# Patient Record
Sex: Male | Born: 1935 | Race: White | Hispanic: No | Marital: Married | Smoking: Former smoker
Health system: Southern US, Community
[De-identification: ages and names within clinical notes are randomized; demographics above are authoritative.]

## PROBLEM LIST (undated history)

## (undated) DIAGNOSIS — R7611 Nonspecific reaction to tuberculin skin test without active tuberculosis: Secondary | ICD-10-CM

## (undated) DIAGNOSIS — H409 Unspecified glaucoma: Secondary | ICD-10-CM

## (undated) DIAGNOSIS — C801 Malignant (primary) neoplasm, unspecified: Secondary | ICD-10-CM

## (undated) DIAGNOSIS — I10 Essential (primary) hypertension: Secondary | ICD-10-CM

## (undated) DIAGNOSIS — M1990 Unspecified osteoarthritis, unspecified site: Secondary | ICD-10-CM

## (undated) DIAGNOSIS — C679 Malignant neoplasm of bladder, unspecified: Secondary | ICD-10-CM

## (undated) DIAGNOSIS — E785 Hyperlipidemia, unspecified: Secondary | ICD-10-CM

## (undated) DIAGNOSIS — T4145XA Adverse effect of unspecified anesthetic, initial encounter: Secondary | ICD-10-CM

## (undated) DIAGNOSIS — D126 Benign neoplasm of colon, unspecified: Secondary | ICD-10-CM

## (undated) DIAGNOSIS — T8859XA Other complications of anesthesia, initial encounter: Secondary | ICD-10-CM

## (undated) DIAGNOSIS — Z87442 Personal history of urinary calculi: Secondary | ICD-10-CM

## (undated) DIAGNOSIS — Z8551 Personal history of malignant neoplasm of bladder: Secondary | ICD-10-CM

## (undated) DIAGNOSIS — E213 Hyperparathyroidism, unspecified: Secondary | ICD-10-CM

## (undated) DIAGNOSIS — M797 Fibromyalgia: Secondary | ICD-10-CM

## (undated) DIAGNOSIS — K5790 Diverticulosis of intestine, part unspecified, without perforation or abscess without bleeding: Secondary | ICD-10-CM

## (undated) HISTORY — DX: Hyperlipidemia, unspecified: E78.5

## (undated) HISTORY — DX: Unspecified glaucoma: H40.9

## (undated) HISTORY — DX: Diverticulosis of intestine, part unspecified, without perforation or abscess without bleeding: K57.90

## (undated) HISTORY — PX: COLONOSCOPY W/ BIOPSIES: SHX1374

## (undated) HISTORY — DX: Nonspecific reaction to tuberculin skin test without active tuberculosis: R76.11

## (undated) HISTORY — PX: HEMORRHOID BANDING: SHX5850

## (undated) HISTORY — PX: NASAL SINUS SURGERY: SHX719

## (undated) HISTORY — DX: Essential (primary) hypertension: I10

## (undated) HISTORY — DX: Benign neoplasm of colon, unspecified: D12.6

## (undated) HISTORY — DX: Fibromyalgia: M79.7

## (undated) HISTORY — PX: CYST EXCISION: SHX5701

## (undated) HISTORY — DX: Unspecified osteoarthritis, unspecified site: M19.90

---
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
## 1970-03-20 HISTORY — PX: VASECTOMY: SHX75

## 1993-03-20 DIAGNOSIS — M797 Fibromyalgia: Secondary | ICD-10-CM

## 1993-03-20 HISTORY — DX: Fibromyalgia: M79.7

## 2003-03-21 HISTORY — PX: APPENDECTOMY: SHX54

## 2004-02-02 ENCOUNTER — Ambulatory Visit: Payer: Self-pay | Admitting: Gastroenterology

## 2004-02-16 ENCOUNTER — Ambulatory Visit: Payer: Self-pay | Admitting: Gastroenterology

## 2004-04-25 ENCOUNTER — Ambulatory Visit: Payer: Self-pay | Admitting: General Surgery

## 2009-08-26 ENCOUNTER — Encounter (INDEPENDENT_AMBULATORY_CARE_PROVIDER_SITE_OTHER): Payer: Self-pay | Admitting: *Deleted

## 2009-08-31 ENCOUNTER — Ambulatory Visit: Payer: Self-pay | Admitting: Internal Medicine

## 2009-09-14 ENCOUNTER — Ambulatory Visit: Payer: Self-pay | Admitting: Internal Medicine

## 2010-03-20 HISTORY — PX: CATARACT EXTRACTION, BILATERAL: SHX1313

## 2010-04-20 NOTE — Procedures (Signed)
Summary: Colonoscopy  Patient: [name] Note: All result statuses are Final unless otherwise noted.  Tests: (1) Colonoscopy (COL)   COL Colonoscopy           DONE     [facility]     [address]     [place], [place]  [address]           COLONOSCOPY PROCEDURE REPORT           PATIENT:  Aaron Quinn, Aaron Quinn  MR#:  [id number]     BIRTHDATE:  23-Dec-1931, 77 yrs. old  GENDER:  male     ENDOSCOPIST:  Iva Boop, MD, [facility]           PROCEDURE DATE:  09/14/2009     PROCEDURE:  Colonoscopy 19[address]     ASA CLASS:  Class II     INDICATIONS:  surveillance and high-risk screening, history of     pre-cancerous (adenomatous) colon polyps first adenoma (1.5 cm)     1985, subsequent adenomas 2003, none in 2005           maternal grandmother with colon cancer     MEDICATIONS:   Fentanyl 50 mcg IV, Versed 5 mg IV           DESCRIPTION OF PROCEDURE:   After the risks benefits and     alternatives of the procedure were thoroughly explained, informed     consent was obtained.  Digital rectal exam was performed and     revealed no abnormalities and normal prostate.   The LB CF-H180AL     [id number] endoscope was introduced through the anus and advanced to     the cecum, which was identified by both the appendix and ileocecal     valve, without limitations.  The quality of the prep was     excellent, using MoviPrep.  The instrument was then slowly     withdrawn as the colon was fully examined.     Insertion: 3:52 minutes withdrawal: 8:40 minutes     <<PROCEDUREIMAGES>>           FINDINGS:  Moderate diverticulosis was found in the sigmoid colon.     This was otherwise a normal examination of the colon. This     includes right colon retroflexion.   Retroflexed views in the     rectum revealed internal hemorrhoids.    The scope was then     withdrawn from the patient and the procedure completed.           COMPLICATIONS:  None     ENDOSCOPIC IMPRESSION:     1) Moderate diverticulosis in  the sigmoid colon     2) Internal hemorrhoids     3) Otherwise normal examination, excellent prep     4) Personal history of adenoma removal 1985 and 2003 (at least)           REPEAT EXAM:  In 5 year(s) for routine screening colonocsopy.           Iva Boop, MD, [facility]           CC:  Loma Sender, MD     The Patient           n.     eSIGNED:   Iva Boop at 09/14/2009 09:00 AM           Nicanor Bake, [id number]  Note: An exclamation mark (!) indicates a result that was not dispersed into the  flowsheet. Document Creation Date: 09/14/2009 9:01 AM _______________________________________________________________________  (1) Order result status: Final Collection or observation date-time: 09/14/2009 08:51 Requested date-time:  Receipt date-time:  Reported date-time:  Referring Physician:   Ordering Physician: Stan Head [phone]) Specimen Source:  Source: Launa Grill Order Number: [id number] Lab site:   Appended Document: Colonoscopy     Procedures Next Due Date:    Colonoscopy: 08/2014

## 2010-04-20 NOTE — Miscellaneous (Signed)
Summary: [facility]  Clinical Lists Changes  Medications: Added new medication of MOVIPREP 100 GM  SOLR (PEG-KCL-NACL-NASULF-NA ASC-C) As per prep instructions. - Signed Rx of MOVIPREP 100 GM  SOLR (PEG-KCL-NACL-NASULF-NA ASC-C) As per prep instructions.;  #1 x 0;  Signed;  Entered by: Ezra Sites RN;  Authorized by: Iva Boop MD, [facility];  Method used: Electronically to [id number] [address], [place], [place]  [address], Ph: [phone], Fax: [phone] Observations: Added new observation of NKA: T (08/31/2009 12:49)    Prescriptions: MOVIPREP 100 GM  SOLR (PEG-KCL-NACL-NASULF-NA ASC-C) As per prep instructions.  #1 x 0   Entered by:   Ezra Sites RN   Authorized by:   Iva Boop MD, [facility]   Signed by:   Ezra Sites RN on 08/31/2009   Method used:   Electronically to        [id number] ([facility])       [address]       [place], [place]  [address]       Ph: [phone]       Fax: [phone]   RxID:   [id number]

## 2010-04-20 NOTE — Letter (Signed)
Summary: Aaron Quinn [facility] Instructions  [facility]  [address] [place], [place] [address]   Phone: [phone]  Fax: [phone]       Aaron Quinn    [birth year]    MRN: [id number]        Procedure Day [name]:  [name]  09/14/09     Arrival Time:  7:30AM      Procedure Time:  8:30AM     Location of Procedure:                    [name] (4th Floor)                       PREPARATION FOR COLONOSCOPY WITH MOVIPREP   Starting 5 days prior to your procedure 09/09/09 do not eat nuts, seeds, popcorn, corn, beans, peas,  salads, or any raw vegetables.  Do not take any fiber supplements (e.g. Metamucil, Citrucel, and Benefiber).  THE DAY BEFORE YOUR PROCEDURE         DATE: 09/13/09  DAY: MONDAY  1.  Drink clear liquids the entire day-NO SOLID FOOD  2.  Do not drink anything colored red or purple.  Avoid juices with pulp.  No orange juice.  3.  Drink at least 64 oz. (8 glasses) of fluid/clear liquids during the day to prevent dehydration and help the prep work efficiently.  CLEAR LIQUIDS INCLUDE: Water Jello Ice Popsicles Tea (sugar ok, no milk/cream) Powdered fruit flavored drinks Coffee (sugar ok, no milk/cream) Gatorade Juice: apple, white grape, white cranberry  Lemonade Clear bullion, consomm, broth Carbonated beverages (any kind) Strained chicken noodle soup Hard Candy                             4.  In the morning, mix first dose of MoviPrep solution:    Empty 1 Pouch A and 1 Pouch B into the disposable container    Add lukewarm drinking water to the top line of the container. Mix to dissolve    Refrigerate (mixed solution should be used within 24 hrs)  5.  Begin drinking the prep at 5:00 p.m. The MoviPrep container is divided by 4 marks.   Every 15 minutes drink the solution down to the next mark (approximately 8 oz) until the full liter is complete.   6.  Follow completed prep with 16 oz of clear liquid of your choice (Nothing  red or purple).  Continue to drink clear liquids until bedtime.  7.  Before going to bed, mix second dose of MoviPrep solution:    Empty 1 Pouch A and 1 Pouch B into the disposable container    Add lukewarm drinking water to the top line of the container. Mix to dissolve    Refrigerate  THE DAY OF YOUR PROCEDURE      DATE: 09/14/09  DAY: TUESDAY  Beginning at 3:30AM (5 hours before procedure):         1. Every 15 minutes, drink the solution down to the next mark (approx 8 oz) until the full liter is complete.  2. Follow completed prep with 16 oz. of clear liquid of your choice.    3. You may drink clear liquids until 6:30AM (2 HOURS BEFORE PROCEDURE).   MEDICATION INSTRUCTIONS  Unless otherwise instructed, you should take regular prescription medications with a small sip of water   as early as possible the morning  of your procedure.           OTHER INSTRUCTIONS  You will need a responsible adult at least 75 years of age to accompany you and drive you home.   This person must remain in the waiting room during your procedure.  Wear loose fitting clothing that is easily removed.  Leave jewelry and other valuables at home.  However, you may wish to bring a book to read or  an iPod/MP3 player to listen to music as you wait for your procedure to start.  Remove all body piercing jewelry and leave at home.  Total time from sign-in until discharge is approximately 2-3 hours.  You should go home directly after your procedure and rest.  You can resume normal activities the  day after your procedure.  The day of your procedure you should not:   Drive   Make legal decisions   Operate machinery   Drink alcohol   Return to work  You will receive specific instructions about eating, activities and medications before you leave.    The above instructions have been reviewed and explained to me by   Ezra Sites RN  August 31, 2009 1:28 PM    I fully understand and can  verbalize these instructions _____________________________ Date _________

## 2010-07-19 ENCOUNTER — Ambulatory Visit: Payer: Self-pay | Admitting: Ophthalmology

## 2010-07-25 ENCOUNTER — Ambulatory Visit: Payer: Self-pay | Admitting: Ophthalmology

## 2010-09-19 ENCOUNTER — Ambulatory Visit: Payer: Self-pay | Admitting: Ophthalmology

## 2011-03-16 ENCOUNTER — Ambulatory Visit: Payer: Self-pay | Admitting: Unknown Physician Specialty

## 2011-03-31 ENCOUNTER — Ambulatory Visit: Payer: Self-pay | Admitting: Unknown Physician Specialty

## 2011-05-24 ENCOUNTER — Encounter: Payer: Self-pay | Admitting: Unknown Physician Specialty

## 2011-06-19 ENCOUNTER — Encounter: Payer: Self-pay | Admitting: Unknown Physician Specialty

## 2012-03-20 HISTORY — PX: KNEE ARTHROSCOPY: SUR90

## 2013-02-02 ENCOUNTER — Emergency Department: Payer: Self-pay | Admitting: Emergency Medicine

## 2013-02-02 LAB — CBC WITH DIFFERENTIAL/PLATELET
Basophil %: 0.5 %
HCT: 46.9 % (ref 40.0–52.0)
HGB: 16 g/dL (ref 13.0–18.0)
Lymphocyte %: 6.5 %
MCH: 31.1 pg (ref 26.0–34.0)
MCHC: 34 g/dL (ref 32.0–36.0)
Neutrophil %: 88 %
WBC: 13.5 10*3/uL — ABNORMAL HIGH (ref 3.8–10.6)

## 2013-02-02 LAB — BASIC METABOLIC PANEL
Anion Gap: 7 (ref 7–16)
Co2: 29 mmol/L (ref 21–32)
EGFR (African American): >60
EGFR (Non-African Amer.): >60
Glucose: 104 mg/dL — ABNORMAL HIGH (ref 65–99)
Osmolality: 279 (ref 275–301)
Potassium: 3.7 mmol/L (ref 3.5–5.1)
Sodium: 139 mmol/L (ref 136–145)

## 2013-02-02 LAB — URINALYSIS, COMPLETE
Bilirubin,UR: NEGATIVE
Blood: NEGATIVE
Glucose,UR: NEGATIVE mg/dL (ref 0–75)
Protein: NEGATIVE
WBC UR: 1 /HPF (ref 0–5)

## 2013-04-13 ENCOUNTER — Other Ambulatory Visit: Payer: Self-pay | Admitting: Podiatry

## 2013-12-16 DIAGNOSIS — G479 Sleep disorder, unspecified: Secondary | ICD-10-CM | POA: Insufficient documentation

## 2014-01-02 ENCOUNTER — Other Ambulatory Visit: Payer: Self-pay | Admitting: Podiatry

## 2014-01-27 ENCOUNTER — Other Ambulatory Visit: Payer: Self-pay | Admitting: *Deleted

## 2014-01-27 MED ORDER — ALLOPURINOL 300 MG PO TABS: 300.0000 mg | ORAL_TABLET | Freq: Every day | ORAL | Status: DC

## 2014-01-27 NOTE — Telephone Encounter (Signed)
[facility] sent refill for allopurinol tabs #30 with 3 refills one by mouth daily. Per dr Paulla Dolly refill.

## 2014-06-09 ENCOUNTER — Encounter: Payer: Self-pay | Admitting: Internal Medicine

## 2014-08-12 ENCOUNTER — Ambulatory Visit (INDEPENDENT_AMBULATORY_CARE_PROVIDER_SITE_OTHER): Payer: Medicare Other | Admitting: Internal Medicine

## 2014-08-12 ENCOUNTER — Encounter: Payer: Self-pay | Admitting: Internal Medicine

## 2014-08-12 VITALS — BP 130/68 | HR 76 | Ht 71.0 in | Wt 173.1 lb

## 2014-08-12 DIAGNOSIS — K648 Other hemorrhoids: Secondary | ICD-10-CM | POA: Diagnosis not present

## 2014-08-12 DIAGNOSIS — R195 Other fecal abnormalities: Secondary | ICD-10-CM

## 2014-08-12 MED ORDER — RANITIDINE HCL 150 MG PO TABS: 150.0000 mg | ORAL_TABLET | Freq: Every day | ORAL | Status: DC

## 2014-08-12 NOTE — Progress Notes (Signed)
   Subjective:    Patient ID: Aaron Quinn, male    DOB: 28-Jan-1932, 81 y.o.   MRN: [id number] Cc: heme + stool HPI Elderly wm w/ hx colon polyps but none 2011 exam. Has been  Heme + DRE.  Has hemorrhoids that swell and burn and sees blood on toilet paper about 1x/week. No Known Allergies Outpatient Prescriptions Prior to Visit  Medication Sig Dispense Refill  . allopurinol (ZYLOPRIM) 300 MG tablet Take 1 tablet (300 mg total) by mouth daily. 90 tablet 3   No facility-administered medications prior to visit.   Past Medical History  Diagnosis Date  . Diverticulosis   . Tubulovillous adenoma of colon   . Fibromyalgia 1995   Past Surgical History  Procedure Laterality Date  . Colonoscopy w/ biopsies     History   Social History  . Marital Status: Married    Spouse Name: N/A  . Number of Children: 2  . Years of Education: N/A   Occupational History  . Retired    Social History Main Topics  . Smoking status: Former Smoker -- 0.50 packs/day for 20 years    Types: Cigarettes    Quit date: 03/20/1982  . Smokeless tobacco: Never Used  . Alcohol Use: No  . Drug Use: No  . Sexual Activity: Not on file   Other Topics Concern  . None   Social History Narrative   Retired from [employer]   Family History  Problem Relation Age of Onset  . Colon cancer Maternal Grandmother 35    Died at 80  . Colon polyps Neg Hx   . Diabetes Neg Hx   . Kidney disease Neg Hx   . Esophageal cancer Neg Hx   . Gallbladder disease Neg Hx   . Heart disease Father        Review of Systems Functional at his age - overall vigorous.     Objective:   Physical Exam BP 130/68 mmHg  Pulse 76  Ht 5\' 11"  (1.803 m)  Wt 173 lb 2 oz (78.529 kg)  BMI 24.16 kg/m2  Rectal : + tags, no mass, NL canal, firm brown stool  Anoscopy: Gr 2 internal hemorrhoids all 3 positions          Assessment & Plan:  Heme positive stool  Hemorrhoids, internal, with bleeding   PROCEDURE  NOTE: The patient presents with symptomatic grade 2 hemorrhoids, requesting rubber band ligation of his/her hemorrhoidal disease.  All risks, benefits and alternative forms of therapy were described and informed consent was obtained.    The decision was made to band the RA internal hemorrhoid, and the [place] was used to perform band ligation without complication.  Digital anorectal examination was then performed to assure proper positioning of the band, and to adjust the banded tissue as required.  The patient was discharged home without pain or other issues.  Dietary and behavioral recommendations were given and along with follow-up instructions.     The following adjunctive treatments were recommended:  Benefiber 2 tbsp/day  The patient will return in June for  follow-up and possible additional banding as required. No complications were encountered and the patient tolerated the procedure well.  I think hemorrhoids most likely cause of heme + stool. Plan to treat the hemorrhoids and then consider next steps. 89 is getting old for a colonoscopy but he is still vigorous. Consider Cologuard testing.  TK:[id number] Lysbeth Galas, MD

## 2014-08-12 NOTE — Patient Instructions (Addendum)
   Stop meloxicam. If ok without it stay off. If you think you need it take omeprazole 20 mg or lansoprazole 15 mg daily with it to reduce risk of ulcer.  HEMORRHOID BANDING PROCEDURE    FOLLOW-UP CARE   1. The procedure you have had should have been relatively painless since the banding of the area involved does not have nerve endings and there is no pain sensation.  The rubber band cuts off the blood supply to the hemorrhoid and the band may fall off as soon as 48 hours after the banding (the band may occasionally be seen in the toilet bowl following a bowel movement). You may notice a temporary feeling of fullness in the rectum which should respond adequately to plain Tylenol or Motrin.  2. Following the banding, avoid strenuous exercise that evening and resume full activity the next day.  A sitz bath (soaking in a warm tub) or bidet is soothing, and can be useful for cleansing the area after bowel movements.     3. To avoid constipation, take two tablespoons of natural wheat bran, natural oat bran, flax, Benefiber or any over the counter fiber supplement and increase your water intake to 7-8 glasses daily.    4. Unless you have been prescribed anorectal medication, do not put anything inside your rectum for two weeks: No suppositories, enemas, fingers, etc.  5. Occasionally, you may have more bleeding than usual after the banding procedure.  This is often from the untreated hemorrhoids rather than the treated one.  Don't be concerned if there is a tablespoon or so of blood.  If there is more blood than this, lie flat with your bottom higher than your head and apply an ice pack to the area. If the bleeding does not stop within a half an hour or if you feel faint, call our office at [phone] or go to the emergency room.  6. Problems are not common; however, if there is a substantial amount of bleeding, severe pain, chills, fever or difficulty passing urine (very rare) or other  problems, you should call us at [phone] or report to the nearest emergency room.  7. Do not stay seated continuously for more than 2-3 hours for a day or two after the procedure.  Tighten your buttock muscles 10-15 times every two hours and take 10-15 deep breaths every 1-2 hours.  Do not spend more than a few minutes on the toilet if you cannot empty your bowel; instead re-visit the toilet at a later time.    Please use Benefiber daily, handout provided.  Follow up with Dr. Carlean Purl 09/02/14 at 9:15AM.   I appreciate the opportunity to care for you. Silvano Rusk, M.D., [facility]

## 2014-08-15 NOTE — Assessment & Plan Note (Signed)
RA banded - RTC June for reassessment and banding

## 2014-09-02 ENCOUNTER — Encounter: Payer: Self-pay | Admitting: Internal Medicine

## 2014-09-02 ENCOUNTER — Ambulatory Visit (INDEPENDENT_AMBULATORY_CARE_PROVIDER_SITE_OTHER): Payer: Medicare Other | Admitting: Internal Medicine

## 2014-09-02 VITALS — BP 136/80 | HR 84 | Ht 71.0 in | Wt 176.0 lb

## 2014-09-02 DIAGNOSIS — K648 Other hemorrhoids: Secondary | ICD-10-CM | POA: Diagnosis not present

## 2014-09-02 NOTE — Progress Notes (Signed)
   Improved sxs - easier to cleanse, easier defecation  Desires 2 bands today  PROCEDURE NOTE: The patient presents with symptomatic grade  2 hemorrhoids, requesting rubber band ligation of his/her hemorrhoidal disease.  All risks, benefits and alternative forms of therapy were described and informed consent was obtained.   The anorectum was pre-medicated with 0.125% NTG and 5% lidocaine The decision was made to band the RP and LL internal hemorrhoid, and the [place] was used to perform band ligation without complication.  Digital anorectal examination was then performed to assure proper positioning of the band, and to adjust the banded tissue as required.  The patient was discharged home without pain or other issues.  Dietary and behavioral recommendations were given and along with follow-up instructions.     The following adjunctive treatments were recommended:  Continue Benefiber  The patient will return in 2 months for  follow-up and possible additional banding as required. No complications were encountered and the patient tolerated the procedure well.  Consider Cologuard vs colonoscopy when he returns

## 2014-09-02 NOTE — Assessment & Plan Note (Signed)
RP and LL banded Continue Benefiber RTC 2 months and consider colon evaluation with Cologuard vs colonoscopy

## 2014-09-02 NOTE — Patient Instructions (Signed)
HEMORRHOID BANDING PROCEDURE    FOLLOW-UP CARE   1. The procedure you have had should have been relatively painless since the banding of the area involved does not have nerve endings and there is no pain sensation.  The rubber band cuts off the blood supply to the hemorrhoid and the band may fall off as soon as 48 hours after the banding (the band may occasionally be seen in the toilet bowl following a bowel movement). You may notice a temporary feeling of fullness in the rectum which should respond adequately to plain Tylenol or Motrin.  2. Following the banding, avoid strenuous exercise that evening and resume full activity the next day.  A sitz bath (soaking in a warm tub) or bidet is soothing, and can be useful for cleansing the area after bowel movements.     3. To avoid constipation, take two tablespoons of natural wheat bran, natural oat bran, flax, Benefiber or any over the counter fiber supplement and increase your water intake to 7-8 glasses daily.    4. Unless you have been prescribed anorectal medication, do not put anything inside your rectum for two weeks: No suppositories, enemas, fingers, etc.  5. Occasionally, you may have more bleeding than usual after the banding procedure.  This is often from the untreated hemorrhoids rather than the treated one.  Don't be concerned if there is a tablespoon or so of blood.  If there is more blood than this, lie flat with your bottom higher than your head and apply an ice pack to the area. If the bleeding does not stop within a half an hour or if you feel faint, call our office at [phone] or go to the emergency room.  6. Problems are not common; however, if there is a substantial amount of bleeding, severe pain, chills, fever or difficulty passing urine (very rare) or other problems, you should call us at [phone] or report to the nearest emergency room.  7. Do not stay seated continuously for more than 2-3 hours for a day or two  after the procedure.  Tighten your buttock muscles 10-15 times every two hours and take 10-15 deep breaths every 1-2 hours.  Do not spend more than a few minutes on the toilet if you cannot empty your bowel; instead re-visit the toilet at a later time.    Follow up with Dr Gessner in 2 months.   I appreciate the opportunity to care for you. Carl Gessner, MD, FACG 

## 2014-11-04 ENCOUNTER — Ambulatory Visit (INDEPENDENT_AMBULATORY_CARE_PROVIDER_SITE_OTHER): Payer: Medicare Other | Admitting: Internal Medicine

## 2014-11-04 ENCOUNTER — Encounter: Payer: Self-pay | Admitting: Internal Medicine

## 2014-11-04 VITALS — BP 118/60 | HR 80 | Ht 71.0 in | Wt 171.0 lb

## 2014-11-04 DIAGNOSIS — K648 Other hemorrhoids: Secondary | ICD-10-CM

## 2014-11-04 DIAGNOSIS — Z1211 Encounter for screening for malignant neoplasm of colon: Secondary | ICD-10-CM | POA: Diagnosis not present

## 2014-11-04 NOTE — Progress Notes (Signed)
   Subjective:    Patient ID: Aaron Quinn, male    DOB: 1931-12-25, 81 y.o.   MRN: [id number] Cc: f/u hemorrhoids and discuss colon cancer screenig HPI  No further hemorrhoid problems, much easier to wipe and cleanse after defecation. To discuss CRCA screening Medications, allergies, past medical history, past surgical history, family history and social history are reviewed and updated in the EMR.   Review of Systems As above    Objective:   Physical Exam  BP 118/60 mmHg  Pulse 80  Ht 5\' 11"  (1.803 m)  Wt 171 lb (77.565 kg)  BMI 23.86 kg/m2 NAD, younger than stated    Assessment & Plan:   Hemorrhoids, internal, with bleeding  Colon cancer screening  1) Has decided to pursue Cologuard testing 2) F/U prnhemorrhoids  [id number] Lysbeth Galas, MD

## 2014-11-04 NOTE — Patient Instructions (Signed)
Today we are ordering a cologuard test for you, we are providing you with handouts and they will contact you about shipping the kit to you.   I appreciate the opportunity to care for you. Silvano Rusk, MD, [facility]

## 2014-11-04 NOTE — Assessment & Plan Note (Addendum)
Sx resolved

## 2014-11-05 ENCOUNTER — Encounter: Payer: Self-pay | Admitting: Internal Medicine

## 2014-11-10 ENCOUNTER — Encounter: Payer: Self-pay | Admitting: Internal Medicine

## 2014-11-19 LAB — COLOGUARD

## 2014-11-24 ENCOUNTER — Encounter: Payer: Self-pay | Admitting: Internal Medicine

## 2015-02-17 ENCOUNTER — Other Ambulatory Visit: Payer: Self-pay | Admitting: Podiatry

## 2015-03-03 DIAGNOSIS — I10 Essential (primary) hypertension: Secondary | ICD-10-CM | POA: Insufficient documentation

## 2015-03-09 ENCOUNTER — Encounter: Payer: Self-pay | Admitting: Family Medicine

## 2015-03-09 ENCOUNTER — Ambulatory Visit (INDEPENDENT_AMBULATORY_CARE_PROVIDER_SITE_OTHER): Payer: Medicare Other | Admitting: Family Medicine

## 2015-03-09 VITALS — BP 126/84 | HR 89 | Temp 98.0°F | Ht 71.0 in

## 2015-03-09 DIAGNOSIS — R42 Dizziness and giddiness: Secondary | ICD-10-CM | POA: Diagnosis not present

## 2015-03-09 DIAGNOSIS — I10 Essential (primary) hypertension: Secondary | ICD-10-CM | POA: Insufficient documentation

## 2015-03-09 DIAGNOSIS — R413 Other amnesia: Secondary | ICD-10-CM | POA: Diagnosis not present

## 2015-03-09 DIAGNOSIS — M797 Fibromyalgia: Secondary | ICD-10-CM | POA: Diagnosis not present

## 2015-03-09 NOTE — Progress Notes (Signed)
Pre visit review using our clinic review tool, if applicable. No additional management support is needed unless otherwise documented below in the visit note. 

## 2015-03-09 NOTE — Patient Instructions (Signed)
Nice to meet you.   please continue current medication regimen  please monitor the lightheadedness. If this gets worse or he develop other symptoms please let us know.  please monitor  Your blood pressure.  If you develop lightheadedness, dizziness, chest pain, shortness of breath, palpitations, numbness, weakness, increasing pain, or any new or changing symptoms please seek medical attention.

## 2015-03-09 NOTE — Assessment & Plan Note (Signed)
Suspect this is likely orthostatic in nature given that it mostly occurs when he goes from rising upright. Orthostatics today were negative. Could potentially also be related to medications that he is on for his fibromyalgia. I discussed this with him. No associated cardiac symptoms. He would like to continue to monitor this. I advised that he should go from sitting or laying to standing very slowly. He should remain well hydrated. If he develops any other symptoms he will let us know. We will request records from his ED visit 2014 for syncope. He has had no additional issues with passing out since that time. He is given return precautions.

## 2015-03-09 NOTE — Progress Notes (Signed)
Patient ID: OAKLEE MOAK, male   DOB: Jun 02, 1931, 81 y.o.   MRN: [id number]  [name], MD Phone: [phone]  Aaron Quinn is a 81 y.o. male who presents today for the patient visit.  Fibromyalgia: Patient notes he has had 25 years with symptoms of this. Noted this first started out as an inability to sleep. Notes several years ago muscular aches started. Notes he has aches all over. Notes he aches lightly all the time, though if he strains his muscles for several hours he will ache even more. He notes he overall feels weak when he strains his muscles. He is currently on gabapentin, citalopram, nortriptyline, and Requip. He is followed by neurology for this. He notes these medications lessen symptoms. He additionally notes some minimal numbness in his toes and feet when he does too much straining. Notes it is tingling and numbing sensation. Has had this since he started on medications to 4 years ago. He does not have numbness elsewhere. He has no focal weakness.  Lightheadedness: Patient notes this comes on when "he works hard." He then stated it only comes on when he goes from seated to standing or when he bends over and stands up. He notes it is a slight lightheadedness. He notes this started when he started the medications for his fibromyalgia. He does not have vertigo. He does not have any chest pain, shortness breath, or palpitations with it. He has not had a syncopal event recently. He does note about 2 years ago he passed out while at church. He notes he had gone from seated to standing. He states he was evaluated at [facility] and was told that it was due to dehydration. He has not had any issues with this since then. He notes prior to passing out he had hot flashes and then chills.  Memory issues: Patient notes he has issues remembering medication names and names of other people. He can report faces. He is able to accomplish all his ADLs and IADLs with no issues. He notes this is not much  of an issue for him.  HYPERTENSION Disease Monitoring Home BP Monitoring states blood pressure systolically is typically 120-140 Chest pain- no    Dyspnea- no Medications Compliance-  takes clonidine and HCTZ. Lightheadedness-  yes, see above  Edema- no    Active Ambulatory Problems    Diagnosis Date Noted  . Hemorrhoids, internal, with bleeding 08/12/2014  . Fibromyalgia 03/09/2015  . Lightheadedness 03/09/2015  . Memory deficit 03/09/2015  . Essential hypertension 03/09/2015   Resolved Ambulatory Problems    Diagnosis Date Noted  . No Resolved Ambulatory Problems   Past Medical History  Diagnosis Date  . Diverticulosis   . Tubulovillous adenoma of colon   . Arthritis   . Glaucoma   . Hypertension   . Hyperlipidemia   . Kidney stones   . Positive TB test     Family History  Problem Relation Age of Onset  . Colon cancer Maternal Grandmother 37    Died at 63  . Colon polyps Neg Hx   . Diabetes Neg Hx   . Kidney disease Neg Hx   . Esophageal cancer Neg Hx   . Gallbladder disease Neg Hx   . Heart disease Father   . Arthritis      Parent  . Hypertension      Parent, grandparent    Social History   Social History  . Marital Status: Married    Spouse Name: N/A  .  Number of Children: 2  . Years of Education: N/A   Occupational History  . Retired    Social History Main Topics  . Smoking status: Former Smoker -- 0.50 packs/day for 20 years    Types: Cigarettes    Quit date: 03/20/1982  . Smokeless tobacco: Never Used  . Alcohol Use: No  . Drug Use: No  . Sexual Activity: Not on file   Other Topics Concern  . Not on file   Social History Narrative   Retired from [employer]:  Negative for unexplained weight loss, fever Skin: Negative for new or changing mole, sore that won't heal HEENT: Negative for trouble hearing, trouble seeing, ringing in ears, mouth sores, hoarseness, change in voice, dysphagia. CV:  Positive for  lightheadedness, Negative for chest pain, dyspnea, edema, palpitations Resp: Negative for cough, dyspnea, hemoptysis GI: Negative for nausea, vomiting, diarrhea, constipation, abdominal pain, melena, hematochezia. GU: Positive for sexual difficulty, Negative for dysuria, incontinence, urinary hesitance, hematuria, vaginal or penile discharge, polyuria, lumps in testicle or breasts MSK: Negative for muscle cramps or aches, joint pain or swelling Neuro: Positive for numbness, weakness, Negative for headaches, dizziness, passing out/fainting Psych: Negative for depression, anxiety, positive for memory problems  Objective  Physical Exam Filed Vitals:   03/09/15 0923  BP: 126/84  Pulse: 89  Temp: 98 F (36.7 C)   Laying blood pressure 132/86 pulse 80 Sitting blood pressure 148/84 pulse 86 Standing blood pressure 138/82 pulse 95 Patient noted he got mildly lightheaded on going from laying to sitting   Physical Exam  Constitutional: He is well-developed, well-nourished, and in no distress.  HENT:  Head: Normocephalic and atraumatic.  Right Ear: External ear normal.  Left Ear: External ear normal.  Mouth/Throat: Oropharynx is clear and moist. No oropharyngeal exudate.  Eyes: Conjunctivae are normal. Pupils are equal, round, and reactive to light.  Neck: Neck supple.  Cardiovascular: Normal rate, regular rhythm and normal heart sounds.   No carotid bruits  Pulmonary/Chest: Effort normal and breath sounds normal. No respiratory distress.  Abdominal: Soft. Bowel sounds are normal. He exhibits no distension. There is no tenderness. There is no rebound and no guarding.  Lymphadenopathy:    He has no cervical adenopathy.  Neurological: He is alert.  CN 2-12 intact, 5/5 strength in bilateral biceps, triceps, grip, quads, hamstrings, plantar and dorsiflexion, sensation to light touch intact in bilateral UE and LE, normal gait, 2+ patellar reflexes  Skin: Skin is warm and dry. He is not  diaphoretic.  Psychiatric: Mood and affect normal.     Assessment/Plan:   Fibromyalgia Patient followed by neurology for this issue. Suspect aching in body and numbness and tingling in his feet are related to this. Symptoms are relatively well controlled on his current medication regimen. He is neurologically intact. He'll continue to follow with neurology for this. Given return precautions.  Lightheadedness Suspect this is likely orthostatic in nature given that it mostly occurs when he goes from rising upright. Orthostatics today were negative. Could potentially also be related to medications that he is on for his fibromyalgia. I discussed this with him. No associated cardiac symptoms. He would like to continue to monitor this. I advised that he should go from sitting or laying to standing very slowly. He should remain well hydrated. If he develops any other symptoms he will let us know. We will request records from his ED visit 2014 for syncope. He has had no additional issues  with passing out since that time. He is given return precautions.  Memory deficit Patient reports mild memory deficit with regards to remembering other people's names and his medication names. Doubt any significant cognitive issues given that he can perform all his ADLs and IADLs. We will continue to monitor this.  Essential hypertension At goal today. Continue current medicines. We'll request records from his prior PCP.   Dragon [employer] forces training and education officer was used during the dictation process of this note. If any phrases or words seem inappropriate it is likely secondary to the translation process being inefficient.  [name]

## 2015-03-09 NOTE — Assessment & Plan Note (Signed)
Patient reports mild memory deficit with regards to remembering other people's names and his medication names. Doubt any significant cognitive issues given that he can perform all his ADLs and IADLs. We will continue to monitor this.

## 2015-03-09 NOTE — Assessment & Plan Note (Signed)
Patient followed by neurology for this issue. Suspect aching in body and numbness and tingling in his feet are related to this. Symptoms are relatively well controlled on his current medication regimen. He is neurologically intact. He'll continue to follow with neurology for this. Given return precautions.

## 2015-03-09 NOTE — Assessment & Plan Note (Signed)
At goal today. Continue current medicines. We'll request records from his prior PCP.

## 2015-03-10 ENCOUNTER — Encounter: Payer: Self-pay | Admitting: Podiatry

## 2015-03-10 ENCOUNTER — Ambulatory Visit (INDEPENDENT_AMBULATORY_CARE_PROVIDER_SITE_OTHER): Payer: Medicare Other | Admitting: Podiatry

## 2015-03-10 VITALS — BP 126/84 | HR 89 | Resp 16

## 2015-03-10 DIAGNOSIS — M1990 Unspecified osteoarthritis, unspecified site: Secondary | ICD-10-CM | POA: Insufficient documentation

## 2015-03-10 DIAGNOSIS — M109 Gout, unspecified: Secondary | ICD-10-CM | POA: Insufficient documentation

## 2015-03-10 DIAGNOSIS — I10 Essential (primary) hypertension: Secondary | ICD-10-CM | POA: Insufficient documentation

## 2015-03-10 DIAGNOSIS — M1A0790 Idiopathic chronic gout, unspecified ankle and foot, without tophus (tophi): Secondary | ICD-10-CM | POA: Diagnosis not present

## 2015-03-10 DIAGNOSIS — Z8619 Personal history of other infectious and parasitic diseases: Secondary | ICD-10-CM | POA: Insufficient documentation

## 2015-03-10 DIAGNOSIS — E785 Hyperlipidemia, unspecified: Secondary | ICD-10-CM | POA: Insufficient documentation

## 2015-03-10 MED ORDER — ALLOPURINOL 300 MG PO TABS: 300.0000 mg | ORAL_TABLET | Freq: Every day | ORAL | Status: DC

## 2015-03-10 NOTE — Progress Notes (Signed)
   Subjective:    Patient ID: Aaron Quinn, male    DOB: February 12, 1932, 81 y.o.   MRN: [id number]  HPI: He presents today with a history of gout. He states that currently he is doing just fine but needs a refill on his medication. He states that he takes allopurinol 300 mg 1 by mouth daily. He brings a partially empty bottle with him to confirm this.    Review of Systems  HENT: Positive for sinus pressure and sore throat.   All other systems reviewed and are negative.      Objective:   Physical Exam: 81 year old male vital signs stable alert and oriented 3 in no apparent distress. Vital signs are stable he is alert and oriented pulses are strongly palpable neurologic sensorium is intact per Semmes-Weinstein monofilament. Deep tendon reflexes are intact bilateral and muscle strength was 5 over 5 dorsiflexors plantar flexors and inverters everters all intrinsic musculature is intact. Orthopedic evaluation demonstrates all joints distal to the ankle have full range of motion without crepitation. Cutaneous evaluation demonstrates supple well-hydrated cutis no erythema edema saline as drainage or odor.      Assessment & Plan:  Assessment: History of gout and capsulitis.  Plan: Refilled his allopurinol 300 mg 1 by mouth daily.

## 2015-03-29 ENCOUNTER — Encounter: Payer: Self-pay | Admitting: Family Medicine

## 2015-04-09 ENCOUNTER — Encounter: Payer: Self-pay | Admitting: Family Medicine

## 2015-04-09 ENCOUNTER — Ambulatory Visit (INDEPENDENT_AMBULATORY_CARE_PROVIDER_SITE_OTHER): Payer: Medicare Other | Admitting: Family Medicine

## 2015-04-09 VITALS — BP 128/84 | HR 84 | Temp 97.9°F | Ht 71.0 in | Wt 169.6 lb

## 2015-04-09 DIAGNOSIS — Z0000 Encounter for general adult medical examination without abnormal findings: Secondary | ICD-10-CM | POA: Insufficient documentation

## 2015-04-09 DIAGNOSIS — Z23 Encounter for immunization: Secondary | ICD-10-CM

## 2015-04-09 DIAGNOSIS — I10 Essential (primary) hypertension: Secondary | ICD-10-CM | POA: Diagnosis not present

## 2015-04-09 DIAGNOSIS — Z13220 Encounter for screening for lipoid disorders: Secondary | ICD-10-CM | POA: Diagnosis not present

## 2015-04-09 NOTE — Assessment & Plan Note (Signed)
Overall patient is doing well. Exercises several times a week. Diet appears adequate for somebody with normal BMI. Had flu shot this flu season. Tdap was given today. Declined shingles vaccine. Had pneumonia vaccine several years ago. Is up-to-date on colon cancer screening. Given his age we discussed risks versus benefit of prostate cancer screening and opted not to proceed with prostate cancer screening. We will check a lipid panel given his history of hyperlipidemia and a CMP as well today.

## 2015-04-09 NOTE — Progress Notes (Signed)
Patient ID: Aaron Quinn, male   DOB: 06/27/1931, 82 y.o.   MRN: [id number]  [name], MD Phone: [phone]  Aaron Quinn is a 82 y.o. male who presents today for physical.  Overall patient reports he is doing well. Exercises by work in the yard and walking several times a week.  Diet is ok. Eats most everything. Eats in moderation. 4-5 glasses half and half tea. 2-3 sodas a week. Eats veggies most meals.  Flu shot 12/18/14. Tdap patient believes was done about 10 years ago. Declined shingle shot. Notes he had the pneumonia shot several years ago. Colonoscopy 6 years ago. Notes he is followed up with GI and did a stool test and they made the decision to not do any more colon cancer testing. Has not had his prostate checked in a number of years. No family history of prostate cancer. No urinary symptoms. No alcohol use. No smoking. He is a former smoker and quit 30 years ago. Smoked about a pack a week when he did smoke. No illicit drugs.  Active Ambulatory Problems    Diagnosis Date Noted  . Hemorrhoids, internal, with bleeding 08/12/2014  . Fibromyalgia 03/09/2015  . Lightheadedness 03/09/2015  . Memory deficit 03/09/2015  . Essential hypertension 03/09/2015  . Arthritis 03/10/2015  . Difficulty sleeping 12/16/2013  . Gout 03/10/2015  . H/O varicella 03/10/2015  . HLD (hyperlipidemia) 03/10/2015  . BP (high blood pressure) 03/10/2015  . Essential (primary) hypertension 03/03/2015  . Annual physical exam 04/09/2015   Resolved Ambulatory Problems    Diagnosis Date Noted  . No Resolved Ambulatory Problems   Past Medical History  Diagnosis Date  . Diverticulosis   . Tubulovillous adenoma of colon   . Glaucoma   . Hypertension   . Hyperlipidemia   . Kidney stones   . Positive TB test     Family History  Problem Relation Age of Onset  . Colon cancer Maternal Grandmother 6    Died at 33  . Colon polyps Neg Hx   . Diabetes Neg Hx   . Kidney disease Neg Hx     . Esophageal cancer Neg Hx   . Gallbladder disease Neg Hx   . Heart disease Father   . Arthritis      Parent  . Hypertension      Parent, grandparent    Social History   Social History  . Marital Status: Married    Spouse Name: N/A  . Number of Children: 2  . Years of Education: N/A   Occupational History  . Retired    Social History Main Topics  . Smoking status: Former Smoker -- 0.50 packs/day for 20 years    Types: Cigarettes    Quit date: 03/20/1982  . Smokeless tobacco: Never Used  . Alcohol Use: No  . Drug Use: No  . Sexual Activity: Not on file   Other Topics Concern  . Not on file   Social History Narrative   Retired from [employer]:  Negative for nexplained weight loss, fever Skin: Negative for new or changing mole, sore that won't heal HEENT: Negative for trouble hearing, trouble seeing, ringing in ears, mouth sores, hoarseness, change in voice, dysphagia. CV:  Negative for chest pain, dyspnea, edema, palpitations Resp: Negative for cough, dyspnea, hemoptysis GI: Negative for nausea, vomiting, diarrhea, constipation, abdominal pain, melena, hematochezia. GU: Negative for dysuria, incontinence, urinary hesitance, hematuria, vaginal or penile discharge, polyuria, sexual difficulty, lumps  in testicle or breasts MSK: Negative for muscle cramps or aches, joint pain or swelling Neuro: Negative for headaches, weakness, numbness, dizziness, passing out/fainting Psych: Negative for depression, anxiety, memory problems   Objective  Physical Exam Filed Vitals:   04/09/15 0844  BP: 128/84  Pulse: 84  Temp: 97.9 F (36.6 C)    Physical Exam  Constitutional: He is well-developed, well-nourished, and in no distress.  HENT:  Head: Normocephalic and atraumatic.  Right Ear: External ear normal.  Left Ear: External ear normal.  Mouth/Throat: Oropharynx is clear and moist. No oropharyngeal exudate.  Eyes: Conjunctivae are normal.  Pupils are equal, round, and reactive to light.  Neck: Neck supple.  Cardiovascular: Normal rate, regular rhythm and normal heart sounds.  Exam reveals no gallop and no friction rub.   No murmur heard. Pulmonary/Chest: Effort normal and breath sounds normal. No respiratory distress. He has no wheezes. He has no rales.  Abdominal: Soft. Bowel sounds are normal. He exhibits no distension. There is no tenderness. There is no rebound and no guarding.  Musculoskeletal: He exhibits no edema.  Lymphadenopathy:    He has no cervical adenopathy.  Neurological: He is alert. Gait normal.  Skin: Skin is warm and dry. He is not diaphoretic.  Psychiatric: Mood and affect normal.     Assessment/Plan:   Annual physical exam Overall patient is doing well. Exercises several times a week. Diet appears adequate for somebody with normal BMI. Had flu shot this flu season. Tdap was given today. Declined shingles vaccine. Had pneumonia vaccine several years ago. Is up-to-date on colon cancer screening. Given his age we discussed risks versus benefit of prostate cancer screening and opted not to proceed with prostate cancer screening. We will check a lipid panel given his history of hyperlipidemia and a CMP as well today.    Orders Placed This Encounter  Procedures  . Tdap vaccine greater than or equal to 7yo IM  . Lipid Profile    Standing Status: Future     Number of Occurrences:      Standing Expiration Date: 04/08/2016  . Comp Met (CMET)    Standing Status: Future     Number of Occurrences:      Standing Expiration Date: 04/08/2016   [name]

## 2015-04-09 NOTE — Progress Notes (Signed)
Pre visit review using our clinic review tool, if applicable. No additional management support is needed unless otherwise documented below in the visit note. 

## 2015-04-09 NOTE — Patient Instructions (Signed)
Nice to see you. Please continue to exercise and monitor your diet.  Please schedule an appointment for lab work.

## 2015-04-21 ENCOUNTER — Other Ambulatory Visit: Payer: Self-pay

## 2015-04-21 NOTE — Telephone Encounter (Signed)
Please advise refills.  Acyclovir was not on his list at the office.  You have not refilled at this point any of these medications for him.  Thanks

## 2015-04-23 ENCOUNTER — Other Ambulatory Visit: Payer: Self-pay

## 2015-04-23 ENCOUNTER — Telehealth: Payer: Self-pay | Admitting: *Deleted

## 2015-04-23 MED ORDER — ZOLPIDEM TARTRATE 10 MG PO TABS: 15.0000 mg | ORAL_TABLET | Freq: Every day | ORAL | Status: DC

## 2015-04-23 MED ORDER — CLONIDINE HCL 0.1 MG PO TABS: 0.1000 mg | ORAL_TABLET | Freq: Two times a day (BID) | ORAL | Status: DC

## 2015-04-23 NOTE — Telephone Encounter (Signed)
This med is historical and has not been refilled here. With pt having memory issues, okay to fill? Please advise

## 2015-04-23 NOTE — Telephone Encounter (Signed)
Is the patient taking 15 mg of this or 10 mg? His memory issues were minimal at his first visit.

## 2015-04-23 NOTE — Telephone Encounter (Signed)
Ok to refill. I have printed the prescription. Does he need refills on anything else?

## 2015-04-23 NOTE — Telephone Encounter (Signed)
Attempted to reach patient, left a VM to have him return a call to the office for clarification.

## 2015-04-23 NOTE — Telephone Encounter (Signed)
Patient requested a medication refill for  zolpidem (AMBIEN) 10 MG tablet        [facility]

## 2015-04-23 NOTE — Telephone Encounter (Signed)
Pt states he is taking 15 mg

## 2015-04-23 NOTE — Telephone Encounter (Signed)
Please determine what dose of Lorrin Mais he is taking. Please also determine if he is taking acyclovir as this is not on his medication list. Thanks.

## 2015-04-26 NOTE — Telephone Encounter (Signed)
Attempted to call patient and left a VM to return my call again.

## 2015-04-26 NOTE — Telephone Encounter (Signed)
No he is fine with Rx

## 2015-05-03 ENCOUNTER — Other Ambulatory Visit: Payer: Self-pay

## 2015-05-03 ENCOUNTER — Telehealth: Payer: Self-pay | Admitting: Family Medicine

## 2015-05-03 MED ORDER — HYDROCHLOROTHIAZIDE 25 MG PO TABS: 25.0000 mg | ORAL_TABLET | Freq: Every day | ORAL | Status: DC

## 2015-05-03 NOTE — Telephone Encounter (Signed)
Pt is returning call to office. His wife took the phone call, he does not know why our office called. Please call him at [phone].

## 2015-05-03 NOTE — Telephone Encounter (Signed)
Spoke with the patient and straightened out [facility]

## 2015-05-04 NOTE — Telephone Encounter (Signed)
Patient brought in a letter from [place] that Ambien is not covered on formulary.  Completed a PA over the phone with jennifer, PA representative.  Ambien was denied due to not having tried preferred medication on the formulary.  Patient has been on current therapy since 1997.  Researched through old chart from Dr. Hardin Negus office and started appeal for coverage.  Dr. Caryl Bis agreed and faxed letter to [place] for determination.

## 2015-05-07 NOTE — Telephone Encounter (Signed)
Spoke with the patient, he verbalized understanding of the appeals denial.  He has approximately 45 tablets left, which will last him a month and a half.  He is open to either of the medication options, Rozerem or trazodone.hasn't heard of either of them.  Thanks

## 2015-05-11 NOTE — Telephone Encounter (Signed)
Spoke with patient and he is going to start to taper off of the Ambien on Monday 27th. He wants to know if you will sending in the new RX for him to start as soon as he is off of the Ambien and if you will send in a 90 day supply.

## 2015-05-11 NOTE — Telephone Encounter (Signed)
Given patient duration on this medication he needs to taper off of it prior to discontinuing it. He should take 10 mg daily for a week. Then take 5 mg daily for a week, then discontinue the [place]. We will try him on trazodone once off the [place]. Thanks.

## 2015-05-12 NOTE — Addendum Note (Signed)
Addended by: Leone Haven on: 05/12/2015 08:52 AM   Modules accepted: Orders

## 2015-05-27 ENCOUNTER — Telehealth: Payer: Self-pay

## 2015-05-27 MED ORDER — RAMELTEON 8 MG PO TABS: 8.0000 mg | ORAL_TABLET | Freq: Every day | ORAL | Status: DC

## 2015-05-27 NOTE — Telephone Encounter (Signed)
Given that patient is on nortriptyline and Celexa it would be best not to try trazodone at this time. We will try him on ramelteon which is one of the other covered medications. This will be sent to his pharmacy at [facility] in [place] on [address]. He can start this after stopping the Ambien.

## 2015-05-27 NOTE — Telephone Encounter (Signed)
We'll start on ramelteon. Please see more recent phone note.

## 2015-05-27 NOTE — Telephone Encounter (Signed)
According to the chart Aaron Quinn has been on a taper for his Ambien prescription. Pt states he has about three days left of pills. Pt states that since he has started to taper off the [place] he has noticed a decrease in the amount of rest he is getting. Pt recalls that with taking 15mg  of ambien he was getting about 8hrs of sleep, with 10mg  about 6hrs of sleep, and now that he is taking 5mg  he is getting about 3-4hrs of sleep. It looks like your plans were to start him on trazadone with a request of a 90 day refill. Please advise, thanks

## 2015-05-28 NOTE — Telephone Encounter (Signed)
Notified pt of Dr. Ellen Henri comments pt verbalized understanding.

## 2015-06-01 ENCOUNTER — Other Ambulatory Visit: Payer: Self-pay

## 2015-06-01 MED ORDER — RAMELTEON 8 MG PO TABS: 8.0000 mg | ORAL_TABLET | Freq: Every day | ORAL | Status: DC

## 2015-06-01 NOTE — Telephone Encounter (Signed)
Patient walked in the office.  Concerned that his new prescription was almost 350 dollars for a 30 day supply at the local pharmacy.  He requested it to be sent to [facility] as it will only cost 60 dollars.   Completed his request.

## 2015-06-21 ENCOUNTER — Encounter: Payer: Self-pay | Admitting: Family Medicine

## 2015-06-21 ENCOUNTER — Telehealth: Payer: Self-pay | Admitting: *Deleted

## 2015-06-21 ENCOUNTER — Ambulatory Visit (INDEPENDENT_AMBULATORY_CARE_PROVIDER_SITE_OTHER): Payer: Medicare Other | Admitting: Family Medicine

## 2015-06-21 VITALS — BP 130/64 | HR 61 | Temp 98.4°F | Ht 71.0 in | Wt 172.8 lb

## 2015-06-21 DIAGNOSIS — R52 Pain, unspecified: Secondary | ICD-10-CM

## 2015-06-21 DIAGNOSIS — J069 Acute upper respiratory infection, unspecified: Secondary | ICD-10-CM

## 2015-06-21 LAB — POCT INFLUENZA A/B
INFLUENZA B, POC: NEGATIVE
Influenza A, POC: NEGATIVE

## 2015-06-21 MED ORDER — BENZONATATE 200 MG PO CAPS: 200.0000 mg | ORAL_CAPSULE | Freq: Two times a day (BID) | ORAL | Status: DC | PRN

## 2015-06-21 MED ORDER — PREDNISONE 10 MG PO TABS: ORAL_TABLET | ORAL | Status: DC

## 2015-06-21 MED ORDER — AMOXICILLIN-POT CLAVULANATE 875-125 MG PO TABS: 1.0000 | ORAL_TABLET | Freq: Two times a day (BID) | ORAL | Status: DC

## 2015-06-21 NOTE — Addendum Note (Signed)
Addended by: Durwin Glaze on: 06/21/2015 06:17 PM   Modules accepted: Orders

## 2015-06-21 NOTE — Patient Instructions (Signed)
Nice to see you Your symptoms are likely related to a virus. We will treat this with prednisone and Tessalon for cough. If your symptoms do not begin to improve in the next 2-3 days please fill the antibiotic. If your symptoms worsen or change you should be reevaluated prior to filling antibiotic. If you develop chest pain, shortness of breath, cough productive of blood, worsening headache, numbness, weakness, vision changes, or any new or changing symptoms please seek medical attention.

## 2015-06-21 NOTE — Progress Notes (Signed)
Pre visit review using our clinic review tool, if applicable. No additional management support is needed unless otherwise documented below in the visit note. 

## 2015-06-21 NOTE — Progress Notes (Signed)
Patient ID: Aaron Quinn, male   DOB: [birth year], 82 y.o.   MRN: [id number]  [name], MD Phone: [phone]  Aaron Quinn is a 82 y.o. male who presents today for same-day visit.  Patient notes 5 days of cough and congestion. Notes rhinorrhea. No sinus congestion. He does note headache in the posterior aspect of his head with no vision changes, numbness, or weakness. No ear fullness. No fevers. No shortness of breath. Cough is productive of clear sputum. He is blowing a material out of his nose. His been taking Alka-Seltzer cold and flu. He did get a flu shot.  PMH: Former smoker   ROS see history of present illness  Objective  Physical Exam Filed Vitals:   06/21/15 1403  BP: 130/64  Pulse: 61  Temp: 98.4 F (36.9 C)    BP Readings from Last 3 Encounters:  06/21/15 130/64  04/09/15 128/84  03/10/15 126/84   Wt Readings from Last 3 Encounters:  06/21/15 172 lb 12.8 oz (78.382 kg)  04/09/15 169 lb 9.6 oz (76.93 kg)  11/04/14 171 lb (77.565 kg)    Physical Exam  Constitutional: He is well-developed, well-nourished, and in no distress.  HENT:  Head: Normocephalic and atraumatic.  Right Ear: External ear normal.  Left Ear: External ear normal.  Mouth/Throat: Oropharynx is clear and moist. No oropharyngeal exudate.  TMs mildly erythematous, no purulent material behind the TMs  Eyes: Conjunctivae are normal. Pupils are equal, round, and reactive to light.  Neck: Neck supple.  Cardiovascular: Normal rate, regular rhythm and normal heart sounds.   Pulmonary/Chest: Effort normal and breath sounds normal.  Lymphadenopathy:    He has no cervical adenopathy.  Neurological: He is alert.  CN 2-12 intact, 5/5 strength in bilateral biceps, triceps, grip, quads, hamstrings, plantar and dorsiflexion, sensation to light touch intact in bilateral UE and LE, normal gait, 2+ patellar reflexes  Skin: Skin is warm and dry. He is not diaphoretic.     Assessment/Plan:  Please see individual problem list.  Viral upper respiratory infection Symptoms most consistent with viral upper respiratory infection. No signs of focal bacterial illness at this time. Vital signs are stable. We will treat with a short prednisone taper. Tessalon for cough. Provided with a prescription for Augmentin to fill in 2-3 days if he has no improvement in his symptoms. He will seek medical attention if he has new or changing symptoms. He is given return precautions.    Orders Placed This Encounter  Procedures  . POCT Influenza A/B  Rapid flu negative.  Meds ordered this encounter  Medications  . predniSONE (DELTASONE) 10 MG tablet    Sig: Please take 50 mg (5 tablets) by mouth today, then decrease by one tablet daily until gone    Dispense:  15 tablet    Refill:  0  . amoxicillin-clavulanate (AUGMENTIN) 875-125 MG tablet    Sig: Take 1 tablet by mouth 2 (two) times daily. Do not fill until 06/23/15    Dispense:  14 tablet    Refill:  0  . benzonatate (TESSALON) 200 MG capsule    Sig: Take 1 capsule (200 mg total) by mouth 2 (two) times daily as needed for cough.    Dispense:  20 capsule    Refill:  0   [name], MD [place]

## 2015-06-21 NOTE — Assessment & Plan Note (Addendum)
Symptoms most consistent with viral upper respiratory infection. No signs of focal bacterial illness at this time. Vital signs are stable. We will treat with a short prednisone taper. Tessalon for cough. Provided with a prescription for Augmentin to fill in 2-3 days if he has no improvement in his symptoms. He will seek medical attention if he has new or changing symptoms. He is given return precautions.

## 2015-06-21 NOTE — Addendum Note (Signed)
Addended by: Durwin Glaze on: 06/21/2015 05:59 PM   Modules accepted: Orders

## 2015-06-22 ENCOUNTER — Telehealth: Payer: Self-pay | Admitting: *Deleted

## 2015-06-22 NOTE — Telephone Encounter (Signed)
Patient has requested to have a prescription for zolpidem Tartrate,He stated that [facility] carried this medication at a feasible price. He never took the prescription for rozerem, due to being familiar with the Ambien.  Pt. Contact [phone]

## 2015-06-22 NOTE — Telephone Encounter (Signed)
Please advise, if you recall we were unable to get this to be covered under his insurance, so I am guessing he is willing to pay out of pocket for it.  thanks

## 2015-06-23 MED ORDER — ZOLPIDEM TARTRATE 10 MG PO TABS: 10.0000 mg | ORAL_TABLET | Freq: Every evening | ORAL | Status: DC | PRN

## 2015-06-23 NOTE — Telephone Encounter (Signed)
Refill given

## 2015-06-23 NOTE — Telephone Encounter (Signed)
Aaron Quinn Please advise?

## 2015-06-23 NOTE — Telephone Encounter (Signed)
LM on patients voice mail that prescription is up front for patient to pick up .

## 2015-06-23 NOTE — Telephone Encounter (Signed)
Can we fill the zolpidem?

## 2015-06-23 NOTE — Telephone Encounter (Signed)
Pt called to follow up on the Rx. Pt states he will pick it up. Thank you!

## 2015-07-21 ENCOUNTER — Encounter: Payer: Self-pay | Admitting: Internal Medicine

## 2015-08-18 ENCOUNTER — Telehealth: Payer: Self-pay | Admitting: *Deleted

## 2015-08-18 MED ORDER — ZOLPIDEM TARTRATE 10 MG PO TABS: 10.0000 mg | ORAL_TABLET | Freq: Every evening | ORAL | Status: DC | PRN

## 2015-08-18 NOTE — Telephone Encounter (Signed)
Faxed

## 2015-08-18 NOTE — Telephone Encounter (Signed)
Refill printed. Please place at front for the patient.

## 2015-08-18 NOTE — Telephone Encounter (Signed)
Refill request:  [facility] [address] [place] [address] [phone] fax  zolpidem (AMBIEN) 10 MG tablet  "patient wants to fill for #90 because of nature of med. We did not want to do that unless ok 'd by you - thanks" - from pharmacist   Last filled: 06/24/15 Last office visit: 06/21/15 Next office visit: 10/07/15  Okay to fill?

## 2015-08-18 NOTE — Telephone Encounter (Signed)
Please advise on refill.

## 2015-09-02 ENCOUNTER — Encounter: Payer: Self-pay | Admitting: Family Medicine

## 2015-09-02 ENCOUNTER — Ambulatory Visit (INDEPENDENT_AMBULATORY_CARE_PROVIDER_SITE_OTHER): Payer: Medicare Other | Admitting: Family Medicine

## 2015-09-02 VITALS — BP 158/72 | HR 57 | Temp 98.2°F | Ht 71.0 in | Wt 174.4 lb

## 2015-09-02 DIAGNOSIS — I10 Essential (primary) hypertension: Secondary | ICD-10-CM | POA: Diagnosis not present

## 2015-09-02 MED ORDER — LISINOPRIL 10 MG PO TABS: 10.0000 mg | ORAL_TABLET | Freq: Every day | ORAL | Status: DC

## 2015-09-02 NOTE — Patient Instructions (Signed)
Nice to see you. We are going to check lab work today. We are going to increase your lisinopril to 10 mg daily. I will have you return in 1 week to recheck lab work and to bring in a blood pressure log. If you well chest pain, shortness of breath, swelling, or any new or changing symptoms please seek medical attention.

## 2015-09-02 NOTE — Assessment & Plan Note (Signed)
Not at goal. Recently started on lisinopril. We will increase this to 10 mg daily. We will check a BMP today to evaluate potassium and kidney function. He will return in 1 week for repeat BMP. He'll keep track of his blood pressures and if remaining elevated he will let us know. He'll bring a copy of his blood pressure log next week. He'll follow-up with me in a month. Continue his other blood pressure medications.

## 2015-09-02 NOTE — Progress Notes (Signed)
Pre visit review using our clinic review tool, if applicable. No additional management support is needed unless otherwise documented below in the visit note. 

## 2015-09-02 NOTE — Progress Notes (Signed)
Patient ID: Aaron Quinn, male   DOB: [birth year], 82 y.o.   MRN: [id number]  [name], MD Phone: [phone]  Aaron Quinn is a 82 y.o. male who presents today for f/u.  HYPERTENSION Disease Monitoring Home BP Monitoring [id number], systolics mostly in the [id number] Chest pain- no    Dyspnea- no Medications Compliance-  taking lisinopril 5 mg daily recently started by his neurologist, taking clonidine and HCTZ as well.  Edema- no  PMH: Former smoker   ROS see history of present illness  Objective  Physical Exam Filed Vitals:   09/02/15 1342  BP: 158/72  Pulse: 57  Temp: 98.2 F (36.8 C)    BP Readings from Last 3 Encounters:  09/02/15 158/72  06/21/15 130/64  04/09/15 128/84   Wt Readings from Last 3 Encounters:  09/02/15 174 lb 6.4 oz (79.107 kg)  06/21/15 172 lb 12.8 oz (78.382 kg)  04/09/15 169 lb 9.6 oz (76.93 kg)    Physical Exam  Constitutional: He is well-developed, well-nourished, and in no distress.  HENT:  Head: Normocephalic and atraumatic.  Right Ear: External ear normal.  Left Ear: External ear normal.  Cardiovascular: Normal rate, regular rhythm and normal heart sounds.   Pulmonary/Chest: Effort normal and breath sounds normal.  Musculoskeletal: He exhibits no edema.  Neurological: He is alert. Gait normal.  Skin: Skin is dry. He is not diaphoretic.     Assessment/Plan: Please see individual problem list.  Essential hypertension Not at goal. Recently started on lisinopril. We will increase this to 10 mg daily. We will check a BMP today to evaluate potassium and kidney function. He will return in 1 week for repeat BMP. He'll keep track of his blood pressures and if remaining elevated he will let us know. He'll bring a copy of his blood pressure log next week. He'll follow-up with me in a month. Continue his other blood pressure medications.    Orders Placed This Encounter  Procedures  . Basic Metabolic Panel (BMET)  .  Basic Metabolic Panel (BMET)    Standing Status: Future     Number of Occurrences:      Standing Expiration Date: 09/01/2016   [name], MD [place]

## 2015-09-03 LAB — BASIC METABOLIC PANEL
BUN: 22 mg/dL (ref 6–23)
CHLORIDE: 102 meq/L (ref 96–112)
CO2: 31 meq/L (ref 19–32)
CREATININE: 1.05 mg/dL (ref 0.40–1.50)
Calcium: 10.3 mg/dL (ref 8.4–10.5)
GFR: 71.9 mL/min (ref >60.00)
Glucose, Bld: 99 mg/dL (ref 70–99)
POTASSIUM: 4.4 meq/L (ref 3.5–5.1)
Sodium: 139 mEq/L (ref 135–145)

## 2015-09-06 ENCOUNTER — Encounter: Payer: Self-pay | Admitting: Family Medicine

## 2015-09-09 ENCOUNTER — Telehealth: Payer: Self-pay | Admitting: Family Medicine

## 2015-09-09 NOTE — Telephone Encounter (Signed)
Pt said that Dr. Caryl Bis wanted him to call in his BP results before labs tomorrow.   June 15- 140/69 June 16- 153/75 June 17- 150/76 June 18- 164/83 June 19- 119/61 June 20- 144/74 June 21- 128/65 June 22- 152/83, 2 hours later it was 129/78, 1 hour later it was 141/68

## 2015-09-09 NOTE — Telephone Encounter (Signed)
FYI

## 2015-09-10 ENCOUNTER — Other Ambulatory Visit (INDEPENDENT_AMBULATORY_CARE_PROVIDER_SITE_OTHER): Payer: Medicare Other

## 2015-09-10 DIAGNOSIS — I10 Essential (primary) hypertension: Secondary | ICD-10-CM

## 2015-09-10 DIAGNOSIS — Z13220 Encounter for screening for lipoid disorders: Secondary | ICD-10-CM

## 2015-09-10 LAB — LIPID PANEL
CHOLESTEROL: 202 mg/dL — AB (ref 0–200)
HDL: 48.1 mg/dL (ref >39.00)
LDL CALC: 116 mg/dL — AB (ref 0–99)
NONHDL: 153.72
Total CHOL/HDL Ratio: 4
Triglycerides: 188 mg/dL — ABNORMAL HIGH (ref 0.0–149.0)
VLDL: 37.6 mg/dL (ref 0.0–40.0)

## 2015-09-10 LAB — COMPREHENSIVE METABOLIC PANEL
ALBUMIN: 4.6 g/dL (ref 3.5–5.2)
ALT: 12 U/L (ref 0–53)
AST: 17 U/L (ref 0–37)
Alkaline Phosphatase: 86 U/L (ref 39–117)
BUN: 20 mg/dL (ref 6–23)
CHLORIDE: 102 meq/L (ref 96–112)
CO2: 31 meq/L (ref 19–32)
CREATININE: 1.08 mg/dL (ref 0.40–1.50)
Calcium: 10.5 mg/dL (ref 8.4–10.5)
GFR: 69.59 mL/min (ref >60.00)
GLUCOSE: 175 mg/dL — AB (ref 70–99)
POTASSIUM: 4.6 meq/L (ref 3.5–5.1)
SODIUM: 139 meq/L (ref 135–145)
Total Bilirubin: 0.7 mg/dL (ref 0.2–1.2)
Total Protein: 7.2 g/dL (ref 6.0–8.3)

## 2015-09-16 NOTE — Telephone Encounter (Signed)
Patient's blood pressures remained about 50-50 around goal or less. He should continue to monitor and inform us of further blood pressures and if remaining greater than 150/90 consistently we can increase his lisinopril at that time. Thanks.

## 2015-09-20 NOTE — Telephone Encounter (Signed)
Spoke with patient about message. He stated that BP has went down since he called last. I told him to continue to monitor for another week and call us back with BP readings. He will do this

## 2015-09-21 NOTE — Telephone Encounter (Signed)
Agree with this. We'll see what his blood pressure is running when he calls back.

## 2015-09-28 ENCOUNTER — Telehealth: Payer: Self-pay | Admitting: *Deleted

## 2015-09-28 NOTE — Telephone Encounter (Signed)
Read pt Dr. Caryl Bis statement.

## 2015-09-28 NOTE — Telephone Encounter (Signed)
Please advise, thanks.

## 2015-09-28 NOTE — Telephone Encounter (Signed)
Reported blood pressures are acceptable. He should continue to monitor at home and continue his current blood pressure regimen.

## 2015-09-28 NOTE — Telephone Encounter (Signed)
Noted, thanks!

## 2015-09-28 NOTE — Telephone Encounter (Signed)
Attempted to reach patient, left a VM to return my call. thanks 

## 2015-09-28 NOTE — Telephone Encounter (Signed)
Patient was advised by physician to report blood pressure readings. 09/15/15: 120/58 09/16/15 :130/65 09/17/15 :121/59 6/31/ 17 :131/57 09/18/15: 146/70 09/20/15 :116/68 09/21/15: 122/58 09/22/15: 115/58 09/23/15 :138/67 09/24/15 :136/80

## 2015-10-07 ENCOUNTER — Encounter: Payer: Self-pay | Admitting: Family Medicine

## 2015-10-07 ENCOUNTER — Ambulatory Visit (INDEPENDENT_AMBULATORY_CARE_PROVIDER_SITE_OTHER): Payer: Medicare Other | Admitting: Family Medicine

## 2015-10-07 ENCOUNTER — Telehealth: Payer: Self-pay | Admitting: *Deleted

## 2015-10-07 VITALS — BP 130/76 | HR 62 | Temp 98.4°F | Ht 71.0 in | Wt 173.2 lb

## 2015-10-07 DIAGNOSIS — I10 Essential (primary) hypertension: Secondary | ICD-10-CM | POA: Diagnosis not present

## 2015-10-07 DIAGNOSIS — J309 Allergic rhinitis, unspecified: Secondary | ICD-10-CM | POA: Insufficient documentation

## 2015-10-07 DIAGNOSIS — G4700 Insomnia, unspecified: Secondary | ICD-10-CM | POA: Insufficient documentation

## 2015-10-07 DIAGNOSIS — R7309 Other abnormal glucose: Secondary | ICD-10-CM | POA: Diagnosis not present

## 2015-10-07 DIAGNOSIS — E785 Hyperlipidemia, unspecified: Secondary | ICD-10-CM | POA: Diagnosis not present

## 2015-10-07 LAB — HEMOGLOBIN A1C: Hgb A1c MFr Bld: 5.9 % (ref 4.6–6.5)

## 2015-10-07 NOTE — Telephone Encounter (Signed)
Patient wanted to [facility] Dr. Caryl Bis that he takes fluticasone nasal spray

## 2015-10-07 NOTE — Progress Notes (Signed)
  [name], MD Phone: [phone]  Aaron Quinn is a 82 y.o. male who presents today for follow-up.  HYPERTENSION Disease Monitoring Home BP Monitoring 115-149/58-83, mostly [id number] systolics Chest pain- no    Dyspnea- no Medications Compliance-  Taking clonidine, HCTZ, and lisinopril.   Edema- no  Elevated LDL: Mildly elevated. Somewhat watches what he eats. Vegetables with most meals. Does eat out some. Has had lots of stress in his life recently with his son and wife. Stays physically active throughout the day.  Elevated glucose: Never been diagnosed with diabetes. No polyuria or polydipsia. Drinks plenty of fluid. No family history diabetes.  Patient has a lingering tickle in his throat and some postnasal drip after being treated for a cold 4 months ago. He uses a nose spray and thinks that it is Flonase. Has a history of allergies. Minimal cough with this. No other upper respiratory symptoms.  Insomnia: Taking Ambien 10 mg nightly. Typically takes two thirds of the tablet initially and then one third in the middle the night when he wakes up. Was previously on 15 mg. Can't sleep without the Ambien. Feels as though he needs to go back on 15 mg.  PMH: Former smoker   ROS see history of present illness  Objective  Physical Exam Filed Vitals:   10/07/15 0850  BP: 130/76  Pulse: 62  Temp: 98.4 F (36.9 C)    BP Readings from Last 3 Encounters:  10/07/15 130/76  09/02/15 158/72  06/21/15 130/64   Wt Readings from Last 3 Encounters:  10/07/15 173 lb 3.2 oz (78.563 kg)  09/02/15 174 lb 6.4 oz (79.107 kg)  06/21/15 172 lb 12.8 oz (78.382 kg)    Physical Exam  Constitutional: He is well-developed, well-nourished, and in no distress.  HENT:  Head: Normocephalic and atraumatic.  Mouth/Throat: Oropharynx is clear and moist. No oropharyngeal exudate.  Normal TMs bilaterally  Eyes: Conjunctivae are normal. Pupils are equal, round, and reactive to light.  Neck: Neck  supple.  Cardiovascular: Normal rate, regular rhythm and normal heart sounds.   Pulmonary/Chest: Effort normal and breath sounds normal.  Musculoskeletal: He exhibits no edema.  Lymphadenopathy:    He has no cervical adenopathy.  Neurological: He is alert. Gait normal.  Skin: Skin is warm and dry. He is not diaphoretic.     Assessment/Plan: Please see individual problem list.  Essential hypertension At goal. Continue current medications.  HLD (hyperlipidemia) Advised on diet and exercise.  Elevated glucose Check A1c.  Allergic rhinitis Patient's upper respiratory symptoms now likely related to allergic rhinitis. He will call us and let us know whatusing. If it is not Flonase we will start him on this. If it is Flonase we'll start him on Claritin.  Insomnia Currently on Ambien. Discussed that 15 mg is above the recommended dose of 10 mg. He will continue his current regimen.    Orders Placed This Encounter  Procedures  . HgB A1c    No orders of the defined types were placed in this encounter.     [name], MD [place]

## 2015-10-07 NOTE — Assessment & Plan Note (Signed)
Advised on diet and exercise. 

## 2015-10-07 NOTE — Assessment & Plan Note (Signed)
At goal. Continue current medications. 

## 2015-10-07 NOTE — Assessment & Plan Note (Signed)
Currently on Ambien. Discussed that 15 mg is above the recommended dose of 10 mg. He will continue his current regimen.

## 2015-10-07 NOTE — Telephone Encounter (Signed)
Added to chart

## 2015-10-07 NOTE — Assessment & Plan Note (Signed)
Patient's upper respiratory symptoms now likely related to allergic rhinitis. He will call us and let us know whatusing. If it is not Flonase we will start him on this. If it is Flonase we'll start him on Claritin.

## 2015-10-07 NOTE — Assessment & Plan Note (Signed)
Check A1c. 

## 2015-10-07 NOTE — Patient Instructions (Signed)
Nice to see you. Please continue your current blood pressure medication regimen and monitor your blood pressure. We will obtain an A1c to evaluate for diabetes today. Please continue to stay active and monitor what you eat. Please call us regarding thethat you use that we can appropriately prescribed additional treatment.

## 2015-10-07 NOTE — Progress Notes (Signed)
Pre visit review using our clinic review tool, if applicable. No additional management support is needed unless otherwise documented below in the visit note. 

## 2015-10-22 ENCOUNTER — Ambulatory Visit: Payer: Medicare Other | Admitting: Family Medicine

## 2015-10-22 ENCOUNTER — Ambulatory Visit (INDEPENDENT_AMBULATORY_CARE_PROVIDER_SITE_OTHER): Payer: Medicare Other | Admitting: Family Medicine

## 2015-10-22 ENCOUNTER — Encounter: Payer: Self-pay | Admitting: Family Medicine

## 2015-10-22 DIAGNOSIS — J309 Allergic rhinitis, unspecified: Secondary | ICD-10-CM

## 2015-10-22 MED ORDER — MONTELUKAST SODIUM 10 MG PO TABS: 10.0000 mg | ORAL_TABLET | Freq: Every day | ORAL | 0 refills | Status: DC

## 2015-10-22 MED ORDER — MONTELUKAST SODIUM 10 MG PO TABS: 10.0000 mg | ORAL_TABLET | Freq: Every day | ORAL | 3 refills | Status: DC

## 2015-10-22 NOTE — Patient Instructions (Signed)
Nice to see you. Your symptoms are likely related to allergies. We'll start you on Singulair to treat this. If you lightheadedness gets worse please let us know. If you develop fevers or cough productive of blood please seek medical attention.

## 2015-10-22 NOTE — Assessment & Plan Note (Signed)
Upper respiratory symptoms most consistent with allergic rhinitis. Currently on Flonase and Claritin. Suspect lightheadedness is related to some congestion. Orthostatics were negative. No symptoms otherwise with lightheadedness. Discussed staying well hydrated and monitoring his blood pressure. Consider backing off on his blood pressure medications if not improving with treatment of allergies. We will treat his allergies and see if his lightheadedness improves. Start on Singulair in addition to Claritin and Flonase. Given return precautions.

## 2015-10-22 NOTE — Progress Notes (Signed)
  [name], MD Phone: [phone]  Aaron Quinn is a 82 y.o. male who presents today for same-day visit.  Patient notes for the last 3 months he's had an intermittent tickle in his throat was sore throat and postnasal drainage. Some cough. About 3 weeks ago he had some lightheadedness with it. Occasionally gets lightheaded now. Mostly when he stands up though can be lightheaded with sitting her. No ear discomfort or congestion. Lots of drainage. Has history of allergies and uses Flonase and Claritin. Blood pressure has been well controlled. Denies chest pain, shortness breath, and palpitations.  ROS see history of present illness  Objective  Physical Exam Vitals:   10/22/15 0938  BP: 124/68  Pulse: 72  Temp: 98.8 F (37.1 C)   Laying blood pressure 116/72 pulse 67 Sitting blood pressure 120/64 pulse 73 Standing blood pressure 118/76 pulse 78  BP Readings from Last 3 Encounters:  10/22/15 124/68  10/07/15 130/76  09/02/15 (!) 158/72   Wt Readings from Last 3 Encounters:  10/22/15 172 lb 6.4 oz (78.2 kg)  10/07/15 173 lb 3.2 oz (78.6 kg)  09/02/15 174 lb 6.4 oz (79.1 kg)    Physical Exam  Constitutional: No distress.  HENT:  Head: Normocephalic and atraumatic.  Mouth/Throat: Oropharynx is clear and moist. No oropharyngeal exudate.  Normal TMs bilaterally  Eyes: Conjunctivae are normal. Pupils are equal, round, and reactive to light.  Neck: Neck supple.  Cardiovascular: Normal rate, regular rhythm and normal heart sounds.   Pulmonary/Chest: Effort normal and breath sounds normal.  Lymphadenopathy:    He has no cervical adenopathy.  Neurological: He is alert. Gait normal.  Skin: Skin is warm and dry. He is not diaphoretic.     Assessment/Plan: Please see individual problem list.  Allergic rhinitis Upper respiratory symptoms most consistent with allergic rhinitis. Currently on Flonase and Claritin. Suspect lightheadedness is related to some congestion.  Orthostatics were negative. No symptoms otherwise with lightheadedness. Discussed staying well hydrated and monitoring his blood pressure. Consider backing off on his blood pressure medications if not improving with treatment of allergies. We will treat his allergies and see if his lightheadedness improves. Start on Singulair in addition to Claritin and Flonase. Given return precautions.   No orders of the defined types were placed in this encounter.   Meds ordered this encounter  Medications  . DISCONTD: montelukast (SINGULAIR) 10 MG tablet    Sig: Take 1 tablet (10 mg total) by mouth at bedtime.    Dispense:  30 tablet    Refill:  3  . montelukast (SINGULAIR) 10 MG tablet    Sig: Take 1 tablet (10 mg total) by mouth at bedtime.    Dispense:  30 tablet    Refill:  0    [name], MD [place]

## 2015-11-15 ENCOUNTER — Other Ambulatory Visit: Payer: Self-pay | Admitting: Surgical

## 2015-11-15 MED ORDER — ZOLPIDEM TARTRATE 10 MG PO TABS: 10.0000 mg | ORAL_TABLET | Freq: Every evening | ORAL | 0 refills | Status: DC | PRN

## 2015-11-18 ENCOUNTER — Other Ambulatory Visit: Payer: Self-pay | Admitting: Family Medicine

## 2015-12-15 ENCOUNTER — Telehealth: Payer: Self-pay | Admitting: Family Medicine

## 2015-12-15 ENCOUNTER — Other Ambulatory Visit: Payer: Self-pay

## 2015-12-15 NOTE — Telephone Encounter (Signed)
Has been discontinued

## 2015-12-15 NOTE — Telephone Encounter (Signed)
Per review of my most recent note it appears that we started him on this in early August. If he tried it and it was not beneficial he does not need to take this. If he is having allergy symptoms and it was beneficial he can start taking it again.

## 2015-12-15 NOTE — Telephone Encounter (Signed)
Okay by me.

## 2015-12-15 NOTE — Telephone Encounter (Signed)
Pt stated when he was on this medication it was not beneficial and would like to discontinue the medication of his list.

## 2015-12-15 NOTE — Telephone Encounter (Signed)
Pt called stating that he got a call from him pharmacy stating that his montelukast (SINGULAIR) 10 MG tablet. He thought that Dr. Caryl Bis had told him not to take it anymore if he didn't need it. Pt wants to know if Dr. Caryl Bis changed his mind? Please advise. Thank you!  Call pt @ [phone] - may leave message.

## 2015-12-15 NOTE — Telephone Encounter (Signed)
Via chart you put him on this medication for allergies.

## 2016-01-07 ENCOUNTER — Encounter: Payer: Self-pay | Admitting: Family Medicine

## 2016-01-07 ENCOUNTER — Ambulatory Visit (INDEPENDENT_AMBULATORY_CARE_PROVIDER_SITE_OTHER): Payer: Medicare Other | Admitting: Family Medicine

## 2016-01-07 DIAGNOSIS — Z23 Encounter for immunization: Secondary | ICD-10-CM | POA: Diagnosis not present

## 2016-01-07 DIAGNOSIS — G629 Polyneuropathy, unspecified: Secondary | ICD-10-CM | POA: Insufficient documentation

## 2016-01-07 MED ORDER — ZOLPIDEM TARTRATE 10 MG PO TABS: 10.0000 mg | ORAL_TABLET | Freq: Every evening | ORAL | 0 refills | Status: DC | PRN

## 2016-01-07 NOTE — Patient Instructions (Signed)
Nice to see you. You have neuropathy. You should continue the gabapentin. Continue to monitor blood pressure. Your goal is less than 150/90. I have refilled your Ambien.

## 2016-01-07 NOTE — Progress Notes (Signed)
  [name], MD Phone: [phone]  Aaron Quinn is a 82 y.o. male who presents today for f/u.  HYPERTENSION  Disease Monitoring  Home BP Monitoring 118-149/59-81 Chest pain- no    Dyspnea- no Medications  Compliance-  taking HCTZ, clonidine, amlodipine.  Edema- no  Neuropathy: Patient notes burning and tingling in his feet bilaterally for 15-20 years. Also notes his feet get sore with this. The neuropathy has gotten worse over the last several years. Notes he is currently on gabapentin.  Insomnia: Doing well on Ambien 10 mg nightly. Not too drowsy with this. Does not have any issues with this. Gets at least 8 hours of sleep with this.  PMH: Former smoker   ROS see history of present illness  Objective  Physical Exam Vitals:   01/07/16 0926  BP: 138/70  Pulse: (!) 56  Temp: 98.2 F (36.8 C)    BP Readings from Last 3 Encounters:  01/07/16 138/70  10/22/15 124/68  10/07/15 130/76   Wt Readings from Last 3 Encounters:  01/07/16 177 lb 2 oz (80.3 kg)  10/22/15 172 lb 6.4 oz (78.2 kg)  10/07/15 173 lb 3.2 oz (78.6 kg)    Physical Exam  Constitutional: He is well-developed, well-nourished, and in no distress.  HENT:  Head: Normocephalic and atraumatic.  Cardiovascular: Normal rate, regular rhythm and normal heart sounds.   Pulmonary/Chest: Effort normal and breath sounds normal.  Musculoskeletal: He exhibits no edema.  Bilateral feet with decreased monofilament testing, intact light touch sensation, 2+ PT and DP pulses, no skin breakdown  Neurological: He is alert. Gait normal.   Assessment/Plan: Please see individual problem list.  Essential hypertension At goal. Continue current medications.  Insomnia Stable. Tolerating Ambien. Refill given.  Neuropathy ([place]) History and exam most consistent with neuropathy. He is already on gabapentin. I discussed increasing this to see if this would be of benefit though he deferred at this time. We'll continue to  monitor.   Orders Placed This Encounter  Procedures  . Flu vaccine HIGH DOSE PF    [name], MD [place]

## 2016-01-07 NOTE — Assessment & Plan Note (Signed)
Stable. Tolerating Ambien. Refill given.

## 2016-01-07 NOTE — Assessment & Plan Note (Signed)
History and exam most consistent with neuropathy. He is already on gabapentin. I discussed increasing this to see if this would be of benefit though he deferred at this time. We'll continue to monitor.

## 2016-01-07 NOTE — Assessment & Plan Note (Signed)
At goal. Continue current medications. 

## 2016-03-17 ENCOUNTER — Telehealth: Payer: Self-pay | Admitting: Family Medicine

## 2016-03-17 NOTE — Telephone Encounter (Signed)
Patient advised of below and verbalized an understanding . Advised if symptoms if gets worse to seek treatment at urgent care.

## 2016-03-17 NOTE — Telephone Encounter (Addendum)
Reason for call: Symptoms: sinus drainage , light headed, sore throat, cough with yellow phlegm this am, denies SOB, no chest congestion, headache  DurationX2 day  Medications: Alka  Seltzer Plus Cold  Chronic Medical Problems HTN Last seen for this problem: Seen by:N/a  Advised patient he would need to go to urgent care wants to know want can he take OTC  Please advise

## 2016-03-17 NOTE — Telephone Encounter (Signed)
OTC Flonase, Antihistamine, Nettipot. Avoid decongestants as they may increase BP.

## 2016-03-17 NOTE — Telephone Encounter (Signed)
Pt called c/o sinus congestion, sore throat, cough. Coughing up yellow phlegm. Please advise, thank you!  Call pt @ [phone]

## 2016-03-20 DIAGNOSIS — Z8551 Personal history of malignant neoplasm of bladder: Secondary | ICD-10-CM

## 2016-03-20 DIAGNOSIS — C679 Malignant neoplasm of bladder, unspecified: Secondary | ICD-10-CM

## 2016-03-20 HISTORY — DX: Malignant neoplasm of bladder, unspecified: C67.9

## 2016-03-20 HISTORY — DX: Personal history of malignant neoplasm of bladder: Z85.51

## 2016-03-21 ENCOUNTER — Other Ambulatory Visit: Payer: Self-pay | Admitting: Podiatry

## 2016-04-11 ENCOUNTER — Encounter: Payer: Self-pay | Admitting: Family Medicine

## 2016-04-11 ENCOUNTER — Ambulatory Visit (INDEPENDENT_AMBULATORY_CARE_PROVIDER_SITE_OTHER): Payer: Medicare Other | Admitting: Family Medicine

## 2016-04-11 ENCOUNTER — Ambulatory Visit (INDEPENDENT_AMBULATORY_CARE_PROVIDER_SITE_OTHER): Payer: Medicare Other

## 2016-04-11 VITALS — BP 122/60 | HR 70 | Temp 98.0°F | Resp 14 | Ht 70.0 in | Wt 173.0 lb

## 2016-04-11 DIAGNOSIS — J309 Allergic rhinitis, unspecified: Secondary | ICD-10-CM

## 2016-04-11 DIAGNOSIS — Z0000 Encounter for general adult medical examination without abnormal findings: Secondary | ICD-10-CM | POA: Diagnosis not present

## 2016-04-11 DIAGNOSIS — I10 Essential (primary) hypertension: Secondary | ICD-10-CM | POA: Diagnosis not present

## 2016-04-11 DIAGNOSIS — F5101 Primary insomnia: Secondary | ICD-10-CM

## 2016-04-11 MED ORDER — MONTELUKAST SODIUM 10 MG PO TABS: 10.0000 mg | ORAL_TABLET | Freq: Every day | ORAL | 3 refills | Status: DC

## 2016-04-11 NOTE — Progress Notes (Signed)
Pre visit review using our clinic review tool, if applicable. No additional management support is needed unless otherwise documented below in the visit note. [facility]

## 2016-04-11 NOTE — Patient Instructions (Addendum)
Aaron Quinn , Thank you for taking time to come for your Medicare Wellness Visit. I appreciate your ongoing commitment to your health goals. Please review the following plan we discussed and let me know if I can assist you in the future.   Follow up with Dr. Caryl Bis as needed.  These are the goals we discussed: Goals    . Increase physical activity          Stay hydrated and drink plenty of fluids Stay active and walk for exercise/do chair exercises as tolerated Low carb foods. Vegetables, lean meats (chicken, [place], fish)       This is a list of the screening recommended for you and due dates:  Health Maintenance  Topic Date Due  . Shingles Vaccine  10/26/1993  . Pneumonia vaccines (2 of 2 - PPSV23) 02/17/2014  . Tetanus Vaccine  04/08/2025  . Flu Shot  Completed   Health Maintenance, Male A healthy lifestyle and preventative care can promote health and wellness.  Maintain regular health, dental, and eye exams.  Eat a healthy diet. Foods like vegetables, fruits, whole grains, low-fat dairy products, and lean protein foods contain the nutrients you need and are low in calories. Decrease your intake of foods high in solid fats, added sugars, and salt. Get information about a proper diet from your health care provider, if necessary.  Regular physical exercise is one of the most important things you can do for your health. Most adults should get at least 150 minutes of moderate-intensity exercise (any activity that increases your heart rate and causes you to sweat) each week. In addition, most adults need muscle-strengthening exercises on 2 or more days a week.   Maintain a healthy weight. The body mass index (BMI) is a screening tool to identify possible weight problems. It provides an estimate of body fat based on height and weight. Your health care provider can find your BMI and can help you achieve or maintain a healthy weight. For males 20 years and older:  A BMI below  18.5 is considered underweight.  A BMI of 18.5 to 24.9 is normal.  A BMI of 25 to 29.9 is considered overweight.  A BMI of 30 and above is considered obese.  Maintain normal blood lipids and cholesterol by exercising and minimizing your intake of saturated fat. Eat a balanced diet with plenty of fruits and vegetables. Blood tests for lipids and cholesterol should begin at age 35 and be repeated every 5 years. If your lipid or cholesterol levels are high, you are over age 25, or you are at high risk for heart disease, you may need your cholesterol levels checked more frequently.Ongoing high lipid and cholesterol levels should be treated with medicines if diet and exercise are not working.  If you smoke, find out from your health care provider how to quit. If you do not use tobacco, do not start.  Lung cancer screening is recommended for adults aged 31-80 years who are at high risk for developing lung cancer because of a history of smoking. A yearly low-dose CT scan of the lungs is recommended for people who have at least a 30-pack-year history of smoking and are current smokers or have quit within the past 15 years. A pack year of smoking is smoking an average of 1 pack of cigarettes a day for 1 year (for example, a 30-pack-year history of smoking could mean smoking 1 pack a day for 30 years or 2 packs a day for 15  years). Yearly screening should continue until the smoker has stopped smoking for at least 15 years. Yearly screening should be stopped for people who develop a health problem that would prevent them from having lung cancer treatment.  If you choose to drink alcohol, do not have more than 2 drinks per day. One drink is considered to be 12 oz (360 mL) of beer, 5 oz (150 mL) of wine, or 1.5 oz (45 mL) of liquor.  Avoid the use of street drugs. Do not share needles with anyone. Ask for help if you need support or instructions about stopping the use of drugs.  High blood pressure causes  heart disease and increases the risk of stroke. High blood pressure is more likely to develop in:  People who have blood pressure in the end of the normal range (100-139/85-89 mm Hg).  People who are overweight or obese.  People who are African American.  If you are 76-61 years of age, have your blood pressure checked every 3-5 years. If you are 77 years of age or older, have your blood pressure checked every year. You should have your blood pressure measured twice-once when you are at a hospital or clinic, and once when you are not at a hospital or clinic. Record the average of the two measurements. To check your blood pressure when you are not at a hospital or clinic, you can use:  An automated blood pressure machine at a pharmacy.  A home blood pressure monitor.  If you are 31-35 years old, ask your health care provider if you should take aspirin to prevent heart disease.  Diabetes screening involves taking a blood sample to check your fasting blood sugar level. This should be done once every 3 years after age 60 if you are at a normal weight and without risk factors for diabetes. Testing should be considered at a younger age or be carried out more frequently if you are overweight and have at least 1 risk factor for diabetes.  Colorectal cancer can be detected and often prevented. Most routine colorectal cancer screening begins at the age of 56 and continues through age 30. However, your health care provider may recommend screening at an earlier age if you have risk factors for colon cancer. On a yearly basis, your health care provider may provide home test kits to check for hidden blood in the stool. A small camera at the end of a tube may be used to directly examine the colon (sigmoidoscopy or colonoscopy) to detect the earliest forms of colorectal cancer. Talk to your health care provider about this at age 71 when routine screening begins. A direct exam of the colon should be repeated every  5-10 years through age 65, unless early forms of precancerous polyps or small growths are found.  People who are at an increased risk for hepatitis B should be screened for this virus. You are considered at high risk for hepatitis B if:  You were born in a country where hepatitis B occurs often. Talk with your health care provider about which countries are considered high risk.  Your parents were born in a high-risk country and you have not received a shot to protect against hepatitis B (hepatitis B vaccine).  You have HIV or AIDS.  You use needles to inject street drugs.  You live with, or have sex with, someone who has hepatitis B.  You are a man who has sex with other men (MSM).  You get hemodialysis treatment.  You take certain medicines for conditions like cancer, organ transplantation, and autoimmune conditions.  Hepatitis C blood testing is recommended for all people born from [birth year] through 1963 and any individual with known risk factors for hepatitis C.  Healthy men should no longer receive prostate-specific antigen (PSA) blood tests as part of routine cancer screening. Talk to your health care provider about prostate cancer screening.  Testicular cancer screening is not recommended for adolescents or adult males who have no symptoms. Screening includes self-exam, a health care provider exam, and other screening tests. Consult with your health care provider about any symptoms you have or any concerns you have about testicular cancer.  Practice safe sex. Use condoms and avoid high-risk sexual practices to reduce the spread of sexually transmitted infections (STIs).  You should be screened for STIs, including gonorrhea and chlamydia if:  You are sexually active and are younger than 24 years.  You are older than 24 years, and your health care provider tells you that you are at risk for this type of infection.  Your sexual activity has changed since you were last screened, and  you are at an increased risk for chlamydia or gonorrhea. Ask your health care provider if you are at risk.  If you are at risk of being infected with HIV, it is recommended that you take a prescription medicine daily to prevent HIV infection. This is called pre-exposure prophylaxis (PrEP). You are considered at risk if:  You are a man who has sex with other men (MSM).  You are a heterosexual man who is sexually active with multiple partners.  You take drugs by injection.  You are sexually active with a partner who has HIV.  Talk with your health care provider about whether you are at high risk of being infected with HIV. If you choose to begin PrEP, you should first be tested for HIV. You should then be tested every 3 months for as long as you are taking PrEP.  Use sunscreen. Apply sunscreen liberally and repeatedly throughout the day. You should seek shade when your shadow is shorter than you. Protect yourself by wearing long sleeves, pants, a wide-brimmed hat, and sunglasses year round whenever you are outdoors.  Tell your health care provider of new moles or changes in moles, especially if there is a change in shape or color. Also, tell your health care provider if a mole is larger than the size of a pencil eraser.  A one-time screening for abdominal aortic aneurysm (AAA) and surgical repair of large AAAs by ultrasound is recommended for men aged 85-75 years who are current or former smokers.  Stay current with your vaccines (immunizations). This information is not intended to replace advice given to you by your health care provider. Make sure you discuss any questions you have with your health care provider. Document Released: 09/02/2007 Document Revised: 03/27/2014 Document Reviewed: 12/08/2014 Elsevier Interactive Patient Education  2017 [employer].

## 2016-04-11 NOTE — Progress Notes (Signed)
Subjective:   Aaron Quinn is a 83 y.o. male who presents for an Initial Medicare Annual Wellness Visit.  Review of Systems  No ROS.  Medicare Wellness Visit.  Cardiac Risk Factors include: advanced age (>52men, >53 women);male gender;hypertension    Objective:    Today's Vitals   04/11/16 0903  BP: 122/60  Pulse: 70  Resp: 14  Temp: 98 F (36.7 C)  TempSrc: Oral  SpO2: 95%  Weight: 173 lb (78.5 kg)  Height: 5\' 10"  (1.778 m)   Body mass index is 24.82 kg/m.  Current Medications (verified) Outpatient Encounter Prescriptions as of 04/11/2016  Medication Sig  . allopurinol (ZYLOPRIM) 300 MG tablet Take 1 tablet (300 mg total) by mouth daily.  Marland Kitchen amLODipine (NORVASC) 2.5 MG tablet Take by mouth.  Marland Kitchen aspirin 81 MG tablet Take 81 mg by mouth daily.  . citalopram (CELEXA) 10 MG tablet Take 20 mg by mouth daily.  . cloNIDine (CATAPRES) 0.1 MG tablet Take 1 tablet (0.1 mg total) by mouth 2 (two) times daily.  . fluticasone (FLONASE) 50 MCG/ACT nasal spray Place into both nostrils daily.  Marland Kitchen gabapentin (NEURONTIN) 400 MG capsule Take 400 mg by mouth daily.  . hydrochlorothiazide (HYDRODIURIL) 25 MG tablet Take 1 tablet (25 mg total) by mouth daily.  Marland Kitchen ketoconazole (NIZORAL) 2 % shampoo SHAMPOO INTO SCALP TWICE PER WEEK.  . meloxicam (MOBIC) 15 MG tablet Take 15 mg by mouth daily. with food  . rOPINIRole (REQUIP XL) 2 MG 24 hr tablet Take 1 tablet daily for 1 week, then increase to 1 tablet twice daily and continue this dose  . zolpidem (AMBIEN) 10 MG tablet Take 1 tablet (10 mg total) by mouth at bedtime as needed for sleep.  . [DISCONTINUED] lisinopril (PRINIVIL,ZESTRIL) 10 MG tablet Take 1 tablet (10 mg total) by mouth daily. (Patient not taking: Reported on 01/07/2016)  . [DISCONTINUED] UNABLE TO FIND Pt takes [facility]. Takes 1 tablet in the morning and 1 tablet in the evening   No facility-administered encounter medications on file as of 04/11/2016.      Allergies (verified) Patient has no known allergies.   History: Past Medical History:  Diagnosis Date  . Arthritis   . Diverticulosis   . Fibromyalgia 1995  . Glaucoma   . Hyperlipidemia   . Hypertension   . Kidney stones   . Positive TB test   . Tubulovillous adenoma of colon    Past Surgical History:  Procedure Laterality Date  . APPENDECTOMY  2005  . CATARACT EXTRACTION, BILATERAL  2012  . COLONOSCOPY W/ BIOPSIES    . HEMORRHOID BANDING    . TONSILLECTOMY  1940   Family History  Problem Relation Age of Onset  . Colon cancer Maternal Grandmother 26    Died at 6  . Heart disease Father   . Arthritis      Parent  . Hypertension      Parent, grandparent  . Diabetes Son   . Colon polyps Neg Hx   . Kidney disease Neg Hx   . Esophageal cancer Neg Hx   . Gallbladder disease Neg Hx    Social History   Occupational History  . Retired    Social History Main Topics  . Smoking status: Former Smoker    Packs/day: 0.50    Years: 20.00    Types: Cigarettes    Quit date: 03/20/1982  . Smokeless tobacco: Never Used  . Alcohol use No  . Drug  use: No  . Sexual activity: No   Tobacco Counseling Counseling given: Not Answered   Activities of Daily Living In your present state of health, do you have any difficulty performing the following activities: 04/11/2016  Hearing? N  Vision? N  Difficulty concentrating or making decisions? N  Walking or climbing stairs? Y  Dressing or bathing? N  Doing errands, shopping? N  Preparing Food and eating ? N  Using the Toilet? N  In the past six months, have you accidently leaked urine? N  Do you have problems with loss of bowel control? N  Managing your Medications? N  Managing your Finances? N  Housekeeping or managing your Housekeeping? N  Some recent data might be hidden    Immunizations and Health Maintenance Immunization History  Administered Date(s) Administered  . Influenza, High Dose Seasonal PF 01/07/2016   . Influenza-Unspecified 12/18/2014  . Tdap 04/09/2015   Health Maintenance Due  Topic Date Due  . ZOSTAVAX  10/26/1993  . PNA vac Low Risk Adult (2 of 2 - PPSV23) 02/17/2014    Patient Care Team: Leone Haven, MD as PCP - General (Family Medicine)  Indicate any recent Medical Services you may have received from other than [facility] providers in the past year (date may be approximate).    Assessment:   This is a routine wellness examination for Aaron Quinn. The goal of the wellness visit is to assist the patient how to close the gaps in care and create a preventative care plan for the patient.   Osteoporosis risk reviewed.  Medications reviewed; taking without issues or barriers.  Safety issues reviewed; lives with wife. Smoke detectors in the home. No firearms in the home. Wears seatbelts when driving or riding with others. No violence in the home.  No identified risk were noted; The patient was oriented x 3; appropriate in dress and manner and no objective failures at ADL's or IADL's.   BMI; discussed the importance of a healthy diet, water intake and exercise. Educational material provided.  HTN; followed by PCP.  Sleep; resting well without issues.  Patient Concerns: Sinus drainage with cough and sore throat. Deferred to PCP for follow up.  Hearing/Vision screen Hearing Screening Comments: Patient passes the whisper test Vision Screening Comments: Followed by [facility] (Dr. Joan Mayans) Last OV 2017 Wears glasses Cataracts extracted, bilateral Visual acuity not assessed per patient preference since he has regular follow up with his ophthalmologist  Dietary issues and exercise activities discussed: Current Exercise Habits: The patient does not participate in regular exercise at present  Goals    . Increase physical activity          Stay hydrated and drink plenty of fluids Stay active and walk for exercise/do chair exercises as tolerated Low carb foods.  Vegetables, lean meats (chicken, [place], fish)      Depression Screen PHQ 2/9 Scores 04/11/2016  PHQ - 2 Score 0    Fall Risk Fall Risk  04/11/2016  Falls in the past year? No    Cognitive Function:     6CIT Screen 04/11/2016  What Year? 0 points  What month? 0 points  What time? 0 points  Count back from 20 0 points  Months in reverse 0 points  Repeat phrase 0 points  Total Score 0    Screening Tests Health Maintenance  Topic Date Due  . ZOSTAVAX  10/26/1993  . PNA vac Low Risk Adult (2 of 2 - PPSV23) 02/17/2014  . TETANUS/TDAP  04/08/2025  . INFLUENZA VACCINE  Completed        Plan:    End of life planning; Advance aging; Advanced directives discussed. Copy of current HCPOA/Living Will requested.  Medicare Attestation I have personally reviewed: The patient's medical and social history Their use of alcohol, tobacco or illicit drugs Their current medications and supplements The patient's functional ability including ADLs,fall risks, home safety risks, cognitive, and hearing and visual impairment Diet and physical activities Evidence for depression   The patient's weight, height, BMI, and visual acuity have been recorded in the chart.  I have made referrals and provided education to the patient based on review of the above and I have provided the patient with a written personalized care plan for preventive services.    During the course of the visit Dorean was educated and counseled about the following appropriate screening and preventive services:   Vaccines to include Pneumoccal, Influenza, Hepatitis B, Td, Zostavax, HCV  Electrocardiogram  Colorectal cancer screening  Cardiovascular disease screening  Diabetes screening  Glaucoma screening  Nutrition counseling  Prostate cancer screening  Smoking cessation counseling  Patient Instructions (the written plan) were given to the patient.   Varney Biles, LPN   [phone]

## 2016-04-11 NOTE — Assessment & Plan Note (Signed)
At goal today. Not goal at home. We will have him return sometime in the next week or so with his blood pressure monitor to compare our readings to his. We'll determine next step in management based on that.

## 2016-04-11 NOTE — Patient Instructions (Signed)
Nice to see you. Your drainage is likely related to allergies. You should continue the Flonase and Claritin and we will add Singulair. If your symptoms do not improve with this we should consider referral to an allergist. We will have you return sometime in the next week with your blood pressure monitor to recheck your blood pressure and compare your monitor to our check. Depending on this we will consider adding a medication to your regimen.

## 2016-04-11 NOTE — Assessment & Plan Note (Signed)
Suspect upper respiratory symptoms are related to allergies. He has no sinus congestion or focal findings on exam to indicate bacterial illness. We will add Singulair. He'll continue Claritin and Flonase. If he continues to have issues with this could consider referral to ENT.

## 2016-04-11 NOTE — Assessment & Plan Note (Signed)
Doing well with Ambien. Tolerating Ambien. He'll let us know when he needs a refill.

## 2016-04-11 NOTE — Progress Notes (Signed)
  [name], MD Phone: [phone]  Aaron Quinn is a 83 y.o. male who presents today for follow-up.  Patient notes nasal congestion, postnasal drip, and mild cough for the last 3 weeks. Notes mild sore throat with this. Coughing up clear liquid from his throat. Was blowing yellow mucus out of his nose though is now clear. No fevers. Did have some sweats previously though not recently. No sick contacts. No sinus congestion or pressure. Has been using a sinus rinse, Claritin, and Flonase. Has a long history of allergic rhinitis.  Hypertension: Checking at home and it's ranged from 126-159/60-87. Taking HCTZ, clonidine, and amlodipine. No chest pain, shortness breath, or edema.  Insomnia: Taking Ambien nightly. Takes two thirds of the tablet before bed and then one third at night. He has no drowsiness or confusion with this. He gets about 8-9 hours of sleep  PMH: former smoker   ROS see history of present illness  Objective  Physical Exam Vitals:   04/11/16 0946  BP: 122/60  Pulse: 70  Temp: 98 F (36.7 C)    BP Readings from Last 3 Encounters:  04/11/16 122/60  04/11/16 122/60  01/07/16 138/70   Wt Readings from Last 3 Encounters:  04/11/16 173 lb (78.5 kg)  04/11/16 173 lb (78.5 kg)  01/07/16 177 lb 2 oz (80.3 kg)    Physical Exam  Constitutional: He is well-developed, well-nourished, and in no distress.  HENT:  Head: Normocephalic and atraumatic.  Mouth/Throat: Oropharynx is clear and moist. No oropharyngeal exudate.  Normal TMs bilaterally, no sinus tenderness to percussion  Eyes: Conjunctivae are normal. Pupils are equal, round, and reactive to light.  Neck: Neck supple.  Cardiovascular: Normal rate, regular rhythm and normal heart sounds.   Pulmonary/Chest: Effort normal and breath sounds normal.  Musculoskeletal: He exhibits no edema.  Lymphadenopathy:    He has no cervical adenopathy.  Neurological: He is alert. Gait normal.  Skin: Skin is warm and  dry.     Assessment/Plan: Please see individual problem list.  Essential hypertension At goal today. Not goal at home. We will have him return sometime in the next week or so with his blood pressure monitor to compare our readings to his. We'll determine next step in management based on that.  Allergic rhinitis Suspect upper respiratory symptoms are related to allergies. He has no sinus congestion or focal findings on exam to indicate bacterial illness. We will add Singulair. He'll continue Claritin and Flonase. If he continues to have issues with this could consider referral to ENT.  Insomnia Doing well with Ambien. Tolerating Ambien. He'll let us know when he needs a refill.   No orders of the defined types were placed in this encounter.   Meds ordered this encounter  Medications  . montelukast (SINGULAIR) 10 MG tablet    Sig: Take 1 tablet (10 mg total) by mouth at bedtime.    Dispense:  90 tablet    Refill:  [place], MD [place]

## 2016-04-14 NOTE — Progress Notes (Signed)
I have reviewed the above note and agree.  Eric Sonnenberg, M.D.  

## 2016-04-19 ENCOUNTER — Other Ambulatory Visit: Payer: Self-pay

## 2016-04-19 ENCOUNTER — Ambulatory Visit (INDEPENDENT_AMBULATORY_CARE_PROVIDER_SITE_OTHER): Payer: Medicare Other

## 2016-04-19 VITALS — BP 144/74 | HR 97 | Resp 18

## 2016-04-19 DIAGNOSIS — I10 Essential (primary) hypertension: Secondary | ICD-10-CM

## 2016-04-19 NOTE — Telephone Encounter (Signed)
Last office visit 04/11/16 Next office visit 04/25/16

## 2016-04-19 NOTE — Progress Notes (Signed)
Patient comes in for blood check.  He brings in home blood pressure machine.   Checked blood pressure  on left arm 155/87.   Patient states blood pressure readings have been running around 150/82 when checking at home.  Please advise.

## 2016-04-20 MED ORDER — ZOLPIDEM TARTRATE 10 MG PO TABS: 10.0000 mg | ORAL_TABLET | Freq: Every evening | ORAL | 0 refills | Status: DC | PRN

## 2016-04-20 NOTE — Telephone Encounter (Signed)
Pt called and stated that this was sent to the wrong pharmacy. It should have been sent to [facility], [place]

## 2016-04-20 NOTE — Progress Notes (Signed)
Patient states he is concerned that his machine wasn't giving him accurate readings.   He has purchased new Blood pressure monitor wants to monitor before increasing medication.  Please advise

## 2016-04-20 NOTE — Progress Notes (Signed)
Patient's blood pressure is not at goal. I would like to increase his amlodipine to 5 mg daily if he is willing. Please see if he is willing to do this. Thanks.

## 2016-04-21 NOTE — Progress Notes (Signed)
That is fine. He should contact us in 1-2 weeks and let us know what his blood pressure has been running. Thanks.

## 2016-04-24 NOTE — Progress Notes (Signed)
Left message to call.

## 2016-04-24 NOTE — Telephone Encounter (Signed)
[facility]

## 2016-04-28 ENCOUNTER — Telehealth: Payer: Self-pay | Admitting: Family Medicine

## 2016-04-28 NOTE — Telephone Encounter (Signed)
Please advise 

## 2016-04-28 NOTE — Telephone Encounter (Signed)
Blood pressures appear to be relatively well controlled. He should continue to monitor at home. Thanks.

## 2016-04-28 NOTE — Telephone Encounter (Signed)
Pt called in with some bp readings:  144/77 - 2/1 117/64 - 2/2 125/67 - 2/4 120/66 - 2/5 140/81 - 2/6 144/82 - 2/7 108/59 - 2/9

## 2016-05-01 ENCOUNTER — Telehealth: Payer: Self-pay | Admitting: Family Medicine

## 2016-05-01 NOTE — Telephone Encounter (Signed)
Patient notified

## 2016-05-01 NOTE — Telephone Encounter (Signed)
Becky from [employer] called and stated that they need a PA on pt's zolpidem (AMBIEN) 10 MG tablet. They are faxing over the paperwork to have this completed. Thank you!

## 2016-05-01 NOTE — Telephone Encounter (Signed)
noted 

## 2016-05-30 ENCOUNTER — Ambulatory Visit: Payer: Medicare Other | Admitting: Nurse Practitioner

## 2016-05-30 ENCOUNTER — Other Ambulatory Visit: Payer: Self-pay | Admitting: Family Medicine

## 2016-06-01 ENCOUNTER — Other Ambulatory Visit: Payer: Self-pay | Admitting: *Deleted

## 2016-06-01 NOTE — Telephone Encounter (Signed)
Pt requested a two week supply Rx for clonidine be called into [facility] . Please call pt [phone] Patient requested to have this filled today, he has to go out of town, due to his son passing.

## 2016-06-01 NOTE — Telephone Encounter (Signed)
Last OV 04/11/16 last filled 05/31/16 [facility], patient would like 2 week supply to local pharmacy

## 2016-06-02 MED ORDER — CLONIDINE HCL 0.1 MG PO TABS: 0.1000 mg | ORAL_TABLET | Freq: Two times a day (BID) | ORAL | 0 refills | Status: DC

## 2016-06-02 NOTE — Telephone Encounter (Signed)
Sent to local pharmacy.

## 2016-06-06 ENCOUNTER — Other Ambulatory Visit (INDEPENDENT_AMBULATORY_CARE_PROVIDER_SITE_OTHER): Payer: Medicare Other

## 2016-06-06 ENCOUNTER — Encounter: Payer: Self-pay | Admitting: Nurse Practitioner

## 2016-06-06 ENCOUNTER — Ambulatory Visit (INDEPENDENT_AMBULATORY_CARE_PROVIDER_SITE_OTHER): Payer: Medicare Other | Admitting: Nurse Practitioner

## 2016-06-06 VITALS — BP 130/76 | HR 86 | Ht 69.0 in | Wt 175.0 lb

## 2016-06-06 DIAGNOSIS — R194 Change in bowel habit: Secondary | ICD-10-CM | POA: Diagnosis not present

## 2016-06-06 LAB — CBC
HEMATOCRIT: 44.5 % (ref 39.0–52.0)
HEMOGLOBIN: 15.4 g/dL (ref 13.0–17.0)
MCHC: 34.7 g/dL (ref 30.0–36.0)
MCV: 90.8 fl (ref 78.0–100.0)
Platelets: 234 10*3/uL (ref 150.0–400.0)
RBC: 4.9 Mil/uL (ref 4.22–5.81)
RDW: 13.6 % (ref 11.5–15.5)
WBC: 6.8 10*3/uL (ref 4.0–10.5)

## 2016-06-06 LAB — COMPREHENSIVE METABOLIC PANEL
ALBUMIN: 4.6 g/dL (ref 3.5–5.2)
ALK PHOS: 93 U/L (ref 39–117)
ALT: 12 U/L (ref 0–53)
AST: 17 U/L (ref 0–37)
BUN: 21 mg/dL (ref 6–23)
CO2: 32 mEq/L (ref 19–32)
Calcium: 11 mg/dL — ABNORMAL HIGH (ref 8.4–10.5)
Chloride: 99 mEq/L (ref 96–112)
Creatinine, Ser: 1.39 mg/dL (ref 0.40–1.50)
GFR: 51.92 mL/min — AB (ref >60.00)
Glucose, Bld: 106 mg/dL — ABNORMAL HIGH (ref 70–99)
POTASSIUM: 4.2 meq/L (ref 3.5–5.1)
Sodium: 139 mEq/L (ref 135–145)
TOTAL PROTEIN: 7.2 g/dL (ref 6.0–8.3)
Total Bilirubin: 0.5 mg/dL (ref 0.2–1.2)

## 2016-06-06 NOTE — Progress Notes (Signed)
     HPI: Patient is an 83 yo male known to Dr. Carlean Purl. He has a history of colon adenomas but none on last colonoscopy in 2011. In 2016 patient was due for surveillance colonoscopy but he and Dr. Carlean Purl felt that Cologuard was reasonable option. Cologuard was done and negative.   Patient comes in today with bowel changes. In December he developed constipation with straining. This got progressively worse and in Feb he started laxatives. After a few doses of laxatives his BMs improved but haven't returned to normal. He is passing stool but it takes 3-4 trips to the bathroom to empty bowels and stools are very narrow. He hasn't seen any blood. No weight loss. He complains of bloating but no abdominal pain. No urinary symptoms. He hasn't made any medication or dietary changes to account for the bowel changes.    Past Medical History:  Diagnosis Date  . Arthritis   . Diverticulosis   . Fibromyalgia 1995  . Glaucoma   . Hyperlipidemia   . Hypertension   . Kidney stones   . Positive TB test   . Tubulovillous adenoma of colon     Patient's surgical history, family medical history, social history, medications and allergies were all reviewed in Epic    Physical Exam: BP 130/76   Pulse 86   Ht 5\' 9"  (1.753 m)   Wt 175 lb (79.4 kg)   BMI 25.84 kg/m   GENERAL: healthy appearing, well developed white male in NAD PSYCH: :Pleasant, cooperative, normal affect HEENT: Normocephalic, conjunctiva pink, mucous membranes moist, neck supple without masses CARDIAC:  RRR,, no peripheral edema PULM: Normal respiratory effort, lungs  ABDOMEN:  soft, nontender, nondistended, no obvious masses, no hepatomegaly,  normal bowel sounds SKIN:  turgor, no lesions seen Musculoskeletal:  Normal muscle tone, normal strength NEURO: Alert and oriented x 3, no focal neurologic deficits  ASSESSMENT and PLAN:  Very pleasant, healthy appearing 83 yo white male with bowel changes since December. No medication or  dietary changes. He has been constipated and having narrow stools. No abdominal pain or blood in stools. Colon adenomas in past but none on last colonoscopy in 2011. After a negative cologuard in 2016 he was removed from surveillance program .  -no recent labs. Will obtain some basic labs.  -Patient looks amazing for his age. He is willing and interested in a colonoscopy to evaluate this bowel therefore he will be scheduled for a colonoscopy with possible polypectomy.  The risks and benefits of the procedure were discussed and the patient agrees to proceed.  -Start daily citrucel   Tye Savoy , NP 06/06/2016, 2:40 PM

## 2016-06-06 NOTE — Patient Instructions (Signed)
Your physician has requested that you go to the basement for the following lab work before leaving today:CBC, [place].  Start Citracel fiber supplement daily.   You have been scheduled for a colonoscopy. Please follow written instructions given to you at your visit today.  Please pick up your prep supplies at the pharmacy within the next 1-3 days. If you use inhalers (even only as needed), please bring them with you on the day of your procedure. Your physician has requested that you go to www.startemmi.com and enter the access code given to you at your visit today. This web site gives a general overview about your procedure. However, you should still follow specific instructions given to you by our office regarding your preparation for the procedure.

## 2016-06-15 ENCOUNTER — Telehealth: Payer: Self-pay

## 2016-06-15 NOTE — Telephone Encounter (Signed)
Left message to return call 

## 2016-06-15 NOTE — Telephone Encounter (Signed)
-----   Message from Aaron Haven, MD sent at 06/15/2016 12:13 PM EDT ----- Please get patient set up for repeat lab work for recheck of calcium in the next 1-2 weeks. Thanks.

## 2016-06-15 NOTE — Telephone Encounter (Signed)
Patient notified and scheduled for labs. 

## 2016-06-22 ENCOUNTER — Other Ambulatory Visit (INDEPENDENT_AMBULATORY_CARE_PROVIDER_SITE_OTHER): Payer: Medicare Other

## 2016-06-22 ENCOUNTER — Encounter: Payer: Self-pay | Admitting: Internal Medicine

## 2016-06-22 DIAGNOSIS — I10 Essential (primary) hypertension: Secondary | ICD-10-CM | POA: Diagnosis not present

## 2016-06-22 LAB — BASIC METABOLIC PANEL
BUN: 19 mg/dL (ref 6–23)
CHLORIDE: 104 meq/L (ref 96–112)
CO2: 30 meq/L (ref 19–32)
Calcium: 10.6 mg/dL — ABNORMAL HIGH (ref 8.4–10.5)
Creatinine, Ser: 1.19 mg/dL (ref 0.40–1.50)
GFR: 62.1 mL/min (ref >60.00)
GLUCOSE: 147 mg/dL — AB (ref 70–99)
POTASSIUM: 4.6 meq/L (ref 3.5–5.1)
SODIUM: 140 meq/L (ref 135–145)

## 2016-06-23 ENCOUNTER — Ambulatory Visit: Payer: Medicare Other | Admitting: Internal Medicine

## 2016-07-03 ENCOUNTER — Telehealth: Payer: Self-pay | Admitting: Family Medicine

## 2016-07-04 ENCOUNTER — Telehealth: Payer: Self-pay | Admitting: *Deleted

## 2016-07-04 NOTE — Telephone Encounter (Signed)
Patient has requested lab results  Pt contact [phone]

## 2016-07-04 NOTE — Telephone Encounter (Signed)
See result note.  

## 2016-07-06 ENCOUNTER — Ambulatory Visit (AMBULATORY_SURGERY_CENTER): Payer: Medicare Other | Admitting: Internal Medicine

## 2016-07-06 ENCOUNTER — Other Ambulatory Visit: Payer: Self-pay | Admitting: Family Medicine

## 2016-07-06 ENCOUNTER — Encounter: Payer: Self-pay | Admitting: Internal Medicine

## 2016-07-06 VITALS — BP 108/64 | HR 48 | Temp 97.8°F | Resp 13 | Ht 69.0 in | Wt 175.0 lb

## 2016-07-06 DIAGNOSIS — R194 Change in bowel habit: Secondary | ICD-10-CM

## 2016-07-06 DIAGNOSIS — K5730 Diverticulosis of large intestine without perforation or abscess without bleeding: Secondary | ICD-10-CM | POA: Diagnosis not present

## 2016-07-06 DIAGNOSIS — K648 Other hemorrhoids: Secondary | ICD-10-CM

## 2016-07-06 MED ORDER — SODIUM CHLORIDE 0.9 % IV SOLN: 500.0000 mL | INTRAVENOUS | Status: DC

## 2016-07-06 NOTE — Progress Notes (Signed)
Report given to PACU, vss 

## 2016-07-06 NOTE — Patient Instructions (Addendum)
Nothing bad here.  Diverticulosis - thickened muscle rings and pouches in the colon wall. Please read the handout about this condition. This is likely cause of the changes in bowel habits.  Also have hemorrhoids as we knew.  No signs of cancer, polyps.  Please eat a high fiber diet and take fiber supplement.  I appreciate the opportunity to care for you. Gatha Mayer, MD, [facility]   Discharge instructions given. Handouts on diverticulosis and hemorrhoids. Resume previous medications. YOU HAD AN ENDOSCOPIC PROCEDURE TODAY AT [facility]:   Refer to the procedure report that was given to you for any specific questions about what was found during the examination.  If the procedure report does not answer your questions, please call your gastroenterologist to clarify.  If you requested that your care partner not be given the details of your procedure findings, then the procedure report has been included in a sealed envelope for you to review at your convenience later.  YOU SHOULD EXPECT: Some feelings of bloating in the abdomen. Passage of more gas than usual.  Walking can help get rid of the air that was put into your GI tract during the procedure and reduce the bloating. If you had a lower endoscopy (such as a colonoscopy or flexible sigmoidoscopy) you may notice spotting of blood in your stool or on the toilet paper. If you underwent a bowel prep for your procedure, you may not have a normal bowel movement for a few days.  Please Note:  You might notice some irritation and congestion in your nose or some drainage.  This is from the oxygen used during your procedure.  There is no need for concern and it should clear up in a day or so.  SYMPTOMS TO REPORT IMMEDIATELY:   Following lower endoscopy (colonoscopy or flexible sigmoidoscopy):  Excessive amounts of blood in the stool  Significant tenderness or worsening of abdominal pains  Swelling of the abdomen that  is new, acute  Fever of 100F or higher   For urgent or emergent issues, a gastroenterologist can be reached at any hour by calling [phone].   DIET:  We do recommend a small meal at first, but then you may proceed to your regular diet.  Drink plenty of fluids but you should avoid alcoholic beverages for 24 hours.  ACTIVITY:  You should plan to take it easy for the rest of today and you should NOT DRIVE or use heavy machinery until tomorrow (because of the sedation medicines used during the test).    FOLLOW UP: Our staff will call the number listed on your records the next business day following your procedure to check on you and address any questions or concerns that you may have regarding the information given to you following your procedure. If we do not reach you, we will leave a message.  However, if you are feeling well and you are not experiencing any problems, there is no need to return our call.  We will assume that you have returned to your regular daily activities without incident.  If any biopsies were taken you will be contacted by phone or by letter within the next 1-3 weeks.  Please call us at [phone] if you have not heard about the biopsies in 3 weeks.    SIGNATURES/CONFIDENTIALITY: You and/or your care partner have signed paperwork which will be entered into your electronic medical record.  These signatures attest  to the fact that that the information above on your After Visit Summary has been reviewed and is understood.  Full responsibility of the confidentiality of this discharge information lies with you and/or your care-partner. 

## 2016-07-06 NOTE — Progress Notes (Signed)
Medical chart reviewed in admitting.

## 2016-07-06 NOTE — Op Note (Signed)
[facility] Patient Name: Aaron Quinn Procedure Date: 07/06/2016 3:35 PM MRN: [id number] Endoscopist: Gatha Mayer , MD Age: 83 Referring MD:  Date of Birth: 1932/03/02 Gender: Male Account #: [id number] Procedure:                Colonoscopy Indications:              Change in bowel habits Medicines:                Propofol per Anesthesia, Monitored Anesthesia Care Procedure:                Pre-Anesthesia Assessment:                           - Prior to the procedure, a History and Physical                            was performed, and patient medications and                            allergies were reviewed. The patient's tolerance of                            previous anesthesia was also reviewed. The risks                            and benefits of the procedure and the sedation                            options and risks were discussed with the patient.                            All questions were answered, and informed consent                            was obtained. Prior Anticoagulants: The patient                            last took previous NSAID medication on the day of                            the procedure. ASA Grade Assessment: II - A patient                            with mild systemic disease. After reviewing the                            risks and benefits, the patient was deemed in                            satisfactory condition to undergo the procedure.                           After obtaining informed consent, the colonoscope  was passed under direct vision. Throughout the                            procedure, the patient's blood pressure, pulse, and                            oxygen saturations were monitored continuously. The                            Model CF-HQ190L [id number]) scope was introduced                            through the anus and advanced to the the cecum,                            identified  by appendiceal orifice and ileocecal                            valve. The colonoscopy was performed without                            difficulty. The patient tolerated the procedure                            well. The quality of the bowel preparation was                            excellent. The ileocecal valve, appendiceal                            orifice, and rectum were photographed. The bowel                            preparation used was Miralax. Scope In: 4:00:10 PM Scope Out: 4:12:31 PM Scope Withdrawal Time: 0 hours 8 minutes 12 seconds  Total Procedure Duration: 0 hours 12 minutes 21 seconds  Findings:                 The perianal and digital rectal examinations were                            normal.                           Many small and large-mouthed diverticula were found                            in the sigmoid colon. There was narrowing of the                            colon in association with the diverticular opening.                           Internal hemorrhoids were found during retroflexion.  The exam was otherwise without abnormality on                            direct and retroflexion views. Complications:            No immediate complications. Estimated Blood Loss:     Estimated blood loss: none. Impression:               - Mild diverticulosis in the sigmoid colon. There                            was narrowing of the colon in association with the                            diverticular opening.                           - Internal hemorrhoids.                           - The examination was otherwise normal on direct                            and retroflexion views.                           - No specimens collected. Recommendation:           - Patient has a contact number available for                            emergencies. The signs and symptoms of potential                            delayed complications were discussed  with the                            patient. Return to normal activities tomorrow.                            Written discharge instructions were provided to the                            patient.                           - High fiber diet.                           - Continue present medications.                           - No repeat colonoscopy due to age.                           - Use Citrucel one tablespoon PO daily. Gatha Mayer, MD 07/06/2016 4:19:39 PM This report has been signed electronically.

## 2016-07-07 ENCOUNTER — Telehealth: Payer: Self-pay | Admitting: *Deleted

## 2016-07-07 NOTE — Telephone Encounter (Signed)
  Follow up Call-  Call back number 07/06/2016  Post procedure Call Back phone  # [phone]  Permission to leave phone message Yes  Some recent data might be hidden    Spoke with wife Patient questions:  Do you have a fever, pain , or abdominal swelling? No. Pain Score  0 *  Have you tolerated food without any problems? Yes.    Have you been able to return to your normal activities? Yes.    Do you have any questions about your discharge instructions: Diet   No. Medications  No. Follow up visit  No.  Do you have questions or concerns about your Care? No.  Actions: * If pain score is 4 or above: No action needed, pain <4.

## 2016-07-17 ENCOUNTER — Telehealth: Payer: Self-pay

## 2016-07-17 MED ORDER — ZOLPIDEM TARTRATE 10 MG PO TABS: 10.0000 mg | ORAL_TABLET | Freq: Every evening | ORAL | 0 refills | Status: DC | PRN

## 2016-07-17 NOTE — Telephone Encounter (Signed)
Last OV 04/11/16 last filled 04/20/16 90 0rf

## 2016-07-18 ENCOUNTER — Other Ambulatory Visit (INDEPENDENT_AMBULATORY_CARE_PROVIDER_SITE_OTHER): Payer: Medicare Other

## 2016-07-18 NOTE — Telephone Encounter (Signed)
Pt called to follow up on the Rx. Please advise?  Call pt @ [phone]. Thank you!

## 2016-07-18 NOTE — Telephone Encounter (Signed)
faxed

## 2016-07-18 NOTE — Telephone Encounter (Signed)
Patient notified

## 2016-07-19 ENCOUNTER — Telehealth: Payer: Self-pay

## 2016-07-19 DIAGNOSIS — R7989 Other specified abnormal findings of blood chemistry: Secondary | ICD-10-CM

## 2016-07-19 LAB — PTH, INTACT AND CALCIUM
CALCIUM: 10 mg/dL (ref 8.6–10.3)
PTH: 81 pg/mL — ABNORMAL HIGH (ref 14–64)

## 2016-07-19 NOTE — Telephone Encounter (Signed)
Left message to return call 

## 2016-07-19 NOTE — Telephone Encounter (Signed)
-----   Message from Leone Haven, MD sent at 07/19/2016  3:15 PM EDT ----- Please let the patient know his calcium is back to normal although we checked a parathyroid hormone given his prior elevation of calcium and this is elevated. I would like to refer to endocrinology for evaluation of this. If he is willing I can place a referral. Thanks.

## 2016-07-20 NOTE — Telephone Encounter (Signed)
Referral placed. We will forward this message to [facility].

## 2016-07-20 NOTE — Telephone Encounter (Signed)
Pt called back returning your call. Pt will be available for the next 30-45 minutes.   Call pt @ [phone]

## 2016-07-20 NOTE — Telephone Encounter (Signed)
Pt called back returning your call. Thank you! °

## 2016-07-20 NOTE — Telephone Encounter (Signed)
Patient notified and would like the referral but would like referral appointment to be made for the end of june

## 2016-07-24 ENCOUNTER — Telehealth: Payer: Self-pay | Admitting: *Deleted

## 2016-07-24 NOTE — Telephone Encounter (Signed)
Patient was wondering about the status of his referral informed him he will be contacted with the referral within the next week

## 2016-07-24 NOTE — Telephone Encounter (Signed)
Patient requested a call, in reference to his referral to endocrinology.  Pt contact [phone]

## 2016-08-22 ENCOUNTER — Other Ambulatory Visit: Payer: Self-pay | Admitting: Family Medicine

## 2016-10-10 ENCOUNTER — Other Ambulatory Visit: Payer: Self-pay

## 2016-10-10 NOTE — Telephone Encounter (Signed)
Last OV 04/11/16 last filled 07/17/16 90 0rf

## 2016-10-10 NOTE — Telephone Encounter (Signed)
Refill given. Patient needs follow-up scheduled. Thanks.

## 2016-10-11 MED ORDER — ZOLPIDEM TARTRATE 10 MG PO TABS: 10.0000 mg | ORAL_TABLET | Freq: Every evening | ORAL | 0 refills | Status: DC | PRN

## 2016-11-02 ENCOUNTER — Ambulatory Visit (INDEPENDENT_AMBULATORY_CARE_PROVIDER_SITE_OTHER): Payer: Medicare Other | Admitting: Family Medicine

## 2016-11-02 ENCOUNTER — Encounter: Payer: Self-pay | Admitting: Family Medicine

## 2016-11-02 VITALS — BP 130/72 | HR 54 | Temp 98.8°F | Wt 179.6 lb

## 2016-11-02 DIAGNOSIS — R7303 Prediabetes: Secondary | ICD-10-CM | POA: Diagnosis not present

## 2016-11-02 DIAGNOSIS — F5101 Primary insomnia: Secondary | ICD-10-CM

## 2016-11-02 DIAGNOSIS — I10 Essential (primary) hypertension: Secondary | ICD-10-CM

## 2016-11-02 DIAGNOSIS — M797 Fibromyalgia: Secondary | ICD-10-CM | POA: Diagnosis not present

## 2016-11-02 DIAGNOSIS — E785 Hyperlipidemia, unspecified: Secondary | ICD-10-CM

## 2016-11-02 LAB — COMPREHENSIVE METABOLIC PANEL
ALBUMIN: 4.2 g/dL (ref 3.5–5.2)
ALK PHOS: 91 U/L (ref 39–117)
ALT: 12 U/L (ref 0–53)
AST: 16 U/L (ref 0–37)
BUN: 17 mg/dL (ref 6–23)
CHLORIDE: 102 meq/L (ref 96–112)
CO2: 30 mEq/L (ref 19–32)
CREATININE: 1.04 mg/dL (ref 0.40–1.50)
Calcium: 10.4 mg/dL (ref 8.4–10.5)
GFR: 72.49 mL/min (ref >60.00)
Glucose, Bld: 102 mg/dL — ABNORMAL HIGH (ref 70–99)
Potassium: 4.7 mEq/L (ref 3.5–5.1)
SODIUM: 137 meq/L (ref 135–145)
TOTAL PROTEIN: 6.7 g/dL (ref 6.0–8.3)
Total Bilirubin: 0.6 mg/dL (ref 0.2–1.2)

## 2016-11-02 LAB — LIPID PANEL
Cholesterol: 184 mg/dL (ref 0–200)
HDL: 37.5 mg/dL — AB (ref >39.00)
NonHDL: 146.73
Total CHOL/HDL Ratio: 5
Triglycerides: 377 mg/dL — ABNORMAL HIGH (ref 0.0–149.0)
VLDL: 75.4 mg/dL — ABNORMAL HIGH (ref 0.0–40.0)

## 2016-11-02 LAB — HEMOGLOBIN A1C: Hgb A1c MFr Bld: 6 % (ref 4.6–6.5)

## 2016-11-02 LAB — TSH: TSH: 4.92 u[IU]/mL — AB (ref 0.35–4.50)

## 2016-11-02 LAB — LDL CHOLESTEROL, DIRECT: LDL DIRECT: 101 mg/dL

## 2016-11-02 NOTE — Progress Notes (Signed)
  [name], MD Phone: [phone]  Aaron Quinn is a 83 y.o. male who presents today for follow-up.  Hypertension: Currently taking hydrochlorothiazide, amlodipine, and metoprolol. He was recently started on metoprolol by his neurologist. Typically runs at home prior to starting on the metoprolol 120-146 over 60s. He is no longer on clonidine. No chest pain, shortness breath, or edema.   Insomnia: Taking Ambien. Notes he sleeps well with this. Gets 8 hours of sleep. Rarely gets drowsy. No strange activities at night. If he doesn't take it can't sleep.  Prediabetes: No polyuria or polydipsia. Tries to eat healthily. Not able to exercise much given his fibromyalgia.  Fibromyalgia: Patient notes this has been getting worse. His neurologist put him back on Lyrica. Notes this has helped somewhat over the last few months. He just aches at times worse than others. He notes he feels drained of energy at times as well. Hasn't noticed difference with that since going on metoprolol.  PMH: Former smoker   ROS see history of present illness  Objective  Physical Exam Vitals:   11/02/16 0804 11/02/16 0820  BP: (!) 160/98 130/72  Pulse: (!) 51 (!) 54  Temp: 98.8 F (37.1 C)   SpO2: 95%     BP Readings from Last 3 Encounters:  11/02/16 130/72  07/06/16 108/64  06/06/16 130/76   Wt Readings from Last 3 Encounters:  11/02/16 179 lb 9.6 oz (81.5 kg)  07/06/16 175 lb (79.4 kg)  06/06/16 175 lb (79.4 kg)    Physical Exam  Constitutional: No distress.  HENT:  Head: Normocephalic and atraumatic.  Cardiovascular: Regular rhythm and normal heart sounds.  Bradycardia present.   Pulmonary/Chest: Effort normal and breath sounds normal.  Musculoskeletal: He exhibits no edema.  Neurological: He is alert. Gait normal.  Skin: Skin is warm and dry. He is not diaphoretic.     Assessment/Plan: Please see individual problem list.  Essential hypertension Blood pressures have been at  goal at home prior to starting metoprolol and was well-controlled on recheck today without having taken his medications. He is bradycardic today and I suspect that's related to the recent starting of metoprolol. He is asymptomatic. We'll discontinue the metoprolol. He'll start monitoring his blood pressure at home. We will have him return in 1 week for recheck of blood pressure and pulse. Lab work as outlined below.  Fibromyalgia Has progressively worsened. Is followed by neurology for this. Suspect this is contributing to his decreased energy level as well as the metoprolol. He will continue to follow with neurology. We will discontinue metoprolol.  HLD (hyperlipidemia) Check lipid panel.  Insomnia Tolerating Ambien. He will continue this.  Prediabetes Prior A1c 5.9. Plan to recheck today.   Orders Placed This Encounter  Procedures  . Comp Met (CMET)  . TSH  . HgB A1c  . Lipid Profile    Meds ordered this encounter  Medications  . loratadine (CLARITIN) 10 MG tablet    Sig: Take 10 mg by mouth daily.  Marland Kitchen LYRICA 25 MG capsule    Sig: Take 25 mg by mouth 2 (two) times daily.    Refill:  [place], MD [place]

## 2016-11-02 NOTE — Patient Instructions (Signed)
Nice to see you. We'll get some lab work today and contact you with the results. Please discontinue the metoprolol. This could contribute to decreased energy levels and is certainly contributing to your low pulse rate. We'll have you return in 1 week for recheck of your blood pressure and pulse.

## 2016-11-02 NOTE — Assessment & Plan Note (Signed)
Check lipid panel  

## 2016-11-02 NOTE — Assessment & Plan Note (Addendum)
Tolerating Ambien. He will continue this.

## 2016-11-02 NOTE — Assessment & Plan Note (Addendum)
Has progressively worsened. Is followed by neurology for this. Suspect this is contributing to his decreased energy level as well as the metoprolol. He will continue to follow with neurology. We will discontinue metoprolol.

## 2016-11-02 NOTE — Assessment & Plan Note (Signed)
Blood pressures have been at goal at home prior to starting metoprolol and was well-controlled on recheck today without having taken his medications. He is bradycardic today and I suspect that's related to the recent starting of metoprolol. He is asymptomatic. We'll discontinue the metoprolol. He'll start monitoring his blood pressure at home. We will have him return in 1 week for recheck of blood pressure and pulse. Lab work as outlined below.

## 2016-11-02 NOTE — Assessment & Plan Note (Signed)
Prior A1c 5.9. Plan to recheck today.

## 2016-11-05 ENCOUNTER — Other Ambulatory Visit: Payer: Self-pay | Admitting: Family Medicine

## 2016-11-05 DIAGNOSIS — R7989 Other specified abnormal findings of blood chemistry: Secondary | ICD-10-CM

## 2016-11-05 DIAGNOSIS — E781 Pure hyperglyceridemia: Secondary | ICD-10-CM

## 2016-11-09 ENCOUNTER — Ambulatory Visit (INDEPENDENT_AMBULATORY_CARE_PROVIDER_SITE_OTHER): Payer: Medicare Other | Admitting: *Deleted

## 2016-11-09 VITALS — BP 122/78 | HR 66 | Resp 18

## 2016-11-09 DIAGNOSIS — I10 Essential (primary) hypertension: Secondary | ICD-10-CM | POA: Diagnosis not present

## 2016-11-09 NOTE — Progress Notes (Signed)
BP adequately controlled. Continue current medications.  [name], M.D.

## 2016-11-09 NOTE — Progress Notes (Signed)
Patient presented for BP check and pulse check from last OV, patient BP left arm 118/78 pulse 67 in right arm BP 122/78 pulse 66, Patient had taken al BP medications as ordered.  Patient left copy of home readings given to CMA.

## 2016-11-09 NOTE — Progress Notes (Signed)
Patient notified and voiced understanding.

## 2016-12-05 ENCOUNTER — Telehealth: Payer: Self-pay | Admitting: Family Medicine

## 2016-12-05 NOTE — Telephone Encounter (Signed)
Patient can stop the Singulair. He should monitor his symptoms once off of this.

## 2016-12-05 NOTE — Telephone Encounter (Signed)
Please advise 

## 2016-12-05 NOTE — Telephone Encounter (Signed)
Patient notified

## 2016-12-05 NOTE — Telephone Encounter (Signed)
Pt called and stated that the montelukast (SINGULAIR) 10 MG tablet is interfering with the  zolpidem (AMBIEN) 10 MG tablet(Expired). Pt states that he is not sleeping. Please advise, thank you!  Call pt @ [phone]

## 2016-12-11 ENCOUNTER — Other Ambulatory Visit (INDEPENDENT_AMBULATORY_CARE_PROVIDER_SITE_OTHER): Payer: Medicare Other

## 2016-12-11 DIAGNOSIS — R7989 Other specified abnormal findings of blood chemistry: Secondary | ICD-10-CM

## 2016-12-11 DIAGNOSIS — R946 Abnormal results of thyroid function studies: Secondary | ICD-10-CM | POA: Diagnosis not present

## 2016-12-11 DIAGNOSIS — E781 Pure hyperglyceridemia: Secondary | ICD-10-CM

## 2016-12-11 LAB — LIPID PANEL
CHOL/HDL RATIO: 5
Cholesterol: 212 mg/dL — ABNORMAL HIGH (ref 0–200)
HDL: 45.1 mg/dL (ref >39.00)
NONHDL: 166.86
TRIGLYCERIDES: 259 mg/dL — AB (ref 0.0–149.0)
VLDL: 51.8 mg/dL — AB (ref 0.0–40.0)

## 2016-12-11 LAB — TSH: TSH: 4.2 u[IU]/mL (ref 0.35–4.50)

## 2016-12-11 LAB — LDL CHOLESTEROL, DIRECT: LDL DIRECT: 120 mg/dL

## 2016-12-20 ENCOUNTER — Telehealth: Payer: Self-pay | Admitting: Family Medicine

## 2016-12-20 ENCOUNTER — Ambulatory Visit (INDEPENDENT_AMBULATORY_CARE_PROVIDER_SITE_OTHER): Payer: Medicare Other | Admitting: Family Medicine

## 2016-12-20 ENCOUNTER — Encounter: Payer: Self-pay | Admitting: Family Medicine

## 2016-12-20 VITALS — BP 128/70 | HR 67 | Temp 98.3°F | Resp 16

## 2016-12-20 DIAGNOSIS — R319 Hematuria, unspecified: Secondary | ICD-10-CM

## 2016-12-20 LAB — POCT URINALYSIS DIPSTICK
BILIRUBIN UA: NEGATIVE
GLUCOSE UA: NEGATIVE
Ketones, UA: NEGATIVE
Leukocytes, UA: NEGATIVE
Nitrite, UA: NEGATIVE
Protein, UA: NEGATIVE
Spec Grav, UA: 1.02 (ref 1.010–1.025)
Urobilinogen, UA: 0.2 E.U./dL
pH, UA: 7 (ref 5.0–8.0)

## 2016-12-20 LAB — URINALYSIS, MICROSCOPIC ONLY

## 2016-12-20 NOTE — Telephone Encounter (Signed)
[facility] called stating that pt will be coming in due to having blood in his urine.

## 2016-12-20 NOTE — Assessment & Plan Note (Signed)
Patient with painless hematuria. We will obtain urine microscopy and urine culture. Likely refer to urology once these return.

## 2016-12-20 NOTE — Telephone Encounter (Signed)
Seen in the office

## 2016-12-20 NOTE — Telephone Encounter (Signed)
fyi

## 2016-12-20 NOTE — Progress Notes (Signed)
  [name], MD Phone: [phone]  Aaron Quinn is a 83 y.o. male who presents today for same day visit.  Patient notes 1 day last week he had urine that was darker than usual. It was reddish in color. Occurred once during that day and then it was normal rest of the day. The next day the same thing happened on one occurrence and then was normal the rest of the day. He went several days until yesterday without any issues and noted dark urine. Also noted some stringy red material. He notes no pain with urination. Some frequency and urgency. No abdominal pain or back pain. No penile discharge. He does have a history of kidney stone. This does not feel similar to his prior kidney stone.  PMH: Former smoker. 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   ROS see history of present illness  Objective  Physical Exam Vitals:   12/20/16 1013  BP: 128/70  Pulse: 67  Resp: 16  Temp: 98.3 F (36.8 C)  SpO2: 96%    BP Readings from Last 3 Encounters:  12/20/16 128/70  11/09/16 122/78  11/02/16 130/72   Wt Readings from Last 3 Encounters:  11/02/16 179 lb 9.6 oz (81.5 kg)  07/06/16 175 lb (79.4 kg)  06/06/16 175 lb (79.4 kg)    Physical Exam  Constitutional: No distress.  Cardiovascular: Normal rate, regular rhythm and normal heart sounds.   Pulmonary/Chest: Effort normal and breath sounds normal.  Abdominal: Soft. Bowel sounds are normal. He exhibits no distension. There is no tenderness.  Skin: Skin is warm. He is not diaphoretic.     Assessment/Plan: Please see individual problem list.  Hematuria Patient with painless hematuria. We will obtain urine microscopy and urine culture. Likely refer to urology once these return.   Orders Placed This Encounter  Procedures  . Urine Culture  . Urine Microscopic Only  . POCT Urinalysis Dipstick    [name], MD [place]

## 2016-12-20 NOTE — Patient Instructions (Signed)
Nice to see you. We'll contact you with the results. If you develop pain, gross blood in your urine, or any new or changing symptoms please be evaluated.

## 2016-12-21 ENCOUNTER — Other Ambulatory Visit: Payer: Self-pay | Admitting: Family Medicine

## 2016-12-21 DIAGNOSIS — R319 Hematuria, unspecified: Secondary | ICD-10-CM

## 2016-12-21 LAB — URINE CULTURE
MICRO NUMBER:: 81098366
RESULT: NO GROWTH
SPECIMEN QUALITY:: ADEQUATE

## 2017-01-08 ENCOUNTER — Ambulatory Visit: Payer: Medicare Other | Admitting: Family Medicine

## 2017-01-11 ENCOUNTER — Telehealth: Payer: Self-pay | Admitting: Family Medicine

## 2017-01-11 DIAGNOSIS — F5101 Primary insomnia: Secondary | ICD-10-CM

## 2017-01-11 NOTE — Telephone Encounter (Signed)
Pt called stating that he has been urinating red since last night. No other symptoms. Sent call to [facility] triage.  Call pt @ [phone]

## 2017-01-11 NOTE — Telephone Encounter (Signed)
Noted  

## 2017-01-11 NOTE — Telephone Encounter (Signed)
Patient Name: Aaron Quinn DOB: Mar 25, 1931 Initial Comment Caller states he has blood in his urine. It's red when he urinates. Nurse Assessment Nurse: Vallery Sa, RN, [place] Date/Time (Eastern Time): 01/11/2017 8:21:29 AM Confirm and document reason for call. If symptomatic, describe symptoms. ---Denyse Amass states he developed blood in his urine again last night. No injury in the past 3 days. No fever. Alert and responsive. Does the patient have any new or worsening symptoms? ---Yes Will a triage be completed? ---Yes Related visit to physician within the last 2 weeks? ---No Does the PT have any chronic conditions? (i.e. diabetes, asthma, etc.) ---Yes List chronic conditions. ---Fibromyalgia, Leg pain Is this a behavioral health or substance abuse call? ---No Guidelines Guideline Title Affirmed Question Affirmed Notes Urine - Blood In Blood in urine (Exception: could be normal menstrual bleeding) Final Disposition User See Physician within [place], RN, Tye [place] Comments No appointments available at [facility]. Scheduled for 7:15am with Dr. Silvio Pate at [facility] per Bevely Palmer. Referrals REFERRED TO PCP OFFICE Caller Disagree/Comply Comply Caller Understands Yes PreDisposition Call Doctor

## 2017-01-11 NOTE — Telephone Encounter (Signed)
Patient has been scheduled with Dr. Silvio Pate per [place]

## 2017-01-12 ENCOUNTER — Ambulatory Visit: Payer: Medicare Other | Admitting: Internal Medicine

## 2017-01-12 DIAGNOSIS — Z0289 Encounter for other administrative examinations: Secondary | ICD-10-CM

## 2017-01-15 ENCOUNTER — Ambulatory Visit (INDEPENDENT_AMBULATORY_CARE_PROVIDER_SITE_OTHER): Payer: Medicare Other | Admitting: Urology

## 2017-01-15 ENCOUNTER — Other Ambulatory Visit: Payer: Self-pay | Admitting: Acute Care

## 2017-01-15 ENCOUNTER — Encounter: Payer: Self-pay | Admitting: Urology

## 2017-01-15 VITALS — BP 145/71 | HR 62 | Ht 69.0 in | Wt 177.6 lb

## 2017-01-15 DIAGNOSIS — M5441 Lumbago with sciatica, right side: Principal | ICD-10-CM

## 2017-01-15 DIAGNOSIS — R3129 Other microscopic hematuria: Secondary | ICD-10-CM | POA: Diagnosis not present

## 2017-01-15 DIAGNOSIS — G8929 Other chronic pain: Secondary | ICD-10-CM

## 2017-01-15 LAB — URINALYSIS, COMPLETE
Bilirubin, UA: NEGATIVE
Glucose, UA: NEGATIVE
Ketones, UA: NEGATIVE
Leukocytes, UA: NEGATIVE
Nitrite, UA: NEGATIVE
PH UA: 5.5 (ref 5.0–7.5)
Specific Gravity, UA: >1.03 — ABNORMAL HIGH (ref 1.005–1.030)
Urobilinogen, Ur: 0.2 mg/dL (ref 0.2–1.0)

## 2017-01-15 LAB — MICROSCOPIC EXAMINATION: EPITHELIAL CELLS (NON RENAL): NONE SEEN /HPF (ref 0–10)

## 2017-01-15 NOTE — Progress Notes (Signed)
01/15/2017 9:30 AM   Aaron Quinn 17-Sep-1931 [id number]  Referring provider: Leone Haven, MD [address] [place], [place] [address]  Chief Complaint  Patient presents with  . Hematuria    HPI: I was consulted to assess the patient's recent episode of a small amount of blood in the urine not associated with pain or frequency.  He was also told he had microscopic hematuria.  He voids every 3 hours gets up once at night.  His flow is reasonable.  He recently stopped daily aspirin.  He is not on blood thinners.  He smoked 30 years ago  He may have had a kidney stone years ago.  He has not had bladder or prostate surgery.  He does not get bladder infections  He has no neurologic issues.  His bowel movements are normal  Modifying factors: There are no other modifying factors  Associated signs and symptoms: There are no other associated signs and symptoms Aggravating and relieving factors: There are no other aggravating or relieving factors Severity: Moderate Duration: Persistent   PMH: Past Medical History:  Diagnosis Date  . Arthritis   . Diverticulosis   . Fibromyalgia 1995  . Glaucoma   . Hyperlipidemia   . Hypertension   . Kidney stones   . Positive TB test   . Tuberculosis   . Tubulovillous adenoma of colon     Surgical History: Past Surgical History:  Procedure Laterality Date  . APPENDECTOMY  2005  . CATARACT EXTRACTION, BILATERAL  2012  . COLONOSCOPY W/ BIOPSIES    . HEMORRHOID BANDING    . TONSILLECTOMY  1940    Home Medications:  Allergies as of 01/15/2017   No Known Allergies     Medication List       Accurate as of 01/15/17  9:30 AM. Always use your most recent med list.          amLODipine 2.5 MG tablet Commonly known as:  NORVASC Take by mouth.   aspirin 81 MG tablet Take 81 mg by mouth daily.   citalopram 10 MG tablet Commonly known as:  CELEXA Take 20 mg by mouth daily.   donepezil 5 MG tablet Commonly  known as:  ARICEPT Take 5 mg by mouth daily.   FLONASE 50 MCG/ACT nasal spray Generic drug:  fluticasone Place into both nostrils daily.   gabapentin 400 MG capsule Commonly known as:  NEURONTIN Take 400 mg by mouth daily.   hydrochlorothiazide 25 MG tablet Commonly known as:  HYDRODIURIL TAKE 1 TABLET DAILY   ketoconazole 2 % shampoo Commonly known as:  NIZORAL SHAMPOO INTO SCALP TWICE PER WEEK.   loratadine 10 MG tablet Commonly known as:  CLARITIN Take 10 mg by mouth daily.   LYRICA 25 MG capsule Generic drug:  pregabalin Take 25 mg by mouth 2 (two) times daily.   meloxicam 15 MG tablet Commonly known as:  MOBIC Take 15 mg by mouth daily. with food   montelukast 10 MG tablet Commonly known as:  SINGULAIR Take 1 tablet (10 mg total) by mouth at bedtime.   REQUIP XL 2 MG 24 hr tablet Generic drug:  rOPINIRole Take 1 tablet daily for 1 week, then increase to 1 tablet twice daily and continue this dose   traZODone 50 MG tablet Commonly known as:  DESYREL Take 50 mg by mouth daily.       Allergies: No Known Allergies  Family History: Family History  Problem Relation Age of Onset  .  Colon cancer Maternal Grandmother 81       Died at 46  . Heart disease Father   . Arthritis Unknown        Parent  . Hypertension Unknown        Parent, grandparent  . Diabetes Son   . Alcohol abuse Son   . Depression Son   . Colon polyps Neg Hx   . Kidney disease Neg Hx   . Esophageal cancer Neg Hx   . Gallbladder disease Neg Hx     Social History:  reports that he quit smoking about 34 years ago. His smoking use included Cigarettes. He has a 10.00 pack-year smoking history. He has never used smokeless tobacco. He reports that he does not drink alcohol or use drugs.  ROS: UROLOGY Frequent Urination?: No Hard to postpone urination?: Yes Burning/pain with urination?: No Get up at night to urinate?: Yes Leakage of urine?: Yes Urine stream starts and stops?:  Yes Trouble starting stream?: No Do you have to strain to urinate?: No Blood in urine?: Yes Urinary tract infection?: No Sexually transmitted disease?: No Injury to kidneys or bladder?: No Painful intercourse?: No Weak stream?: No Erection problems?: Yes Penile pain?: Yes  Gastrointestinal Nausea?: No Vomiting?: No Indigestion/heartburn?: Yes Diarrhea?: No Constipation?: No  Constitutional Fever: No Night sweats?: Yes Weight loss?: No Fatigue?: Yes  Skin Skin rash/lesions?: No Itching?: No  Eyes Blurred vision?: No Double vision?: No  Ears/Nose/Throat Sore throat?: No Sinus problems?: No  Hematologic/Lymphatic Swollen glands?: No Easy bruising?: No  Cardiovascular Leg swelling?: No Chest pain?: No  Respiratory Cough?: No Shortness of breath?: No  Endocrine Excessive thirst?: No  Musculoskeletal Back pain?: Yes Joint pain?: Yes  Neurological Headaches?: No Dizziness?: Yes  Psychologic Depression?: No Anxiety?: No  Physical Exam: BP (!) 145/71 (BP Location: Right Arm, Patient Position: Sitting, Cuff Size: Normal)   Pulse 62   Ht 5\' 9"  (1.753 m)   Wt 177 lb 9.6 oz (80.6 kg)   BMI 26.23 kg/m   Constitutional:  Alert and oriented, No acute distress. HEENT: [place] AT, moist mucus membranes.  Trachea midline, no masses. Cardiovascular: No clubbing, cyanosis, or edema. Respiratory: Normal respiratory effort, no increased work of breathing. GI: Abdomen is soft, nontender, nondistended, no abdominal masses GU: Normal male genitalia and 40 g benign proximal Skin: No rashes, bruises or suspicious lesions. Lymph: No cervical or inguinal adenopathy. Neurologic: Grossly intact, no focal deficits, moving all 4 extremities. Psychiatric: Normal mood and affect.  Laboratory Data: Lab Results  Component Value Date   WBC 6.8 06/06/2016   HGB 15.4 06/06/2016   HCT 44.5 06/06/2016   MCV 90.8 06/06/2016   PLT 234.0 06/06/2016    Lab Results  Component  Value Date   CREATININE 1.04 11/02/2016    No results found for: PSA  No results found for: TESTOSTERONE  Lab Results  Component Value Date   HGBA1C 6.0 11/02/2016    Urinalysis    Component Value Date/Time   COLORURINE Yellow 02/02/2013 1315   APPEARANCEUR Clear 02/02/2013 1315   LABSPEC 1.013 02/02/2013 1315   PHURINE 6.0 02/02/2013 1315   GLUCOSEU Negative 02/02/2013 1315   HGBUR Negative 02/02/2013 1315   BILIRUBINUR negative 12/20/2016 1027   BILIRUBINUR Negative 02/02/2013 1315   KETONESUR Negative 02/02/2013 1315   PROTEINUR negative 12/20/2016 1027   PROTEINUR Negative 02/02/2013 1315   UROBILINOGEN 0.2 12/20/2016 1027   NITRITE negative 12/20/2016 1027   NITRITE Negative 02/02/2013 1315   LEUKOCYTESUR Negative  12/20/2016 1027   LEUKOCYTESUR Negative 02/02/2013 1315    Pertinent Imaging: None  Assessment & Plan: The patient will return with a CT scan and cystoscopy and we will proceed accordingly.  Urine was sent for culture  1. Microscopic hematuria 2.  Weakened urine stream 3.  Nocturia - Urinalysis, Complete   No Follow-up on file.  Reece Packer, MD  [facility] [address], [place], [place] [address] [phone]

## 2017-01-16 LAB — BUN+CREAT
BUN/Creatinine Ratio: 17 (ref 10–24)
BUN: 19 mg/dL (ref 8–27)
Creatinine, Ser: 1.14 mg/dL (ref 0.76–1.27)
GFR, EST AFRICAN AMERICAN: 68 mL/min/{1.73_m2} (ref >59)
GFR, EST NON AFRICAN AMERICAN: 59 mL/min/{1.73_m2} — AB (ref >59)

## 2017-01-17 ENCOUNTER — Encounter: Payer: Self-pay | Admitting: Family Medicine

## 2017-01-17 ENCOUNTER — Ambulatory Visit (INDEPENDENT_AMBULATORY_CARE_PROVIDER_SITE_OTHER): Payer: Medicare Other | Admitting: Family Medicine

## 2017-01-17 VITALS — BP 158/80 | HR 58 | Temp 98.5°F | Wt 181.6 lb

## 2017-01-17 DIAGNOSIS — I10 Essential (primary) hypertension: Secondary | ICD-10-CM | POA: Diagnosis not present

## 2017-01-17 DIAGNOSIS — F5101 Primary insomnia: Secondary | ICD-10-CM | POA: Diagnosis not present

## 2017-01-17 DIAGNOSIS — R42 Dizziness and giddiness: Secondary | ICD-10-CM

## 2017-01-17 DIAGNOSIS — G629 Polyneuropathy, unspecified: Secondary | ICD-10-CM | POA: Diagnosis not present

## 2017-01-17 DIAGNOSIS — R413 Other amnesia: Secondary | ICD-10-CM

## 2017-01-17 DIAGNOSIS — M5431 Sciatica, right side: Secondary | ICD-10-CM | POA: Diagnosis not present

## 2017-01-17 DIAGNOSIS — M5430 Sciatica, unspecified side: Secondary | ICD-10-CM | POA: Insufficient documentation

## 2017-01-17 LAB — BASIC METABOLIC PANEL
BUN: 20 mg/dL (ref 6–23)
CHLORIDE: 102 meq/L (ref 96–112)
CO2: 32 meq/L (ref 19–32)
CREATININE: 1.08 mg/dL (ref 0.40–1.50)
Calcium: 10.8 mg/dL — ABNORMAL HIGH (ref 8.4–10.5)
GFR: 69.36 mL/min (ref >60.00)
Glucose, Bld: 135 mg/dL — ABNORMAL HIGH (ref 70–99)
Potassium: 4.6 mEq/L (ref 3.5–5.1)
Sodium: 140 mEq/L (ref 135–145)

## 2017-01-17 NOTE — Assessment & Plan Note (Signed)
Now on Aricept.

## 2017-01-17 NOTE — Assessment & Plan Note (Signed)
Chronic issue. Set to undergo lumbar spine MRI through neurology.

## 2017-01-17 NOTE — Assessment & Plan Note (Signed)
Has not tolerated trazodone very well. Suspect this could be contributing to his stability issues as well. He has also seemed oversedated at times as he has ended up on the floor sleeping. We will have him decrease his dose to 50 mg daily for one week and then discontinue. Once he is off he can consider going back on the Ambien for now. He will inform his neurologist of this.

## 2017-01-17 NOTE — Progress Notes (Signed)
[name], MD Phone: [phone]  Aaron Quinn is a 83 y.o. male who presents today for follow-up.  Patient saw neurology and they started him on Aricept for his memory. They discontinued his Requip as well. They also stopped his Ambien and placed him on trazodone. He is on 50 mg with little benefit and they increased him to 100 mg. Since then he's been having vivid dreams. He has also had issues where he will find himself sleeping on the floor in the middle the night. He notes no injuries in getting there. He has noted he feels a little more unstable when walking since having these medication changes. He's also had sciatica issues on the right which he is going to have an MRI for. He's also had increase in his neuropathy symptoms in his feet though also now has symptoms in his hands with tingling bilaterally. He notes no numbness or weakness in his legs. No incontinence issues. He notes some slight increase in lightheadedness particularly when getting up though also feels off during the day. All of these things worsen since his medications were changed by neurology. Patient notes his BP is relatively well controlled at home range between 120s-140s/60s-70s. Patient also wonders whether or not he should stay on aspirin. He is now on it 3 days a week due to bruising and easy bleeding when he cuts himself. He notes that has improved some though still some minor issues with bleeding.  PMH: Former smoker   ROS see history of present illness  Objective  Physical Exam Vitals:   01/17/17 0909  BP: (!) 158/80  Pulse: (!) 58  Temp: 98.5 F (36.9 C)  SpO2: 98%   Laying blood pressure 160/84 pulse 60 Sitting blood pressure 162/90 pulse 56 Standing blood pressure 154/76 pulse 65  BP Readings from Last 3 Encounters:  01/17/17 (!) 158/80  01/15/17 (!) 145/71  12/20/16 128/70   Wt Readings from Last 3 Encounters:  01/17/17 181 lb 9.6 oz (82.4 kg)  01/15/17 177 lb 9.6 oz (80.6 kg)    11/02/16 179 lb 9.6 oz (81.5 kg)    Physical Exam  Constitutional: No distress.  Cardiovascular: Normal rate, regular rhythm and normal heart sounds.   Pulmonary/Chest: Effort normal and breath sounds normal.  Neurological: He is alert.  Moves all extremities equally, 5/5 strength in bilateral quads, hamstrings, plantar and dorsiflexion, sensation to light touch intact in bilateral UE and LE, gait with a limp favoring right side appears stable though, normal finger to nose, normal rapid alternating movements  Skin: Skin is warm and dry. He is not diaphoretic.     Assessment/Plan: Please see individual problem list.  Neuropathy ([place]) Chronic issue. Taking Lyrica and gabapentin. Suspect this is contributing to his feeling of instability. Now has symptoms in his hands as well. Discussed contacting his neurologist to let them know. He'll continue Lyrica and gabapentin.  Insomnia Has not tolerated trazodone very well. Suspect this could be contributing to his stability issues as well. He has also seemed oversedated at times as he has ended up on the floor sleeping. We will have him decrease his dose to 50 mg daily for one week and then discontinue. Once he is off he can consider going back on the Ambien for now. He will inform his neurologist of this.  Lightheadedness Chronic intermittent issue. Sounds to be orthostatic though orthostatics are negative. Potentially worsened by medication changes recently. We will get him off of the trazodone and see if this improves.  Memory deficit Now on Aricept.  Sciatica Chronic issue. Set to undergo lumbar spine MRI through neurology.  Discussed potential benefit of decreased risk of stroke and heart attack with aspirin. Also discussed risk of bleeding and bruising. Advised that the risk may outweigh the benefit at this point given bleeding and bruising is minor. Patient will stay on this.  Check bmet to ensure he is not hyponatremic leading to  balance issues.  [name], MD [place]

## 2017-01-17 NOTE — Assessment & Plan Note (Signed)
Chronic intermittent issue. Sounds to be orthostatic though orthostatics are negative. Potentially worsened by medication changes recently. We will get him off of the trazodone and see if this improves.

## 2017-01-17 NOTE — Assessment & Plan Note (Signed)
Chronic issue. Taking Lyrica and gabapentin. Suspect this is contributing to his feeling of instability. Now has symptoms in his hands as well. Discussed contacting his neurologist to let them know. He'll continue Lyrica and gabapentin.

## 2017-01-17 NOTE — Patient Instructions (Signed)
Nice to see you. Please let the neurologist know what is going on with the trazodone. Please decrease your dose to 50 mg daily for one week and then discontinue. Once you are off of this you can restart the Ambien for now. Please let the neurologist know about the hand tingling. This is likely related to neuropathy. Lyrica should help with this. We will see if your symptoms improve with coming off of the trazodone.

## 2017-01-19 LAB — CULTURE, URINE COMPREHENSIVE

## 2017-01-22 ENCOUNTER — Ambulatory Visit: Payer: Medicare Other

## 2017-01-24 ENCOUNTER — Ambulatory Visit
Admission: RE | Admit: 2017-01-24 | Discharge: 2017-01-24 | Disposition: A | Discharge status: Home / Self Care | Payer: Medicare Other | Source: Ambulatory Visit | Attending: Urology | Admitting: Urology

## 2017-01-24 DIAGNOSIS — R3129 Other microscopic hematuria: Secondary | ICD-10-CM | POA: Diagnosis present

## 2017-01-24 DIAGNOSIS — N200 Calculus of kidney: Secondary | ICD-10-CM | POA: Diagnosis not present

## 2017-01-24 DIAGNOSIS — K760 Fatty (change of) liver, not elsewhere classified: Secondary | ICD-10-CM | POA: Diagnosis not present

## 2017-01-24 DIAGNOSIS — N329 Bladder disorder, unspecified: Secondary | ICD-10-CM | POA: Diagnosis not present

## 2017-01-24 DIAGNOSIS — R161 Splenomegaly, not elsewhere classified: Secondary | ICD-10-CM | POA: Insufficient documentation

## 2017-01-24 MED ORDER — IOPAMIDOL (ISOVUE-300) INJECTION 61%
125.0000 mL | Freq: Once | INTRAVENOUS | Status: AC | PRN
  Administered 2017-01-24: 125 mL via INTRAVENOUS

## 2017-01-26 ENCOUNTER — Ambulatory Visit
Admission: RE | Admit: 2017-01-26 | Discharge: 2017-01-26 | Disposition: A | Discharge status: Home / Self Care | Payer: Medicare Other | Source: Ambulatory Visit | Attending: Acute Care | Admitting: Acute Care

## 2017-01-26 DIAGNOSIS — G8929 Other chronic pain: Secondary | ICD-10-CM

## 2017-01-26 DIAGNOSIS — M5441 Lumbago with sciatica, right side: Secondary | ICD-10-CM | POA: Diagnosis not present

## 2017-01-26 DIAGNOSIS — M5126 Other intervertebral disc displacement, lumbar region: Secondary | ICD-10-CM | POA: Insufficient documentation

## 2017-01-29 ENCOUNTER — Ambulatory Visit (INDEPENDENT_AMBULATORY_CARE_PROVIDER_SITE_OTHER): Payer: Medicare Other | Admitting: Urology

## 2017-01-29 ENCOUNTER — Encounter: Payer: Self-pay | Admitting: Urology

## 2017-01-29 VITALS — BP 147/78 | HR 66 | Ht 71.0 in | Wt 174.0 lb

## 2017-01-29 DIAGNOSIS — R3129 Other microscopic hematuria: Secondary | ICD-10-CM

## 2017-01-29 LAB — URINALYSIS, COMPLETE
BILIRUBIN UA: NEGATIVE
GLUCOSE, UA: NEGATIVE
KETONES UA: NEGATIVE
Leukocytes, UA: NEGATIVE
Nitrite, UA: NEGATIVE
Protein, UA: NEGATIVE
SPEC GRAV UA: 1.025 (ref 1.005–1.030)
UUROB: 0.2 mg/dL (ref 0.2–1.0)
pH, UA: 5.5 (ref 5.0–7.5)

## 2017-01-29 LAB — MICROSCOPIC EXAMINATION: Epithelial Cells (non renal): NONE SEEN /hpf (ref 0–10)

## 2017-01-29 MED ORDER — CIPROFLOXACIN HCL 500 MG PO TABS
500.0000 mg | ORAL_TABLET | Freq: Once | ORAL | Status: AC
  Administered 2017-01-29: 500 mg via ORAL

## 2017-01-29 MED ORDER — LIDOCAINE HCL 2 % EX GEL
1.0000 "application " | Freq: Once | CUTANEOUS | Status: AC
  Administered 2017-01-29: 1 via URETHRAL

## 2017-01-29 NOTE — Progress Notes (Signed)
01/29/2017 2:50 PM   Aaron Quinn Apr 11, 1931 [id number]  Referring provider: Leone Haven, MD [address] [place], [place] [address]  Chief Complaint  Patient presents with  . Cysto    HPI: The patient was assessed for microscopic hematuria.  The CT scan was noted today and he had a mass on the right side of his bladder.  Frequency is stable and clinically not infected  Cystoscopy: After verbal consent and utilizing sterile technique the patient underwent flexible cystoscopy.  The penile and bulbar urethra were normal.  He had bilateral enlargement of the prostate with some friable blood vessels.  He had grade 1 out of 4 bladder trabeculation.  It appeared he had approximately a 2 cm papillary lesion on the right lateral wall cephalad to the right ureteral orifice.  I quickly looked throughout the bladder and did not see any other cystitis or lesions or carcinoma in situ.  He tolerated the procedure well   PMH: Past Medical History:  Diagnosis Date  . Arthritis   . Diverticulosis   . Fibromyalgia 1995  . Glaucoma   . Hyperlipidemia   . Hypertension   . Kidney stones   . Positive TB test   . Tuberculosis   . Tubulovillous adenoma of colon     Surgical History: Past Surgical History:  Procedure Laterality Date  . APPENDECTOMY  2005  . CATARACT EXTRACTION, BILATERAL  2012  . COLONOSCOPY W/ BIOPSIES    . HEMORRHOID BANDING    . TONSILLECTOMY  1940    Home Medications:  Allergies as of 01/29/2017   No Known Allergies     Medication List        Accurate as of 01/29/17  2:50 PM. Always use your most recent med list.          amLODipine 2.5 MG tablet Commonly known as:  NORVASC Take by mouth.   aspirin 81 MG tablet Take 81 mg by mouth daily.   citalopram 10 MG tablet Commonly known as:  CELEXA Take 20 mg by mouth daily.   donepezil 5 MG tablet Commonly known as:  ARICEPT Take 5 mg by mouth daily.   FLONASE 50 MCG/ACT nasal  spray Generic drug:  fluticasone Place into both nostrils daily.   gabapentin 400 MG capsule Commonly known as:  NEURONTIN Take 400 mg by mouth daily.   hydrochlorothiazide 25 MG tablet Commonly known as:  HYDRODIURIL TAKE 1 TABLET DAILY   ketoconazole 2 % shampoo Commonly known as:  NIZORAL SHAMPOO INTO SCALP TWICE PER WEEK.   loratadine 10 MG tablet Commonly known as:  CLARITIN Take 10 mg by mouth daily.   LYRICA 25 MG capsule Generic drug:  pregabalin Take 25 mg by mouth 3 (three) times daily.   meloxicam 15 MG tablet Commonly known as:  MOBIC Take 15 mg by mouth daily. with food   montelukast 10 MG tablet Commonly known as:  SINGULAIR Take 1 tablet (10 mg total) by mouth at bedtime.   traZODone 50 MG tablet Commonly known as:  DESYREL Take 50 mg by mouth daily.       Allergies: No Known Allergies  Family History: Family History  Problem Relation Age of Onset  . Colon cancer Maternal Grandmother 82       Died at 28  . Heart disease Father   . Arthritis Unknown        Parent  . Hypertension Unknown        Parent, grandparent  .  Diabetes Son   . Alcohol abuse Son   . Depression Son   . Colon polyps Neg Hx   . Kidney disease Neg Hx   . Esophageal cancer Neg Hx   . Gallbladder disease Neg Hx     Social History:  reports that he quit smoking about 34 years ago. His smoking use included cigarettes. He has a 10.00 pack-year smoking history. he has never used smokeless tobacco. He reports that he does not drink alcohol or use drugs.  ROS:                                        Physical Exam: BP (!) 147/78   Pulse 66   Ht 5\' 11"  (1.803 m)   Wt 174 lb (78.9 kg)   BMI 24.27 kg/m   Constitutional:  Alert and oriented, No acute distress.   Laboratory Data: Lab Results  Component Value Date   WBC 6.8 06/06/2016   HGB 15.4 06/06/2016   HCT 44.5 06/06/2016   MCV 90.8 06/06/2016   PLT 234.0 06/06/2016    Lab Results   Component Value Date   CREATININE 1.08 01/17/2017    No results found for: PSA  No results found for: TESTOSTERONE  Lab Results  Component Value Date   HGBA1C 6.0 11/02/2016    Urinalysis    Component Value Date/Time   COLORURINE Yellow 02/02/2013 1315   APPEARANCEUR Cloudy (A) 01/15/2017 0936   LABSPEC 1.013 02/02/2013 1315   PHURINE 6.0 02/02/2013 1315   GLUCOSEU Negative 01/15/2017 0936   GLUCOSEU Negative 02/02/2013 1315   HGBUR Negative 02/02/2013 1315   BILIRUBINUR Negative 01/15/2017 0936   BILIRUBINUR Negative 02/02/2013 1315   KETONESUR Negative 02/02/2013 1315   PROTEINUR Trace (A) 01/15/2017 0936   PROTEINUR Negative 02/02/2013 1315   UROBILINOGEN 0.2 12/20/2016 1027   NITRITE Negative 01/15/2017 0936   NITRITE Negative 02/02/2013 1315   LEUKOCYTESUR Negative 01/15/2017 0936   LEUKOCYTESUR Negative 02/02/2013 1315    Pertinent Imaging: As above  Assessment & Plan: Picture was drawn.  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  The role of cystoscopy and transurethral resection with pros cons and risk discussed.  Superficial versus deep bladder cancer in the role of pathology was discussed.  The risk of bladder perforation and sequelae discussed  1. Microscopic hematuria  - Urinalysis, Complete - ciprofloxacin (CIPRO) tablet 500 mg - lidocaine (XYLOCAINE) 2 % jelly 1 application   No Follow-up on file.  Reece Packer, MD  [facility] [address] [place], [place] [address] [phone]

## 2017-01-29 NOTE — H&P (View-Only) (Signed)
01/29/2017 2:50 PM   Aaron Quinn 1931-08-13 [id number]  Referring provider: Leone Haven, MD [address] [place], [place] [address]  Chief Complaint  Patient presents with  . Cysto    HPI: The patient was assessed for microscopic hematuria.  The CT scan was noted today and he had a mass on the right side of his bladder.  Frequency is stable and clinically not infected  Cystoscopy: After verbal consent and utilizing sterile technique the patient underwent flexible cystoscopy.  The penile and bulbar urethra were normal.  He had bilateral enlargement of the prostate with some friable blood vessels.  He had grade 1 out of 4 bladder trabeculation.  It appeared he had approximately a 2 cm papillary lesion on the right lateral wall cephalad to the right ureteral orifice.  I quickly looked throughout the bladder and did not see any other cystitis or lesions or carcinoma in situ.  He tolerated the procedure well   PMH: Past Medical History:  Diagnosis Date  . Arthritis   . Diverticulosis   . Fibromyalgia 1995  . Glaucoma   . Hyperlipidemia   . Hypertension   . Kidney stones   . Positive TB test   . Tuberculosis   . Tubulovillous adenoma of colon     Surgical History: Past Surgical History:  Procedure Laterality Date  . APPENDECTOMY  2005  . CATARACT EXTRACTION, BILATERAL  2012  . COLONOSCOPY W/ BIOPSIES    . HEMORRHOID BANDING    . TONSILLECTOMY  1940    Home Medications:  Allergies as of 01/29/2017   No Known Allergies     Medication List        Accurate as of 01/29/17  2:50 PM. Always use your most recent med list.          amLODipine 2.5 MG tablet Commonly known as:  NORVASC Take by mouth.   aspirin 81 MG tablet Take 81 mg by mouth daily.   citalopram 10 MG tablet Commonly known as:  CELEXA Take 20 mg by mouth daily.   donepezil 5 MG tablet Commonly known as:  ARICEPT Take 5 mg by mouth daily.   FLONASE 50 MCG/ACT nasal  spray Generic drug:  fluticasone Place into both nostrils daily.   gabapentin 400 MG capsule Commonly known as:  NEURONTIN Take 400 mg by mouth daily.   hydrochlorothiazide 25 MG tablet Commonly known as:  HYDRODIURIL TAKE 1 TABLET DAILY   ketoconazole 2 % shampoo Commonly known as:  NIZORAL SHAMPOO INTO SCALP TWICE PER WEEK.   loratadine 10 MG tablet Commonly known as:  CLARITIN Take 10 mg by mouth daily.   LYRICA 25 MG capsule Generic drug:  pregabalin Take 25 mg by mouth 3 (three) times daily.   meloxicam 15 MG tablet Commonly known as:  MOBIC Take 15 mg by mouth daily. with food   montelukast 10 MG tablet Commonly known as:  SINGULAIR Take 1 tablet (10 mg total) by mouth at bedtime.   traZODone 50 MG tablet Commonly known as:  DESYREL Take 50 mg by mouth daily.       Allergies: No Known Allergies  Family History: Family History  Problem Relation Age of Onset  . Colon cancer Maternal Grandmother 59       Died at 25  . Heart disease Father   . Arthritis Unknown        Parent  . Hypertension Unknown        Parent, grandparent  .  Diabetes Son   . Alcohol abuse Son   . Depression Son   . Colon polyps Neg Hx   . Kidney disease Neg Hx   . Esophageal cancer Neg Hx   . Gallbladder disease Neg Hx     Social History:  reports that he quit smoking about 34 years ago. His smoking use included cigarettes. He has a 10.00 pack-year smoking history. he has never used smokeless tobacco. He reports that he does not drink alcohol or use drugs.  ROS:                                        Physical Exam: BP (!) 147/78   Pulse 66   Ht 5\' 11"  (1.803 m)   Wt 174 lb (78.9 kg)   BMI 24.27 kg/m   Constitutional:  Alert and oriented, No acute distress.   Laboratory Data: Lab Results  Component Value Date   WBC 6.8 06/06/2016   HGB 15.4 06/06/2016   HCT 44.5 06/06/2016   MCV 90.8 06/06/2016   PLT 234.0 06/06/2016    Lab Results   Component Value Date   CREATININE 1.08 01/17/2017    No results found for: PSA  No results found for: TESTOSTERONE  Lab Results  Component Value Date   HGBA1C 6.0 11/02/2016    Urinalysis    Component Value Date/Time   COLORURINE Yellow 02/02/2013 1315   APPEARANCEUR Cloudy (A) 01/15/2017 0936   LABSPEC 1.013 02/02/2013 1315   PHURINE 6.0 02/02/2013 1315   GLUCOSEU Negative 01/15/2017 0936   GLUCOSEU Negative 02/02/2013 1315   HGBUR Negative 02/02/2013 1315   BILIRUBINUR Negative 01/15/2017 0936   BILIRUBINUR Negative 02/02/2013 1315   KETONESUR Negative 02/02/2013 1315   PROTEINUR Trace (A) 01/15/2017 0936   PROTEINUR Negative 02/02/2013 1315   UROBILINOGEN 0.2 12/20/2016 1027   NITRITE Negative 01/15/2017 0936   NITRITE Negative 02/02/2013 1315   LEUKOCYTESUR Negative 01/15/2017 0936   LEUKOCYTESUR Negative 02/02/2013 1315    Pertinent Imaging: As above  Assessment & Plan: Picture was drawn.  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  The role of cystoscopy and transurethral resection with pros cons and risk discussed.  Superficial versus deep bladder cancer in the role of pathology was discussed.  The risk of bladder perforation and sequelae discussed  1. Microscopic hematuria  - Urinalysis, Complete - ciprofloxacin (CIPRO) tablet 500 mg - lidocaine (XYLOCAINE) 2 % jelly 1 application   No Follow-up on file.  Reece Packer, MD  [facility] [address] [place], [place] [address] [phone]

## 2017-01-30 NOTE — Telephone Encounter (Signed)
Please advise 

## 2017-01-30 NOTE — Telephone Encounter (Signed)
Please review

## 2017-01-30 NOTE — Telephone Encounter (Signed)
In reviewing patient;'s chart, I am concerned regarding  His insomnia.  Appears neurology took him off of ambien  Then he had vivid dreams, sleeping on floor on trazodone.    Is he COMPLETELY off the trazodone now?  How ofetn does he use the [place]? Does he feel very sedated on this medication?   Any recents falls?  I am still not sure if comfortable refilling however lets start with getting more information   I looked up patient on [place] Controlled Substances Reporting System and saw no activity that raised concern of inappropriate use.  [place] refilled with 90 09/2016

## 2017-01-30 NOTE — Telephone Encounter (Signed)
Copied from [place] [id number]. Topic: Quick Communication - See Telephone Encounter >> Jan 30, 2017  2:49 PM Vernona Rieger wrote: CRM for notification. See Telephone encounter for:  Patient says he is out of his Aaron Quinn and he needs a 90 days supply sent over to [facility].  01/30/17.

## 2017-01-31 LAB — CULTURE, URINE COMPREHENSIVE

## 2017-01-31 MED ORDER — ZOLPIDEM TARTRATE 10 MG PO TABS: 10.0000 mg | ORAL_TABLET | Freq: Every evening | ORAL | 0 refills | Status: DC | PRN

## 2017-01-31 NOTE — Telephone Encounter (Signed)
[facility]. Patient aware

## 2017-01-31 NOTE — Telephone Encounter (Signed)
Attempted to call patient, phone was busy.

## 2017-01-31 NOTE — Telephone Encounter (Signed)
Patient has been taking Ambien for 23-24 years. He never had any trouble. He is completely off of the trazodone. The vivid dreams and falls have stopped. No falls recently. Patient says he uses the Ambien every night and he does not feel sedated.

## 2017-01-31 NOTE — Telephone Encounter (Signed)
appreciate your clarifying  I will send in one month supply as covering for pcp. He may call in a week or so with PCP has returned for 90 days  Please let him know

## 2017-02-01 ENCOUNTER — Other Ambulatory Visit: Payer: Self-pay | Admitting: Radiology

## 2017-02-01 DIAGNOSIS — N329 Bladder disorder, unspecified: Secondary | ICD-10-CM

## 2017-02-07 ENCOUNTER — Telehealth: Payer: Self-pay | Admitting: Family Medicine

## 2017-02-07 ENCOUNTER — Other Ambulatory Visit: Payer: Self-pay

## 2017-02-07 ENCOUNTER — Encounter
Admission: RE | Admit: 2017-02-07 | Discharge: 2017-02-07 | Disposition: A | Discharge status: Home / Self Care | Payer: Medicare Other | Source: Ambulatory Visit | Attending: Urology | Admitting: Urology

## 2017-02-07 DIAGNOSIS — Z01818 Encounter for other preprocedural examination: Secondary | ICD-10-CM | POA: Diagnosis present

## 2017-02-07 DIAGNOSIS — I10 Essential (primary) hypertension: Secondary | ICD-10-CM | POA: Diagnosis not present

## 2017-02-07 DIAGNOSIS — R3129 Other microscopic hematuria: Secondary | ICD-10-CM | POA: Insufficient documentation

## 2017-02-07 HISTORY — DX: Other complications of anesthesia, initial encounter: T88.59XA

## 2017-02-07 HISTORY — DX: Personal history of urinary calculi: Z87.442

## 2017-02-07 HISTORY — DX: Adverse effect of unspecified anesthetic, initial encounter: T41.45XA

## 2017-02-07 NOTE — Telephone Encounter (Unsigned)
Copied from [place]. Topic: General - Other >> Feb 07, 2017  9:35 AM Neva Seat wrote:  Dr. Caryl Bis needs to approve: Zolpidemtartae 10mg  14 day supply [facility] mails it to me.

## 2017-02-07 NOTE — Patient Instructions (Addendum)
  Your procedure is scheduled [id number] Nov. 27 , 2018. Report to Same Day Surgery. To find out your arrival time please call [phone] between 1PM - 3PM on Monday Nov. 26, 2018 .  Remember: Instructions that are not followed completely may result in serious medical risk, up to and including death, or upon the discretion of your surgeon and anesthesiologist your surgery may need to be rescheduled.    _x___ 1. Do not eat food after midnight night prior to surgery. No gum   chewing or hard candies, snacks or breakfast.    August 22, 2022 drink the following: water, Gatorade, clear apple juice, black coffee     or black tea up until 2 hours prior to ARRIVAL time.     ____ 2. No Alcohol for 24 hours before or after surgery.   ____ 3. Bring all medications with you on the day of surgery if instructed.    __x__ 4. Notify your doctor if there is any change in your medical condition     (cold, fever, infections).    _____ 5.   Do Not Smoke or use e-cigarettes For 24 Hours Prior to Your   Surgery.  Do not use any chewable tobacco products for at least 6   hours prior to  surgery.                      Do not wear jewelry, make-up, hairpins, clips or nail polish.  Do not wear lotions, powders, or perfumes.   Do not shave 48 hours prior to surgery. Men may shave face and neck.  Do not bring valuables to the hospital.    [facility] is not responsible for any belongings or valuables.               Contacts, dentures or bridgework may not be worn into surgery.  Leave your suitcase in the car. After surgery it may be brought to your room.  For patients admitted to the hospital, discharge time is determined by your  treatment team.   Patients discharged the day of surgery will not be allowed to drive home.    Please read over the following fact sheets that you were given:   [facility] Preparing for Surgery  __x__ Take these medicines the morning of surgery with A SIP OF WATER:    1. amLODipine  (NORVASC)  2. citalopram (CELEXA)   3. donepezil (ARICEPT)  4. gabapentin (NEURONTIN)  5. LYRICA   6. Prednisone     ____ Fleet Enema (as directed)   ____ Use CHG Soap as directed on instruction sheet  ____ Use inhalers on the day of surgery and bring to hospital day of surgery  ____ Stop metformin 2 days prior to surgery    ____ Take 1/2 of usual insulin dose the night before surgery and none on the morning of          surgery.   __x__ Aspirin has already been stopped for surgery.  __x__ Stop Anti-inflammatories such as Advil, Aleve, Ibuprofen, meloxicam (MOBIC),  Motrin, Naproxen, Naprosyn, Goodies powders or aspirin products. OK to take Tylenol.   ____ Stop supplements until after surgery.    ____ Bring C-Pap to the hospital.

## 2017-02-07 NOTE — Telephone Encounter (Signed)
Ambien refill. Last office visit 01/17/17

## 2017-02-07 NOTE — Pre-Procedure Instructions (Signed)
DISCUSSED EKG WITH DR A PENWARDEN. 2012 NOTED HR 54

## 2017-02-09 LAB — URINE CULTURE: Culture: NO GROWTH

## 2017-02-12 ENCOUNTER — Other Ambulatory Visit: Payer: Medicare Other

## 2017-02-12 NOTE — Telephone Encounter (Signed)
Has been faxed.

## 2017-02-12 NOTE — Telephone Encounter (Signed)
I think this prescription has been faxed. Please advise

## 2017-02-13 ENCOUNTER — Telehealth: Payer: Self-pay | Admitting: Urology

## 2017-02-13 ENCOUNTER — Ambulatory Visit: Payer: Medicare Other | Admitting: Anesthesiology

## 2017-02-13 ENCOUNTER — Ambulatory Visit
Admission: RE | Admit: 2017-02-13 | Discharge: 2017-02-13 | Disposition: A | Discharge status: Home / Self Care | Payer: Medicare Other | Source: Ambulatory Visit | Attending: Urology | Admitting: Urology

## 2017-02-13 ENCOUNTER — Encounter
Admission: RE | Disposition: A | Discharge status: Home / Self Care | Payer: Self-pay | Source: Ambulatory Visit | Attending: Urology

## 2017-02-13 DIAGNOSIS — N329 Bladder disorder, unspecified: Secondary | ICD-10-CM

## 2017-02-13 DIAGNOSIS — E785 Hyperlipidemia, unspecified: Secondary | ICD-10-CM | POA: Diagnosis not present

## 2017-02-13 DIAGNOSIS — M1990 Unspecified osteoarthritis, unspecified site: Secondary | ICD-10-CM | POA: Diagnosis not present

## 2017-02-13 DIAGNOSIS — M797 Fibromyalgia: Secondary | ICD-10-CM | POA: Diagnosis not present

## 2017-02-13 DIAGNOSIS — C675 Malignant neoplasm of bladder neck: Secondary | ICD-10-CM | POA: Insufficient documentation

## 2017-02-13 DIAGNOSIS — Z87891 Personal history of nicotine dependence: Secondary | ICD-10-CM | POA: Diagnosis not present

## 2017-02-13 DIAGNOSIS — Z79899 Other long term (current) drug therapy: Secondary | ICD-10-CM | POA: Insufficient documentation

## 2017-02-13 DIAGNOSIS — N400 Enlarged prostate without lower urinary tract symptoms: Secondary | ICD-10-CM | POA: Diagnosis not present

## 2017-02-13 DIAGNOSIS — C670 Malignant neoplasm of trigone of bladder: Secondary | ICD-10-CM | POA: Insufficient documentation

## 2017-02-13 DIAGNOSIS — I10 Essential (primary) hypertension: Secondary | ICD-10-CM | POA: Diagnosis not present

## 2017-02-13 DIAGNOSIS — D494 Neoplasm of unspecified behavior of bladder: Secondary | ICD-10-CM | POA: Diagnosis not present

## 2017-02-13 DIAGNOSIS — Z7982 Long term (current) use of aspirin: Secondary | ICD-10-CM | POA: Diagnosis not present

## 2017-02-13 DIAGNOSIS — Z86010 Personal history of colonic polyps: Secondary | ICD-10-CM | POA: Diagnosis not present

## 2017-02-13 DIAGNOSIS — C672 Malignant neoplasm of lateral wall of bladder: Secondary | ICD-10-CM | POA: Diagnosis not present

## 2017-02-13 DIAGNOSIS — Z87442 Personal history of urinary calculi: Secondary | ICD-10-CM | POA: Diagnosis not present

## 2017-02-13 DIAGNOSIS — Z8719 Personal history of other diseases of the digestive system: Secondary | ICD-10-CM | POA: Insufficient documentation

## 2017-02-13 DIAGNOSIS — R310 Gross hematuria: Secondary | ICD-10-CM | POA: Diagnosis not present

## 2017-02-13 HISTORY — PX: TRANSURETHRAL RESECTION OF BLADDER TUMOR: SHX2575

## 2017-02-13 SURGERY — TURBT (TRANSURETHRAL RESECTION OF BLADDER TUMOR)
Anesthesia: General | Site: Bladder | Wound class: Clean Contaminated

## 2017-02-13 MED ORDER — LACTATED RINGERS IV SOLN
INTRAVENOUS | Status: DC
  Administered 2017-02-13 (×2): via INTRAVENOUS

## 2017-02-13 MED ORDER — DEXAMETHASONE SODIUM PHOSPHATE 10 MG/ML IJ SOLN
INTRAMUSCULAR | Status: AC
  Filled 2017-02-13: qty 1

## 2017-02-13 MED ORDER — FENTANYL CITRATE (PF) 100 MCG/2ML IJ SOLN
INTRAMUSCULAR | Status: AC
  Filled 2017-02-13: qty 2

## 2017-02-13 MED ORDER — FENTANYL CITRATE (PF) 100 MCG/2ML IJ SOLN: 25.0000 ug | INTRAMUSCULAR | Status: DC | PRN

## 2017-02-13 MED ORDER — CIPROFLOXACIN IN D5W 400 MG/200ML IV SOLN
400.0000 mg | INTRAVENOUS | Status: AC
  Administered 2017-02-13: 400 mg via INTRAVENOUS

## 2017-02-13 MED ORDER — DEXAMETHASONE SODIUM PHOSPHATE 10 MG/ML IJ SOLN
INTRAMUSCULAR | Status: DC | PRN
  Administered 2017-02-13: 10 mg via INTRAVENOUS

## 2017-02-13 MED ORDER — LIDOCAINE HCL (CARDIAC) 20 MG/ML IV SOLN
INTRAVENOUS | Status: DC | PRN
  Administered 2017-02-13: 100 mg via INTRAVENOUS

## 2017-02-13 MED ORDER — ONDANSETRON HCL 4 MG/2ML IJ SOLN
INTRAMUSCULAR | Status: DC | PRN
  Administered 2017-02-13: 4 mg via INTRAVENOUS

## 2017-02-13 MED ORDER — LIDOCAINE HCL (PF) 2 % IJ SOLN
INTRAMUSCULAR | Status: AC
  Filled 2017-02-13: qty 10

## 2017-02-13 MED ORDER — OXYBUTYNIN CHLORIDE 5 MG PO TABS: ORAL_TABLET | ORAL | 0 refills | Status: DC

## 2017-02-13 MED ORDER — CIPROFLOXACIN IN D5W 400 MG/200ML IV SOLN
INTRAVENOUS | Status: AC
  Administered 2017-02-13: 400 mg via INTRAVENOUS
  Filled 2017-02-13: qty 200

## 2017-02-13 MED ORDER — PROPOFOL 10 MG/ML IV BOLUS
INTRAVENOUS | Status: DC | PRN
  Administered 2017-02-13: 10 mg via INTRAVENOUS
  Administered 2017-02-13: 120 mg via INTRAVENOUS

## 2017-02-13 MED ORDER — FAMOTIDINE 20 MG PO TABS
20.0000 mg | ORAL_TABLET | Freq: Once | ORAL | Status: AC
  Administered 2017-02-13: 20 mg via ORAL

## 2017-02-13 MED ORDER — FENTANYL CITRATE (PF) 100 MCG/2ML IJ SOLN
INTRAMUSCULAR | Status: DC | PRN
  Administered 2017-02-13 (×3): 25 ug via INTRAVENOUS

## 2017-02-13 MED ORDER — TRAMADOL HCL 50 MG PO TABS: 50.0000 mg | ORAL_TABLET | Freq: Four times a day (QID) | ORAL | 0 refills | Status: DC | PRN

## 2017-02-13 MED ORDER — ONDANSETRON HCL 4 MG/2ML IJ SOLN
INTRAMUSCULAR | Status: AC
  Filled 2017-02-13: qty 2

## 2017-02-13 MED ORDER — ONDANSETRON HCL 4 MG/2ML IJ SOLN: 4.0000 mg | Freq: Once | INTRAMUSCULAR | Status: DC | PRN

## 2017-02-13 MED ORDER — EPHEDRINE SULFATE 50 MG/ML IJ SOLN
INTRAMUSCULAR | Status: AC
  Filled 2017-02-13: qty 1

## 2017-02-13 MED ORDER — OXYBUTYNIN CHLORIDE 5 MG PO TABS
5.0000 mg | ORAL_TABLET | Freq: Once | ORAL | Status: AC
  Administered 2017-02-13: 5 mg via ORAL
  Filled 2017-02-13: qty 1

## 2017-02-13 MED ORDER — GLYCOPYRROLATE 0.2 MG/ML IJ SOLN
INTRAMUSCULAR | Status: AC
  Filled 2017-02-13: qty 1

## 2017-02-13 MED ORDER — FAMOTIDINE 20 MG PO TABS
ORAL_TABLET | ORAL | Status: AC
  Administered 2017-02-13: 20 mg via ORAL
  Filled 2017-02-13: qty 1

## 2017-02-13 MED ORDER — EPHEDRINE SULFATE 50 MG/ML IJ SOLN
INTRAMUSCULAR | Status: DC | PRN
  Administered 2017-02-13 (×2): 10 mg via INTRAVENOUS
  Administered 2017-02-13: 15 mg via INTRAVENOUS

## 2017-02-13 MED ORDER — GLYCOPYRROLATE 0.2 MG/ML IJ SOLN
INTRAMUSCULAR | Status: DC | PRN
  Administered 2017-02-13: 0.2 mg via INTRAVENOUS

## 2017-02-13 MED ORDER — PROPOFOL 10 MG/ML IV BOLUS
INTRAVENOUS | Status: AC
  Filled 2017-02-13: qty 20

## 2017-02-13 MED ORDER — OXYBUTYNIN CHLORIDE 5 MG PO TABS
ORAL_TABLET | ORAL | Status: AC
  Filled 2017-02-13: qty 1

## 2017-02-13 SURGICAL SUPPLY — 28 items
BAG DRAIN CYSTO-URO LG1000N (MISCELLANEOUS) ×3 IMPLANT
BAG URINE DRAINAGE (UROLOGICAL SUPPLIES) ×3 IMPLANT
BRUSH SCRUB EZ  4% CHG (MISCELLANEOUS)
BRUSH SCRUB EZ 4% CHG (MISCELLANEOUS) ×1 IMPLANT
CATH FOL 2WAY LX 18X30 (CATHETERS) ×2 IMPLANT
CATH FOLEY 2WAY  5CC 16FR (CATHETERS)
CATH FOLEY 2WAY 5CC 16FR (CATHETERS)
CATH URTH 16FR FL 2W BLN LF (CATHETERS) ×1 IMPLANT
DRAPE UTILITY 15X26 TOWEL STRL (DRAPES) ×3 IMPLANT
DRSG TELFA 4X3 1S NADH ST (GAUZE/BANDAGES/DRESSINGS) ×3 IMPLANT
ELECT LOOP 22F BIPOLAR SML (ELECTROSURGICAL) ×3
ELECT REM PT RETURN 9FT ADLT (ELECTROSURGICAL)
ELECTRODE LOOP 22F BIPOLAR SML (ELECTROSURGICAL) ×1 IMPLANT
ELECTRODE REM PT RTRN 9FT ADLT (ELECTROSURGICAL) IMPLANT
GLOVE BIO SURGEON STRL SZ 6.5 (GLOVE) ×2 IMPLANT
GLOVE BIO SURGEONS STRL SZ 6.5 (GLOVE) ×1
GOWN STRL REUS W/ TWL LRG LVL3 (GOWN DISPOSABLE) ×2 IMPLANT
GOWN STRL REUS W/TWL LRG LVL3 (GOWN DISPOSABLE) ×6
KIT RM TURNOVER CYSTO AR (KITS) ×3 IMPLANT
LOOP CUT BIPOLAR 24F LRG (ELECTROSURGICAL) IMPLANT
NDL SAFETY ECLIPSE 18X1.5 (NEEDLE) ×1 IMPLANT
NEEDLE HYPO 18GX1.5 SHARP (NEEDLE)
PACK CYSTO AR (MISCELLANEOUS) ×3 IMPLANT
SET IRRIG Y TYPE TUR BLADDER L (SET/KITS/TRAYS/PACK) ×3 IMPLANT
SET IRRIGATING DISP (SET/KITS/TRAYS/PACK) ×3 IMPLANT
SURGILUBE 2OZ TUBE FLIPTOP (MISCELLANEOUS) ×3 IMPLANT
SYRINGE IRR TOOMEY STRL 70CC (SYRINGE) ×5 IMPLANT
WATER STERILE IRR 1000ML POUR (IV SOLUTION) ×3 IMPLANT

## 2017-02-13 NOTE — Telephone Encounter (Signed)
-----   Message from Abbie Sons, MD sent at 02/13/2017 12:21 PM EST ----- Regarding: RE: post op He will need a postop visit with me for pathology results next week. ----- Message ----- From: Benard Halsted Sent: 02/13/2017  12:07 PM To: Abbie Sons, MD Subject: post op                                        He is coming in on the 30th to see the nurse for his V&T When would you like to see him for post op? He was scheduled for this with you on the 5th but I cx it.  Sharyn Lull

## 2017-02-13 NOTE — Anesthesia Preprocedure Evaluation (Signed)
Anesthesia Evaluation  Patient identified by MRN, date of birth, ID band Patient awake    Reviewed: Allergy & Precautions, NPO status , Patient's Chart, lab work & pertinent test results  History of Anesthesia Complications (+) PROLONGED EMERGENCE and history of anesthetic complications  Airway Mallampati: III  TM Distance: >3 FB     Dental   Pulmonary former smoker,    Pulmonary exam normal        Cardiovascular hypertension, Pt. on medications Normal cardiovascular exam     Neuro/Psych  Neuromuscular disease negative psych ROS   GI/Hepatic negative GI ROS, Neg liver ROS,   Endo/Other  negative endocrine ROS  Renal/GU negative Renal ROS     Musculoskeletal  (+) Arthritis , Osteoarthritis,  Fibromyalgia -  Abdominal Normal abdominal exam  (+)   Peds negative pediatric ROS (+)  Hematology negative hematology ROS (+)   Anesthesia Other Findings   Reproductive/Obstetrics                             Anesthesia Physical Anesthesia Plan  ASA: II  Anesthesia Plan: General   Post-op Pain Management:    Induction: Intravenous  PONV Risk Score and Plan:   Airway Management Planned: Nasal Cannula  Additional Equipment:   Intra-op Plan:   Post-operative Plan:   Informed Consent: I have reviewed the patients History and Physical, chart, labs and discussed the procedure including the risks, benefits and alternatives for the proposed anesthesia with the patient or authorized representative who has indicated his/her understanding and acceptance.   Dental advisory given  Plan Discussed with: CRNA and Surgeon  Anesthesia Plan Comments:         Anesthesia Quick Evaluation

## 2017-02-13 NOTE — Discharge Instructions (Addendum)
Indwelling Urinary Catheter Care, Adult Take good care of your catheter to keep it working and to prevent problems. How to wear your catheter Attach your catheter to your leg with tape (adhesive tape) or a leg strap. Make sure it is not too tight. If you use tape, remove any bits of tape that are already on the catheter. How to wear a drainage bag You should have:  A large overnight bag.  A small leg bag.  Overnight Bag You may wear the overnight bag at any time. Always keep the bag below the level of your bladder but off the floor. When you sleep, put a clean plastic bag in a wastebasket. Then hang the bag inside the wastebasket. Leg Bag Never wear the leg bag at night. Always wear the leg bag below your knee. Keep the leg bag secure with a leg strap or tape. How to care for your skin  Clean the skin around the catheter at least once every day.  Shower every day. Do not take baths.  Put creams, lotions, or ointments on your genital area only as told by your doctor.  Do not use powders, sprays, or lotions on your genital area. How to clean your catheter and your skin 1. Wash your hands with soap and water. 2. Wet a washcloth in warm water and gentle (mild) soap. 3. Use the washcloth to clean the skin where the catheter enters your body. Clean downward and wipe away from the catheter in small circles. Do not wipe toward the catheter. 4. Pat the area dry with a clean towel. Make sure to clean off all soap. How to care for your drainage bags Empty your drainage bag when it is ?- full or at least 2-3 times a day. Replace your drainage bag once a month or sooner if it starts to smell bad or look dirty. Do not clean your drainage bag unless told by your doctor. Emptying a drainage bag  Supplies Needed  Rubbing alcohol.  Gauze pad or cotton ball.  Tape or a leg strap.  Steps 1. Wash your hands with soap and water. 2. Separate (detach) the bag from your leg. 3. Hold the bag over  the toilet or a clean container. Keep the bag below your hips and bladder. This stops pee (urine) from going back into the tube. 4. Open the pour spout at the bottom of the bag. 5. Empty the pee into the toilet or container. Do not let the pour spout touch any surface. 6. Put rubbing alcohol on a gauze pad or cotton ball. 7. Use the gauze pad or cotton ball to clean the pour spout. 8. Close the pour spout. 9. Attach the bag to your leg with tape or a leg strap. 10. Wash your hands.  Changing a drainage bag Supplies Needed  Alcohol wipes.  A clean drainage bag.  Adhesive tape or a leg strap.  Steps 1. Wash your hands with soap and water. 2. Separate the dirty bag from your leg. 3. Pinch the rubber catheter with your fingers so that pee does not spill out. 4. Separate the catheter tube from the drainage tube where these tubes connect (at the connection valve). Do not let the tubes touch any surface. 5. Clean the end of the catheter tube with an alcohol wipe. Use a different alcohol wipe to clean the end of the drainage tube. 6. Connect the catheter tube to the drainage tube of the clean bag. 7. Attach the new bag to  the leg with adhesive tape or a leg strap. °8. Wash your hands. ° °How to prevent infection and other problems °· Never pull on your catheter or try to remove it. Pulling can damage tissue in your body. °· Always wash your hands before and after touching your catheter. °· If a leg strap gets wet, replace it with a dry one. °· Drink enough fluids to keep your pee clear or pale yellow, or as told by your doctor. °· Do not let the drainage bag or tubing touch the floor. °· Wear cotton underwear. °· If you are male, wipe from front to back after you poop (have a bowel movement). °· Check on the catheter often to make sure it works and the tubing is not twisted. °Get help if: °· Your pee is cloudy. °· Your pee smells unusually bad. °· Your pee is not draining into the bag. °· Your  tube gets clogged. °· Your catheter starts to leak. °· Your bladder feels full. °Get help right away if: °· You have redness, swelling, or pain where the catheter enters your body. °· You have fluid, pus, or a bad smell coming from the area where the catheter enters your body. °· The area where the catheter enters your body feels warm. °· You have a fever. °· You have pain in your: °? Stomach (abdomen). °? Legs. °? Lower back. °? Bladder. °· You see blood fill the catheter. °· Your pee is pink or red. °· You feel sick to your stomach (nauseous). °· You throw up (vomit). °· You have chills. °· Your catheter gets pulled out. °This information is not intended to replace advice given to you by your health care provider. Make sure you discuss any questions you have with your health care provider. °Document Released: 07/01/2012 Document Revised: 02/02/2016 Document Reviewed: 08/19/2013 °Elsevier Interactive Patient Education © 2018 [employer] ° ° °AMBULATORY SURGERY  °DISCHARGE INSTRUCTIONS ° ° °1) The drugs that you were given will stay in your system until tomorrow so for the next 24 hours you should not: ° °A) Drive an automobile °B) Make any legal decisions °C) Drink any alcoholic beverage ° ° °2) You may resume regular meals tomorrow.  Today it is better to start with liquids and gradually work up to solid foods. ° °You may eat anything you prefer, but it is better to start with liquids, then soup and crackers, and gradually work up to solid foods. ° ° °3) Please notify your doctor immediately if you have any unusual bleeding, trouble breathing, redness and pain at the surgery site, drainage, fever, or pain not relieved by medication. ° ° ° °4) Additional Instructions: ° ° ° ° ° ° ° °Please contact your physician with any problems or Same Day Surgery at [phone], Monday through Friday 6 am to 4 pm, or  at [place] Main number at [phone]. ° °

## 2017-02-13 NOTE — Anesthesia Post-op Follow-up Note (Signed)
Anesthesia QCDR form completed.        

## 2017-02-13 NOTE — Anesthesia Procedure Notes (Signed)
Procedure Name: LMA Insertion Date/Time: 02/13/2017 9:50 AM Performed by: Silvana Newness, CRNA Pre-anesthesia Checklist: Patient identified, Emergency Drugs available, Suction available, Patient being monitored and Timeout performed Patient Re-evaluated:Patient Re-evaluated prior to induction Oxygen Delivery Method: Circle system utilized Preoxygenation: Pre-oxygenation with 100% oxygen Induction Type: IV induction Ventilation: Mask ventilation without difficulty LMA: LMA inserted LMA Size: 5.0 Number of attempts: 1 Placement Confirmation: positive ETCO2 and breath sounds checked- equal and bilateral Tube secured with: Tape Dental Injury: Teeth and Oropharynx as per pre-operative assessment

## 2017-02-13 NOTE — Anesthesia Postprocedure Evaluation (Signed)
Anesthesia Post Note  Patient: Aaron Quinn  Procedure(s) Performed: TRANSURETHRAL RESECTION OF BLADDER TUMOR (TURBT) (N/A Bladder)  Patient location during evaluation: PACU Anesthesia Type: General Level of consciousness: awake and alert and oriented Pain management: pain level controlled Vital Signs Assessment: post-procedure vital signs reviewed and stable Respiratory status: spontaneous breathing Cardiovascular status: blood pressure returned to baseline Anesthetic complications: no     Last Vitals:  Vitals:   02/13/17 1100 02/13/17 1109  BP: (!) 175/91 (!) 142/90  Pulse: 78 80  Resp: 14 17  Temp: (!) 36.3 C   SpO2: 100% 100%    Last Pain:  Vitals:   02/13/17 0921  TempSrc: Tympanic  PainSc: 2                  Neill Jurewicz

## 2017-02-13 NOTE — Telephone Encounter (Signed)
done

## 2017-02-13 NOTE — Interval H&P Note (Signed)
History and Physical Interval Note:  02/13/2017 9:19 AM  Aaron Quinn  has presented today for surgery, with the diagnosis of Bladder lesion  The various methods of treatment have been discussed with the patient and family. After consideration of risks, benefits and other options for treatment, the patient has consented to  Procedure(s) with comments: TRANSURETHRAL RESECTION OF BLADDER TUMOR (TURBT) (N/A) - 45 minutes needed as a surgical intervention .  The patient's history has been reviewed, patient examined, no change in status, stable for surgery.  I have reviewed the patient's chart and labs.  Questions were answered to the patient's satisfaction.    [place]

## 2017-02-13 NOTE — Transfer of Care (Signed)
Immediate Anesthesia Transfer of Care Note  Patient: Aaron Quinn  Procedure(s) Performed: TRANSURETHRAL RESECTION OF BLADDER TUMOR (TURBT) (N/A Bladder)  Patient Location: PACU  Anesthesia Type:General  Level of Consciousness: drowsy and patient cooperative  Airway & Oxygen Therapy: Patient Spontanous Breathing and Patient connected to face mask oxygen  Post-op Assessment: Report given to RN and Post -op Vital signs reviewed and stable  Post vital signs: Reviewed and stable  Last Vitals:  Vitals:   02/13/17 0921 02/13/17 1100  BP: (!) 150/90   Pulse: 62   Resp: 17   Temp: (!) 35.6 C (!) (P) 36.3 C  SpO2: 95%     Last Pain:  Vitals:   02/13/17 0921  TempSrc: Tympanic  PainSc: 2       Patients Stated Pain Goal: 2 (27/67/01 1003)  Complications: No apparent anesthesia complications

## 2017-02-14 ENCOUNTER — Encounter: Payer: Self-pay | Admitting: Urology

## 2017-02-16 ENCOUNTER — Ambulatory Visit (INDEPENDENT_AMBULATORY_CARE_PROVIDER_SITE_OTHER): Payer: Medicare Other

## 2017-02-16 VITALS — BP 158/77 | HR 78 | Ht 71.0 in | Wt 164.0 lb

## 2017-02-16 DIAGNOSIS — R3129 Other microscopic hematuria: Secondary | ICD-10-CM | POA: Diagnosis not present

## 2017-02-16 LAB — SURGICAL PATHOLOGY

## 2017-02-16 NOTE — Progress Notes (Signed)
Catheter Removal  Patient is present today for a catheter removal.  72ml of water was drained from the balloon. A 20FR foley cath was removed from the bladder no complications were noted . Patient tolerated well.  Preformed by: Toniann Fail, LPN   Follow up/ Additional notes: pt will f/u next week with Dr. Bernardo Heater for pathology results.   Blood pressure (!) 158/77, pulse 78, height 5\' 11"  (1.803 m), weight 164 lb (74.4 kg).

## 2017-02-16 NOTE — Op Note (Signed)
Preoperative diagnosis: 1. Bladder tumor (>5cm)  Postoperative diagnosis:  1. 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 Transurethral resection of bladder tumor (large)  Surgeon: Nicki Reaper C. Stoioff, M.D.  Anesthesia: General  Complications: None  Intraoperative findings:  1. Bladder tumor: ~ 3cm papillary tumor right lateral wall; ~15 mm papillary tumor left anterolateral wall near bladder neck; papillary appearing tissue trigone and near the right ureteral orifice encompassing ~2cm   EBL: Minimal  Specimens: 1. Bladder tumor right lateral wall 2. Bladder tumor trigone 3. Bladder tumor left bladder neck region   Indication: Aaron Quinn is a patient who was found to have a bladder tumor on recent cystoscopy performed for gross hematuria. After reviewing the management options for treatment, he elected to proceed with the above surgical procedure(s). We have discussed the potential benefits and risks of the procedure, side effects of the proposed treatment, the likelihood of the patient achieving the goals of the procedure, and any potential problems that might occur during the procedure or recuperation. Informed consent has been obtained.  Description of procedure:  The patient was taken to the operating room and general anesthesia was induced.  The patient was placed in the dorsal lithotomy position, prepped and draped in the usual sterile fashion, and preoperative antibiotics were administered. A preoperative time-out was performed.   A 26 French continuous-flow resectoscope sheath with obturator was lubricated and passed under direct vision.  The obturator was removed and replaced with an Pioneer.  The patient's urethra was examined and was normal/ demonstrated bilobar prostatic hypertrophy   The bladder was then systematically examined in its entirety.  On the right lateral wall was an approximately 3 cm papillary tumor.  There was papillary appearing tissue just  lateral to the right ureteral orifice and abnormal appearing papillary tissue encompassing the trigone with an estimated volume of 2 cm.  On the left anterolateral bladder wall near the bladder neck at the 2 o'clock position was a 15 mm papillary tumor.  Using loop cautery resection, the entire tumors were resected and removed for permanent pathologic analysis.   Hemostasis was then achieved with the loop cautery and the bladder was emptied and reinspected with no further bleeding noted at the end of the procedure.  The resected tissue near the right ureteral orifice was not bleeding and not cauterized.  The bladder was then emptied and the procedure ended.  The patient appeared to tolerate the procedure well and without complications.  After anesthetic reversal he was transferred to the PACU in satisfactory condition.    Scott C. Bernardo Heater,  MD

## 2017-02-17 ENCOUNTER — Emergency Department
Admission: EM | Admit: 2017-02-17 | Discharge: 2017-02-17 | Disposition: A | Discharge status: Home / Self Care | Payer: Medicare Other | Attending: Emergency Medicine | Admitting: Emergency Medicine

## 2017-02-17 ENCOUNTER — Other Ambulatory Visit: Payer: Self-pay

## 2017-02-17 ENCOUNTER — Encounter: Payer: Self-pay | Admitting: Emergency Medicine

## 2017-02-17 DIAGNOSIS — I10 Essential (primary) hypertension: Secondary | ICD-10-CM | POA: Diagnosis not present

## 2017-02-17 DIAGNOSIS — R339 Retention of urine, unspecified: Secondary | ICD-10-CM | POA: Insufficient documentation

## 2017-02-17 DIAGNOSIS — Z79899 Other long term (current) drug therapy: Secondary | ICD-10-CM | POA: Insufficient documentation

## 2017-02-17 DIAGNOSIS — Z87891 Personal history of nicotine dependence: Secondary | ICD-10-CM | POA: Diagnosis not present

## 2017-02-17 LAB — BASIC METABOLIC PANEL
ANION GAP: 11 (ref 5–15)
BUN: 33 mg/dL — ABNORMAL HIGH (ref 6–20)
CHLORIDE: 94 mmol/L — AB (ref 101–111)
CO2: 27 mmol/L (ref 22–32)
Calcium: 10 mg/dL (ref 8.9–10.3)
Creatinine, Ser: 1.4 mg/dL — ABNORMAL HIGH (ref 0.61–1.24)
GFR calc Af Amer: 52 mL/min — ABNORMAL LOW (ref >60)
GFR, EST NON AFRICAN AMERICAN: 45 mL/min — AB (ref >60)
GLUCOSE: 172 mg/dL — AB (ref 65–99)
POTASSIUM: 5.1 mmol/L (ref 3.5–5.1)
Sodium: 132 mmol/L — ABNORMAL LOW (ref 135–145)

## 2017-02-17 LAB — URINALYSIS, COMPLETE (UACMP) WITH MICROSCOPIC
Bacteria, UA: NONE SEEN
Bilirubin Urine: NEGATIVE
GLUCOSE, UA: NEGATIVE mg/dL
Ketones, ur: NEGATIVE mg/dL
Nitrite: NEGATIVE
PH: 6 (ref 5.0–8.0)
PROTEIN: 30 mg/dL — AB
SPECIFIC GRAVITY, URINE: 1.011 (ref 1.005–1.030)
SQUAMOUS EPITHELIAL / LPF: NONE SEEN

## 2017-02-17 LAB — CBC
HEMATOCRIT: 46.2 % (ref 40.0–52.0)
HEMOGLOBIN: 15.2 g/dL (ref 13.0–18.0)
MCH: 30.1 pg (ref 26.0–34.0)
MCHC: 33 g/dL (ref 32.0–36.0)
MCV: 91.4 fL (ref 80.0–100.0)
Platelets: 189 10*3/uL (ref 150–440)
RBC: 5.06 MIL/uL (ref 4.40–5.90)
RDW: 13.9 % (ref 11.5–14.5)
WBC: 12.7 10*3/uL — ABNORMAL HIGH (ref 3.8–10.6)

## 2017-02-17 MED ORDER — CEPHALEXIN 500 MG PO CAPS
500.0000 mg | ORAL_CAPSULE | Freq: Once | ORAL | Status: AC
  Administered 2017-02-17: 500 mg via ORAL
  Filled 2017-02-17: qty 1

## 2017-02-17 MED ORDER — CEPHALEXIN 500 MG PO CAPS: 500.0000 mg | ORAL_CAPSULE | Freq: Two times a day (BID) | ORAL | 0 refills | Status: AC

## 2017-02-17 NOTE — Discharge Instructions (Addendum)
Take the antibiotic as prescribed and finish the full course.  Follow-up with your urologist on the fifth as scheduled.  Return to the emergency department immediately for new, worsening, recurrent urinary retention, lack of urine passing through the Foley, abdominal pain, fevers, weakness, back or flank pain, or any other new or worsening symptoms that concern you.

## 2017-02-17 NOTE — ED Provider Notes (Signed)
[facility] Emergency Department Provider Note ____________________________________________   First MD Initiated Contact with Patient 02/17/17 1551     (approximate)  I have reviewed the triage vital signs and the nursing notes.   HISTORY  Chief Complaint Urinary Retention    HPI Aaron Quinn is a 83 y.o. male who presents with urinary retention, acute onset this morning, persistent course, and associated with lower abdominal discomfort.  Patient states that he had a transurethral resection of a bladder tumor 3 days ago, and had a Foley catheter in place until it was removed yesterday.  Patient states that yesterday evening he was urinating normally, but then it stopped today.  Patient denies any hematuria, dysuria, fever or chills, or flank pain.   Past Medical History:  Diagnosis Date  . Arthritis   . Complication of anesthesia    slow to get rid of anesthesia, over a couple of days.  . Diverticulosis   . Fibromyalgia 1995  . Glaucoma    pt denies  . History of kidney stones   . Hyperlipidemia   . Hypertension   . Positive TB test   . Tuberculosis 1963   pt's son hyad TB when he was 72 years old, pt did not contract TB  . Tubulovillous adenoma of colon     Patient Active Problem List   Diagnosis Date Noted  . Sciatica 01/17/2017  . Hematuria 12/20/2016  . Prediabetes 11/02/2016  . Neuropathy 01/07/2016  . Elevated glucose 10/07/2015  . Allergic rhinitis 10/07/2015  . Insomnia 10/07/2015  . Viral upper respiratory infection 06/21/2015  . Annual physical exam 04/09/2015  . Arthritis 03/10/2015  . Gout 03/10/2015  . H/O varicella 03/10/2015  . HLD (hyperlipidemia) 03/10/2015  . Fibromyalgia 03/09/2015  . Lightheadedness 03/09/2015  . Memory deficit 03/09/2015  . Essential hypertension 03/09/2015  . Hemorrhoids, internal, with bleeding 08/12/2014    Past Surgical History:  Procedure Laterality Date  . APPENDECTOMY  2005  .  CATARACT EXTRACTION, BILATERAL  2012  . COLONOSCOPY W/ BIOPSIES    . CYST EXCISION Right 1950's   thigh  . HEMORRHOID BANDING    . KNEE ARTHROSCOPY Left 2014  . KNEE CARTILAGE SURGERY Left 1951  . NASAL SINUS SURGERY  1950's  . TONSILLECTOMY  1940  . TRANSURETHRAL RESECTION OF BLADDER TUMOR N/A 02/13/2017   Procedure: TRANSURETHRAL RESECTION OF BLADDER TUMOR (TURBT);  Surgeon: Abbie Sons, MD;  Location: [facility] ORS;  Service: Urology;  Laterality: N/A;  45 minutes needed  . VASECTOMY  1972    Prior to Admission medications   Medication Sig Start Date End Date Taking? Authorizing Provider  amLODipine (NORVASC) 2.5 MG tablet Take 2.5 mg daily by mouth.    Yes [provider]  citalopram (CELEXA) 10 MG tablet Take 10 mg daily by mouth.    Yes [provider]  donepezil (ARICEPT) 5 MG tablet Take 5 mg by mouth at bedtime.    Yes [provider]  fluticasone (FLONASE) 50 MCG/ACT nasal spray Place 1 spray daily into both nostrils.    Yes [provider]  gabapentin (NEURONTIN) 300 MG capsule Take 400 mg by mouth 4 (four) times daily.    Yes [provider]  hydrochlorothiazide (HYDRODIURIL) 25 MG tablet TAKE 1 TABLET DAILY 08/22/16  Yes Leone Haven, MD  ketoconazole (NIZORAL) 2 % shampoo SHAMPOO INTO SCALP TWICE PER WEEK. 01/10/15  Yes [provider]  loratadine (CLARITIN) 10 MG tablet Take 10 mg by  mouth daily.   Yes [provider]  LYRICA 25 MG capsule Take 25 mg by mouth 3 (three) times daily.  10/10/16  Yes [provider]  meloxicam (MOBIC) 15 MG tablet Take 15 mg by mouth daily. with food 07/29/15  Yes [provider]  zolpidem (AMBIEN) 10 MG tablet Take 1 tablet (10 mg total) at bedtime as needed by mouth for sleep. Patient taking differently: Take 10 mg at bedtime by mouth.  01/31/17 03/02/17 Yes Arnett, Yvetta Coder, FNP  oxybutynin (DITROPAN) 5 MG tablet 1 tab tid prn frequency,urgency, bladder  spasm Patient not taking: Reported on 02/17/2017 02/13/17   Abbie Sons, MD  traMADol (ULTRAM) 50 MG tablet Take 1 tablet (50 mg total) by mouth every 6 (six) hours as needed for moderate pain. Patient not taking: Reported on 02/17/2017 02/13/17   Abbie Sons, MD    Allergies Patient has no known allergies.  Family History  Problem Relation Age of Onset  . Colon cancer Maternal Grandmother 68       Died at 74  . Heart disease Father   . Arthritis Unknown        Parent  . Hypertension Unknown        Parent, grandparent  . Diabetes Son   . Alcohol abuse Son   . Depression Son   . Colon polyps Neg Hx   . Kidney disease Neg Hx   . Esophageal cancer Neg Hx   . Gallbladder disease Neg Hx     Social History Social History   Tobacco Use  . Smoking status: Former Smoker    Packs/day: 0.50    Years: 20.00    Pack years: 10.00    Types: Cigarettes    Last attempt to quit: 03/20/1982    Years since quitting: 34.9  . Smokeless tobacco: Never Used  Substance Use Topics  . Alcohol use: No    Alcohol/week: 0.0 oz  . Drug use: No    Review of Systems  Constitutional: No fever/chills. Eyes: No redness. ENT: No sore throat. Cardiovascular: Denies chest pain. Respiratory: Denies shortness of breath. Gastrointestinal: No nausea, no vomiting.   Genitourinary: Negative for dysuria or hematuria.  Musculoskeletal: Negative for back pain. Skin: Negative for rash. Neurological: Negative for headache.   ____________________________________________   PHYSICAL EXAM:  VITAL SIGNS: ED Triage Vitals  Enc Vitals Group     BP 02/17/17 1414 (!) 158/78     Pulse Rate 02/17/17 1414 72     Resp 02/17/17 1414 16     Temp 02/17/17 1414 98.3 F (36.8 C)     Temp Source 02/17/17 1414 Oral     SpO2 02/17/17 1414 96 %     Weight 02/17/17 1413 164 lb (74.4 kg)     Height 02/17/17 1413 5\' 11"  (1.803 m)     Head Circumference --      Peak Flow --      Pain Score --      Pain Loc  --      Pain Edu? --      Excl. in [place]? --     Constitutional: Alert and oriented. Well appearing and in no acute distress. Eyes: Conjunctivae are normal.  Head: Atraumatic. Nose: No congestion/rhinnorhea. Mouth/Throat: Mucous membranes are moist.   Neck: Normal range of motion.  Cardiovascular:  Good peripheral circulation. Respiratory: Normal respiratory effort.  No retractions.  Gastrointestinal: Soft with mild suprapubic tenderness. No distention.  Genitourinary: No CVA or flank tenderness.  Musculoskeletal:   Extremities warm and well perfused.  Neurologic:  Normal speech and language. No gross focal neurologic deficits are appreciated.  Skin:  Skin is warm and dry. No rash noted. Psychiatric: Mood and affect are normal. Speech and behavior are normal.  ____________________________________________   LABS (all labs ordered are listed, but only abnormal results are displayed)  Labs Reviewed  URINALYSIS, COMPLETE (UACMP) WITH MICROSCOPIC - Abnormal; Notable for the following components:      Result Value   Color, Urine YELLOW (*)    APPearance HAZY (*)    Hgb urine dipstick LARGE (*)    Protein, ur 30 (*)    Leukocytes, UA TRACE (*)    All other components within normal limits  BASIC METABOLIC PANEL - Abnormal; Notable for the following components:   Sodium 132 (*)    Chloride 94 (*)    Glucose, Bld 172 (*)    BUN 33 (*)    Creatinine, Ser 1.40 (*)    GFR calc non Af Amer 45 (*)    GFR calc Af Amer 52 (*)    All other components within normal limits  CBC - Abnormal; Notable for the following components:   WBC 12.7 (*)    All other components within normal limits   ____________________________________________  EKG   ____________________________________________  RADIOLOGY    ____________________________________________   PROCEDURES  Procedure(s) performed: No    Critical Care performed: No ____________________________________________   INITIAL  IMPRESSION / ASSESSMENT AND PLAN / ED COURSE  Pertinent labs & imaging results that were available during my care of the patient were reviewed by me and considered in my medical decision making (see chart for details).  83 year old male with history of recent urologic procedure presents with acute urinary retention since this morning, after his catheter was removed yesterday.  Patient did urinate normally for some time after the catheter was initially removed.  Patient reports associated suprapubic discomfort but denies back or flank pain.  No fever, hematuria, or dysuria.  On exam, patient is relatively well-appearing, and there is suprapubic discomfort but no other significant exam findings.  Review of past medical records in [place] confirms the history of a TURBT procedure on 11/27.  I suspect likely urinary retention due to bleeding or inflammatory changes causing a clot.  Patient has no clinical symptoms of UTI.  We will obtain a UA, basic labs to verify patient's kidney function, and place a new Foley catheter.    ----------------------------------------- 5:40 PM on 02/17/2017 -----------------------------------------  Patient has resolved symptoms after Foley was placed and drained a significant amount of urine.  I am concerned the patient could in fact have an infection given that he is has significant WBCs and leukocytes on his UA, as well as a slightly elevated white count.  I will therefore start him on Keflex until he follows up with his urologist on the fifth.  Patient agrees with this plan.  Patient's creatinine is also minimally elevated compared to his baseline, however now that the obstruction is resolved and patient is urinating normally I suspect that this will improve.  No indication to further observe patient or work him up in the ED.  Patient feels well and would like to go home.  Discharge instructions and return precautions given, and patient expresses  understanding.  ____________________________________________   FINAL CLINICAL IMPRESSION(S) / ED DIAGNOSES  Final diagnoses:  Urinary retention      NEW MEDICATIONS STARTED DURING THIS VISIT:  This SmartLink is deprecated.  Use AVSMEDLIST instead to display the medication list for a patient.   Note:  This document was prepared using Dragon voice recognition software and may include unintentional dictation errors.    Arta Silence, MD 02/17/17 1742

## 2017-02-17 NOTE — ED Triage Notes (Signed)
Pt had transurethral resection of bladder tumor 11/27 and started with urinary retention today.  Last urination was 830 AM today.  Has some pressure but denies pain. No known fevers but has had "sweats".  Ambulatory to triage.

## 2017-02-19 ENCOUNTER — Telehealth: Payer: Self-pay | Admitting: Family Medicine

## 2017-02-19 ENCOUNTER — Ambulatory Visit: Payer: Medicare Other | Attending: Family Medicine | Admitting: Physical Therapy

## 2017-02-19 DIAGNOSIS — F5101 Primary insomnia: Secondary | ICD-10-CM

## 2017-02-19 NOTE — Telephone Encounter (Signed)
Last OV 01/17/2017 Next OV 04/20/2017 Last refill 01/31/2017  Aaron Quinn sent in 30 day with no refill while you were out of the office because she did not feel comfortable writing a 90 day script without ever seeing this patient.

## 2017-02-19 NOTE — Telephone Encounter (Signed)
Copied from [place]. Topic: General - Other >> Feb 07, 2017  9:35 AM Neva Seat wrote:  Dr. Caryl Bis needs to approve: Zolpidemtartae 10mg  14 day supply [facility] mails it to me. >> Feb 19, 2017 11:22 AM Oneta Rack wrote: Relation to pt: self Call back [place]: [place], [place] [phone] (Phone) [phone] (Fax)     Reason for call:  Patient states PCP was out of the office and 30 day supply zolpidem (AMBIEN) 10 MG tablet was sent in, patient states PCP typically supplies a 90 day please send to mail order. Please advise

## 2017-02-20 MED ORDER — ZOLPIDEM TARTRATE 10 MG PO TABS: 10.0000 mg | ORAL_TABLET | Freq: Every evening | ORAL | 0 refills | Status: DC | PRN

## 2017-02-20 NOTE — Telephone Encounter (Signed)
faxed

## 2017-02-20 NOTE — Telephone Encounter (Signed)
Please fax

## 2017-02-21 ENCOUNTER — Encounter: Payer: Self-pay | Admitting: Urology

## 2017-02-21 ENCOUNTER — Ambulatory Visit: Payer: Medicare Other | Admitting: Urology

## 2017-02-21 ENCOUNTER — Ambulatory Visit (INDEPENDENT_AMBULATORY_CARE_PROVIDER_SITE_OTHER): Payer: Medicare Other | Admitting: Urology

## 2017-02-21 VITALS — BP 149/82 | HR 83 | Ht 71.0 in | Wt 173.0 lb

## 2017-02-21 DIAGNOSIS — R339 Retention of urine, unspecified: Secondary | ICD-10-CM

## 2017-02-21 DIAGNOSIS — C679 Malignant neoplasm of bladder, unspecified: Secondary | ICD-10-CM | POA: Diagnosis not present

## 2017-02-21 NOTE — Progress Notes (Signed)
02/21/2017 3:02 PM   Aaron Quinn 1932/01/15 [id number]  Referring provider: Leone Haven, MD [address] [place], [place] [address]  Chief Complaint  Patient presents with  . Routine Post Op    Pathology results    HPI: 83 y.o male presents for a postop follow-up.  He is status post TURBT on 11/27.  Clinic cystoscopy was remarkable for a right lateral wall bladder tumor however intraoperatively he was found to have bladder tumor at the trigone and a papillary tumor at the anterior aspect of the bladder near the left bladder neck.  His Foley catheter was removed on 02/16/2017 and he states he voided well for 36 hours then had acute onset of urinary retention necessitating an ED visit where the Foley catheter was replaced.  At the time of his urinary retention he denied gross hematuria.  Pathology:  DIAGNOSIS:  A. BLADDER TUMOR, RIGHT LATERAL WALL; TRANSURETHRAL RESECTION:  - NON-INVASIVE PAPILLARY UROTHELIAL CARCINOMA, LOW-GRADE.  - MUSCULARIS PROPRIA IS PRESENT AND NOT INVOLVED.   B. 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  - MUSCULARIS PROPRIA IS PRESENT AND NOT INVOLVED.   C. BLADDER TUMOR, LEFT BLADDER NECK; TRANSURETHRAL RESECTION:  - NON-INVASIVE PAPILLARY UROTHELIAL CARCINOMA, HIGH GRADE.  - MUSCULARIS PROPRIA IS PRESENT AND NOT INVOLVED.   D. PAPILLARY TUMOR, PROSTATE; TRANSURETHRAL RESECTION:  - PROSTATIC URETHRAL POLYP.  - NEGATIVE FOR MALIGNANCY.   Comment:  Additional deeper sections were reviewed on part B, the trigone tumor.  It shows both papillary fragments and areas of inverted growth pattern  with expansile nests of tumor cells. The stromal-epithelial interface  has a smooth contour. No definite infiltration of the lamina propria is  identified, and there is no invasion of the muscularis propria.   The histologic features for  A, B, and C are consistent with tumor  classification Ta.    Otherwise he had no postoperative complaints.  PMH: Past Medical History:  Diagnosis Date  . Arthritis   . Complication of anesthesia    slow to get rid of anesthesia, over a couple of days.  . Diverticulosis   . Fibromyalgia 1995  . Glaucoma    pt denies  . History of kidney stones   . Hyperlipidemia   . Hypertension   . Positive TB test   . Tuberculosis 1963   pt's son hyad TB when he was 52 years old, pt did not contract TB  . Tubulovillous adenoma of colon     Surgical History: Past Surgical History:  Procedure Laterality Date  . APPENDECTOMY  2005  . CATARACT EXTRACTION, BILATERAL  2012  . COLONOSCOPY W/ BIOPSIES    . CYST EXCISION Right 1950's   thigh  . HEMORRHOID BANDING    . KNEE ARTHROSCOPY Left 2014  . KNEE CARTILAGE SURGERY Left 1951  . NASAL SINUS SURGERY  1950's  . TONSILLECTOMY  1940  . TRANSURETHRAL RESECTION OF BLADDER TUMOR N/A 02/13/2017   Procedure: TRANSURETHRAL RESECTION OF BLADDER TUMOR (TURBT);  Surgeon: Abbie Sons, MD;  Location: [facility] ORS;  Service: Urology;  Laterality: N/A;  45 minutes needed  . VASECTOMY  1972    Home Medications:  Allergies as of 02/21/2017   No Known Allergies     Medication List        Accurate as of 02/21/17  3:02 PM. Always use your most recent med list.  amLODipine 2.5 MG tablet Commonly known as:  NORVASC Take 2.5 mg daily by mouth.   cephALEXin 500 MG capsule Commonly known as:  KEFLEX Take 1 capsule (500 mg total) by mouth 2 (two) times daily for 5 days.   citalopram 10 MG tablet Commonly known as:  CELEXA Take 10 mg daily by mouth.   donepezil 5 MG tablet Commonly known as:  ARICEPT Take 5 mg by mouth at bedtime.   FLONASE 50 MCG/ACT nasal spray Generic drug:  fluticasone Place 1 spray daily into both nostrils.   gabapentin 300 MG capsule Commonly known as:  NEURONTIN Take 400 mg by mouth 4 (four) times daily.     hydrochlorothiazide 25 MG tablet Commonly known as:  HYDRODIURIL TAKE 1 TABLET DAILY   ketoconazole 2 % shampoo Commonly known as:  NIZORAL SHAMPOO INTO SCALP TWICE PER WEEK.   loratadine 10 MG tablet Commonly known as:  CLARITIN Take 10 mg by mouth daily.   LYRICA 25 MG capsule Generic drug:  pregabalin Take 25 mg by mouth 3 (three) times daily.   meloxicam 15 MG tablet Commonly known as:  MOBIC Take 15 mg by mouth daily. with food   oxybutynin 5 MG tablet Commonly known as:  DITROPAN 1 tab tid prn frequency,urgency, bladder spasm   traMADol 50 MG tablet Commonly known as:  ULTRAM Take 1 tablet (50 mg total) by mouth every 6 (six) hours as needed for moderate pain.   zolpidem 10 MG tablet Commonly known as:  AMBIEN Take 1 tablet (10 mg total) by mouth at bedtime as needed for sleep.       Allergies: No Known Allergies  Family History: Family History  Problem Relation Age of Onset  . Colon cancer Maternal Grandmother 46       Died at 21  . Heart disease Father   . Arthritis Unknown        Parent  . Hypertension Unknown        Parent, grandparent  . Diabetes Son   . Alcohol abuse Son   . Depression Son   . Colon polyps Neg Hx   . Kidney disease Neg Hx   . Esophageal cancer Neg Hx   . Gallbladder disease Neg Hx     Social History:  reports that he quit smoking about 34 years ago. His smoking use included cigarettes. He has a 10.00 pack-year smoking history. he has never used smokeless tobacco. He reports that he does not drink alcohol or use drugs.  ROS: UROLOGY Frequent Urination?: No Hard to postpone urination?: No Burning/pain with urination?: No Get up at night to urinate?: No Leakage of urine?: No Urine stream starts and stops?: No Trouble starting stream?: No Do you have to strain to urinate?: No Blood in urine?: No Urinary tract infection?: No Sexually transmitted disease?: No Injury to kidneys or bladder?: No Painful intercourse?:  No Weak stream?: No Erection problems?: No Penile pain?: No  Gastrointestinal Nausea?: No Vomiting?: No Indigestion/heartburn?: No Diarrhea?: No Constipation?: No  Constitutional Fever: No Night sweats?: No Weight loss?: No Fatigue?: Yes  Skin Skin rash/lesions?: No Itching?: No  Eyes Blurred vision?: No Double vision?: No  Ears/Nose/Throat Sore throat?: No Sinus problems?: No  Hematologic/Lymphatic Swollen glands?: No Easy bruising?: No  Cardiovascular Leg swelling?: No Chest pain?: No  Respiratory Cough?: No Shortness of breath?: No  Endocrine Excessive thirst?: No  Musculoskeletal Back pain?: Yes Joint pain?: Yes  Neurological Headaches?: No Dizziness?: No  Psychologic Depression?: No Anxiety?:  No  Physical Exam: BP (!) 149/82 (BP Location: Right Arm, Patient Position: Sitting, Cuff Size: Normal)   Pulse 83   Ht 5\' 11"  (1.803 m)   Wt 173 lb (78.5 kg)   BMI 24.13 kg/m   Constitutional:  Alert and oriented, No acute distress. HEENT: [place] AT, moist mucus membranes.  Trachea midline, no masses. Cardiovascular: No clubbing, cyanosis, or edema. Respiratory: Normal respiratory effort, no increased work of breathing. GI: Abdomen is soft, nontender, nondistended, no abdominal masses GU: No CVA tenderness.  Foley catheter draining clear urine Skin: No rashes, bruises or suspicious lesions. Lymph: No cervical or inguinal adenopathy. Neurologic: Grossly intact, no focal deficits, moving all 4 extremities. Psychiatric: Normal mood and affect.  Laboratory Data: Lab Results  Component Value Date   WBC 12.7 (H) 02/17/2017   HGB 15.2 02/17/2017   HCT 46.2 02/17/2017   MCV 91.4 02/17/2017   PLT 189 02/17/2017    Lab Results  Component Value Date   CREATININE 1.40 (H) 02/17/2017    Lab Results  Component Value Date   HGBA1C 6.0 11/02/2016    Urinalysis Lab Results  Component Value Date   SPECGRAV 1.025 01/29/2017   PHUR 5.5 01/29/2017    COLORU Yellow 01/29/2017   APPEARANCEUR HAZY (A) 02/17/2017   LEUKOCYTESUR TRACE (A) 02/17/2017   PROTEINUR 30 (A) 02/17/2017   GLUCOSEU NEGATIVE 02/17/2017   KETONESU Negative 01/29/2017   RBCU TOO NUMEROUS TO COUNT 02/17/2017   BILIRUBINUR NEGATIVE 02/17/2017   UUROB 0.2 01/29/2017   NITRITE NEGATIVE 02/17/2017    Lab Results  Component Value Date   LABMICR See below: 01/29/2017   WBCUA 6-10 (A) 01/29/2017   RBCUA 11-30 (A) 01/29/2017   LABEPIT None seen 01/29/2017   MUCUS Present (A) 01/29/2017   BACTERIA NONE SEEN 02/17/2017    Pertinent Imaging:  Results for orders placed during the hospital encounter of 01/24/17  CT HEMATURIA WORKUP   Narrative CLINICAL DATA:  Microscopic hematuria. History kidney stone. Painless hematuria.  EXAM: CT ABDOMEN AND PELVIS WITHOUT AND WITH CONTRAST  TECHNIQUE: Multidetector CT imaging of the abdomen and pelvis was performed following the standard protocol before and following the bolus administration of intravenous contrast.  CONTRAST:  152mL ISOVUE-300 IOPAMIDOL (ISOVUE-300) INJECTION 61%  COMPARISON:  04/25/2004  FINDINGS: Lower chest: Anterior lung base scarring and bilaterally. Normal heart size without pericardial or pleural effusion. Multivessel coronary artery atherosclerosis.  Hepatobiliary: Old granulomatous disease in the liver. Mild hepatic steatosis. No focal liver lesion. Normal gallbladder, without biliary ductal dilatation.  Pancreas: Borderline soft tissue fullness relatively diffusely throughout the pancreas is similar to 04/25/2004. more focal hypoattenuation within the pancreatic tail which is new, measuring on the order of 2.0 cm on image 27/series 4. No acute pancreatitis.  Spleen: Normal in size, without focal abnormality.  Adrenals/Urinary Tract: Normal adrenal glands. Punctate lower pole right renal collecting system calculus. Too small to characterize bilateral renal lesions. No suspicious renal  lesion on post-contrast imaging. Moderate renal collecting system opacification on delayed images. Portions of the right ureter incompletely opacified on delays. No filling defect identified.  Enhancing right-sided bladder lesion, including at 2.2 x 2.1 cm on image 69/series 4. No extension outside the bladder.  Stomach/Bowel: Normal stomach, without wall thickening. Scattered colonic diverticula. Normal terminal ileum. Appendectomy. Normal small bowel.  Vascular/Lymphatic: Aortic and branch vessel atherosclerosis. No abdominopelvic adenopathy.  Reproductive: Mild prostatomegaly.  Other: No significant free fluid.  Musculoskeletal: Osteopenia. Right greater than left hip osteoarthritis. Bilateral L5 pars defects.  Grade 1 L5-S1 anterolisthesis. Lumbosacral spondylosis.  IMPRESSION: 1. Right-sided bladder lesion, highly suspicious for urothelial carcinoma. No evidence of metastatic disease. 2. Right nephrolithiasis. 3. Cystic structure within the pancreatic tail could represent a cystic lesion such as a pseudocyst or indolent cystic neoplasm versus area of focal duct dilatation. Consider nonemergent outpatient dedicated pre and post contrast abdominal MRI/MRCP. 4. Mild hepatic steatosis. These results will be called to the ordering clinician or representative by the Radiologist Assistant, and communication documented in the PACS or zVision Dashboard.   Electronically Signed   By: Abigail Miyamoto M.D.   On: 01/24/2017 15:10      Assessment & Plan:    1.  Urothelial carcinoma Ta urothelial carcinoma-high risk.  The pathology report was discussed in detail with Mr. Fick and his wife.  He was informed he is at risk for recurrence and potential progression.  I recommended an induction BCG course X 6 weeks.  We discussed the standard of care at this point would be induction BCG followed by maintenance BCG assuming no recurrences. He understands the risks, benefits,  indications of BCG. He understands the risks include dysuria, suprapubic discomfort, feeling similar to urinary tract infection, and a very small risk of BCG sepsis. He was also informed that he would need to bleach his toilet after his first void after each treatment due to the fact that BCG is a living organism. All questions were answered. The patient has elected to proceed with BCG therapy. He'll be scheduled for induction BCG in 6 weeks. He will be scheduled for cystoscopy 2-3 months after completion of his BCG.  2.  Urinary retention He was instructed in Foley catheter removal and will remove his catheter first thing in the morning.  He will contact the office for recurrent urinary retention or voiding problems.   Abbie Sons, [place] [address] [place], [place] [address] [phone]

## 2017-02-22 ENCOUNTER — Ambulatory Visit: Payer: Medicare Other | Admitting: Physical Therapy

## 2017-02-22 DIAGNOSIS — C679 Malignant neoplasm of bladder, unspecified: Secondary | ICD-10-CM | POA: Insufficient documentation

## 2017-02-22 DIAGNOSIS — Z8551 Personal history of malignant neoplasm of bladder: Secondary | ICD-10-CM | POA: Insufficient documentation

## 2017-02-27 ENCOUNTER — Ambulatory Visit: Payer: Medicare Other | Admitting: Physical Therapy

## 2017-03-27 NOTE — Progress Notes (Signed)
   03/28/2017 11:23 AM   Aaron Quinn [birth year] [id number]  Referring provider: Leone Haven, MD [address] [place], [place] [address]  Chief Complaint  Patient presents with  . Other    BCG#1     HPI: 84 yo WM who presents today to start an induction course of BCG.  This will be #1/6.    Background history 84 y.o male presents for a postop follow-up.  He is status post TURBT on 11/27.  Clinic cystoscopy was remarkable for a right lateral wall bladder tumor however intraoperatively he was found to have bladder tumor at the trigone and a papillary tumor at the anterior aspect of the bladder near the left bladder neck.  His Foley catheter was removed on 02/16/2017 and he states he voided well for 36 hours then had acute onset of urinary retention necessitating an ED visit where the Foley catheter was replaced.  At the time of his urinary retention he denied gross hematuria.  Pathology:  DIAGNOSIS:  A. BLADDER TUMOR, RIGHT LATERAL WALL; TRANSURETHRAL RESECTION:  - NON-INVASIVE PAPILLARY UROTHELIAL CARCINOMA, LOW-GRADE.  - MUSCULARIS PROPRIA IS PRESENT AND NOT INVOLVED.   B. 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  - MUSCULARIS PROPRIA IS PRESENT AND NOT INVOLVED.   C. BLADDER TUMOR, LEFT BLADDER NECK; TRANSURETHRAL RESECTION:  - NON-INVASIVE PAPILLARY UROTHELIAL CARCINOMA, HIGH GRADE.  - MUSCULARIS PROPRIA IS PRESENT AND NOT INVOLVED.   D. PAPILLARY TUMOR, PROSTATE; TRANSURETHRAL RESECTION:  - PROSTATIC URETHRAL POLYP.  - NEGATIVE FOR MALIGNANCY.   Comment:  Additional deeper sections were reviewed on part B, the trigone tumor.  It shows both papillary fragments and areas of inverted growth pattern  with expansile nests of tumor cells. The stromal-epithelial interface  has a smooth contour. No definite infiltration of the lamina propria is    identified, and there is no invasion of the muscularis propria.   The histologic features for A, B, and C are consistent with tumor  classification Ta.     Laboratory Data: Urinalysis 3-10 RBC's.   See Epic.     Assessment & Plan:    1.  Urothelial carcinoma  - Ta urothelial carcinoma-high risk.    - Reviewed BCG treatment course, possible side effects including BCG sepsis, bladder irritation, worsening of her urinary symptoms  - # 1 of 6 BCG installed today  - Patient was instructed to pour bleach down his/her toilet for the next 6 hours  -  Instructed to call the office if she should experience fevers greater than 102, chills/rigors, onset of a new cough, night sweats or further bladder spasms or inability to urinate   - RTC in one week for # 2 BCG  - Surveillance protocol also discussed today including cystoscopy every 3 months for at least 2 years and then spread out thereafter  - Urinalysis, Complete  - bcg vaccine injection 81 mg; Instill 3.24 mLs (81 mg total) into the bladder once.  - lidocaine (XYLOCAINE) 2 % jelly 1 application; Place 1 application into the urethra once.    [name], PA-C  [facility] [address], [place], [place] [address] [phone]

## 2017-03-28 ENCOUNTER — Encounter: Payer: Self-pay | Admitting: Urology

## 2017-03-28 ENCOUNTER — Ambulatory Visit: Payer: Medicare Other | Admitting: Urology

## 2017-03-28 ENCOUNTER — Ambulatory Visit (INDEPENDENT_AMBULATORY_CARE_PROVIDER_SITE_OTHER): Payer: Medicare Other | Admitting: Urology

## 2017-03-28 VITALS — BP 162/78 | HR 68 | Ht 70.0 in | Wt 165.0 lb

## 2017-03-28 DIAGNOSIS — C679 Malignant neoplasm of bladder, unspecified: Secondary | ICD-10-CM

## 2017-03-28 LAB — URINALYSIS, COMPLETE
Bilirubin, UA: NEGATIVE
Glucose, UA: NEGATIVE
Ketones, UA: NEGATIVE
LEUKOCYTES UA: NEGATIVE
Nitrite, UA: NEGATIVE
PH UA: 5.5 (ref 5.0–7.5)
PROTEIN UA: NEGATIVE
Specific Gravity, UA: 1.02 (ref 1.005–1.030)
UUROB: 0.2 mg/dL (ref 0.2–1.0)

## 2017-03-28 LAB — MICROSCOPIC EXAMINATION
BACTERIA UA: NONE SEEN
Epithelial Cells (non renal): NONE SEEN /hpf (ref 0–10)

## 2017-03-28 MED ORDER — BCG LIVE 50 MG IS SUSR
3.2400 mL | Freq: Once | INTRAVESICAL | Status: AC
  Administered 2017-03-28: 81 mg via INTRAVESICAL

## 2017-03-28 NOTE — Progress Notes (Signed)
BCG Bladder Instillation  BCG # 1  Due to Bladder Cancer patient is present today for a BCG treatment. Patient was cleaned and prepped in a sterile fashion with betadine and lidocaine 2% jelly was instilled into the urethra.  A 14FR catheter was inserted, urine return was noted 40ml, urine was yellow in color.  41ml of reconstituted BCG was instilled into the bladder. The catheter was then removed. Patient tolerated well, no complications were noted  Preformed by: Toniann Fail, LPN

## 2017-04-03 NOTE — Progress Notes (Signed)
   04/04/2017 11:07 AM   Aaron Quinn [birth year] [id number]  Referring provider: Leone Haven, MD [address] [place], [place] [address]  Chief Complaint  Patient presents with  . Other    BCG #2    HPI: 84 yo WM who presents today to start an induction course of BCG.  This will be #2/6.    Background history 84 y.o male presents for a postop follow-up.  He is status post TURBT on 11/27.  Clinic cystoscopy was remarkable for a right lateral wall bladder tumor however intraoperatively he was found to have bladder tumor at the trigone and a papillary tumor at the anterior aspect of the bladder near the left bladder neck.  His Foley catheter was removed on 02/16/2017 and he states he voided well for 36 hours then had acute onset of urinary retention necessitating an ED visit where the Foley catheter was replaced.  At the time of his urinary retention he denied gross hematuria.  Pathology:  DIAGNOSIS:  A. BLADDER TUMOR, RIGHT LATERAL WALL; TRANSURETHRAL RESECTION:  - NON-INVASIVE PAPILLARY UROTHELIAL CARCINOMA, LOW-GRADE.  - MUSCULARIS PROPRIA IS PRESENT AND NOT INVOLVED.   B. 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  - MUSCULARIS PROPRIA IS PRESENT AND NOT INVOLVED.   C. BLADDER TUMOR, LEFT BLADDER NECK; TRANSURETHRAL RESECTION:  - NON-INVASIVE PAPILLARY UROTHELIAL CARCINOMA, HIGH GRADE.  - MUSCULARIS PROPRIA IS PRESENT AND NOT INVOLVED.   D. PAPILLARY TUMOR, PROSTATE; TRANSURETHRAL RESECTION:  - PROSTATIC URETHRAL POLYP.  - NEGATIVE FOR MALIGNANCY.   Comment:  Additional deeper sections were reviewed on part B, the trigone tumor.  It shows both papillary fragments and areas of inverted growth pattern  with expansile nests of tumor cells. The stromal-epithelial interface  has a smooth contour. No definite infiltration of the lamina propria is    identified, and there is no invasion of the muscularis propria.   The histologic features for A, B, and C are consistent with tumor  classification Ta.     Laboratory Data: Urinalysis 6-10 WBC's.  3-10 RBC's.   See Epic.   Assessment & Plan:    1.  Urothelial carcinoma  - Ta urothelial carcinoma-high risk.    - Reviewed BCG treatment course, possible side effects including BCG sepsis, bladder irritation, worsening of her urinary symptoms  - # 2 of 6 BCG installed today  - Patient was instructed to pour bleach down his/her toilet for the next 6 hours  -  Instructed to call the office if she should experience fevers greater than 102, chills/rigors, onset of a new cough, night sweats or further bladder spasms or inability to urinate   - RTC in one week for # 3 BCG  - Surveillance protocol also discussed today including cystoscopy every 3 months for at least 2 years and then spread out thereafter  - Urinalysis, Complete  - bcg vaccine injection 81 mg; Instill 3.24 mLs (81 mg total) into the bladder once.  - lidocaine (XYLOCAINE) 2 % jelly 1 application; Place 1 application into the urethra once.    [name], PA-C  [facility] [address] [place], [place] [address] [phone]

## 2017-04-04 ENCOUNTER — Ambulatory Visit (INDEPENDENT_AMBULATORY_CARE_PROVIDER_SITE_OTHER): Payer: Medicare Other | Admitting: Urology

## 2017-04-04 DIAGNOSIS — C679 Malignant neoplasm of bladder, unspecified: Secondary | ICD-10-CM

## 2017-04-04 LAB — URINALYSIS, COMPLETE
Bilirubin, UA: NEGATIVE
GLUCOSE, UA: NEGATIVE
Ketones, UA: NEGATIVE
NITRITE UA: NEGATIVE
PH UA: 7 (ref 5.0–7.5)
PROTEIN UA: NEGATIVE
Specific Gravity, UA: 1.015 (ref 1.005–1.030)
Urobilinogen, Ur: 0.2 mg/dL (ref 0.2–1.0)

## 2017-04-04 LAB — MICROSCOPIC EXAMINATION

## 2017-04-04 MED ORDER — BCG LIVE 50 MG IS SUSR
3.2400 mL | Freq: Once | INTRAVESICAL | Status: AC
  Administered 2017-04-04: 81 mg via INTRAVESICAL

## 2017-04-04 NOTE — Progress Notes (Signed)
BCG Bladder Instillation  BCG # 2  Due to Bladder Cancer patient is present today for a BCG treatment. Patient was cleaned and prepped in a sterile fashion with betadine and lidocaine 2% jelly was instilled into the urethra.  A 14FR catheter was inserted, urine return was noted 23ml, urine was yellow in color.  75ml of reconstituted BCG was instilled into the bladder. The catheter was then removed. Patient tolerated well, no complications were noted  Preformed by: Toniann Fail, LPN

## 2017-04-09 ENCOUNTER — Encounter: Payer: Self-pay | Admitting: Urology

## 2017-04-11 ENCOUNTER — Encounter: Payer: Self-pay | Admitting: Urology

## 2017-04-11 ENCOUNTER — Ambulatory Visit (INDEPENDENT_AMBULATORY_CARE_PROVIDER_SITE_OTHER): Payer: Medicare Other | Admitting: Urology

## 2017-04-11 ENCOUNTER — Ambulatory Visit: Payer: Medicare Other

## 2017-04-11 VITALS — BP 117/68 | HR 77 | Ht 70.0 in | Wt 173.2 lb

## 2017-04-11 DIAGNOSIS — C679 Malignant neoplasm of bladder, unspecified: Secondary | ICD-10-CM

## 2017-04-11 LAB — URINALYSIS, COMPLETE
Bilirubin, UA: NEGATIVE
GLUCOSE, UA: NEGATIVE
Ketones, UA: NEGATIVE
Nitrite, UA: NEGATIVE
Specific Gravity, UA: 1.02 (ref 1.005–1.030)
Urobilinogen, Ur: 0.2 mg/dL (ref 0.2–1.0)
pH, UA: 6 (ref 5.0–7.5)

## 2017-04-11 LAB — MICROSCOPIC EXAMINATION: Epithelial Cells (non renal): NONE SEEN /hpf (ref 0–10)

## 2017-04-11 MED ORDER — BCG LIVE 50 MG IS SUSR
3.2400 mL | Freq: Once | INTRAVESICAL | Status: AC
Start: 2017-04-11 — End: 2017-04-11
  Administered 2017-04-11: 81 mg via INTRAVESICAL

## 2017-04-11 NOTE — Progress Notes (Signed)
BCG Bladder Instillation  BCG # 3  Due to Bladder Cancer patient is present today for a BCG treatment. Patient was cleaned and prepped in a sterile fashion with betadine and lidocaine 2% jelly was instilled into the urethra.  A 14FR catheter was inserted, urine return was noted 66ml, urine was light yellow in color.  45ml of reconstituted BCG was instilled into the bladder. The catheter was then removed. Patient tolerated well, no complications were noted  Preformed by: Elberta Leatherwood, CMA, Fonnie Jarvis, CMA  Follow up/ Additional notes: 1 week # 4

## 2017-04-18 ENCOUNTER — Ambulatory Visit (INDEPENDENT_AMBULATORY_CARE_PROVIDER_SITE_OTHER): Payer: Medicare Other | Admitting: Urology

## 2017-04-18 ENCOUNTER — Encounter: Payer: Self-pay | Admitting: Urology

## 2017-04-18 VITALS — BP 153/81 | HR 69 | Ht 70.0 in | Wt 175.5 lb

## 2017-04-18 DIAGNOSIS — C679 Malignant neoplasm of bladder, unspecified: Secondary | ICD-10-CM | POA: Diagnosis not present

## 2017-04-18 LAB — MICROSCOPIC EXAMINATION: EPITHELIAL CELLS (NON RENAL): NONE SEEN /HPF (ref 0–10)

## 2017-04-18 LAB — URINALYSIS, COMPLETE
BILIRUBIN UA: NEGATIVE
Glucose, UA: NEGATIVE
Ketones, UA: NEGATIVE
Nitrite, UA: NEGATIVE
PH UA: 6.5 (ref 5.0–7.5)
Specific Gravity, UA: 1.02 (ref 1.005–1.030)
UUROB: 0.2 mg/dL (ref 0.2–1.0)

## 2017-04-18 MED ORDER — BCG LIVE 50 MG IS SUSR
3.2400 mL | Freq: Once | INTRAVESICAL | Status: AC
  Administered 2017-04-18: 81 mg via INTRAVESICAL

## 2017-04-18 NOTE — Progress Notes (Signed)
BCG Bladder Instillation  BCG # 4  Due to Bladder Cancer patient is present today for a BCG treatment. Patient was cleaned and prepped in a sterile fashion with betadine and lidocaine 2% jelly was instilled into the urethra.  A 14FR catheter was inserted, urine return was noted 28ml, urine was yellow in color.  63ml of reconstituted BCG was instilled into the bladder. The catheter was then removed. Patient tolerated well, no complications were noted  Preformed by: Elberta Leatherwood, CMA, Fonnie Jarvis, CMA  Follow up/ Additional notes: 1 week #5  Attestation: I was present in the office and available. John Giovanni, MD

## 2017-04-20 ENCOUNTER — Ambulatory Visit: Payer: Medicare Other | Admitting: Family Medicine

## 2017-04-20 ENCOUNTER — Telehealth: Payer: Self-pay | Admitting: Family Medicine

## 2017-04-20 ENCOUNTER — Encounter: Payer: Self-pay | Admitting: Family Medicine

## 2017-04-20 ENCOUNTER — Ambulatory Visit: Payer: Medicare Other

## 2017-04-20 VITALS — BP 142/70 | HR 67 | Temp 97.5°F | Resp 17 | Ht 70.0 in | Wt 176.0 lb

## 2017-04-20 DIAGNOSIS — I10 Essential (primary) hypertension: Secondary | ICD-10-CM

## 2017-04-20 DIAGNOSIS — M5431 Sciatica, right side: Secondary | ICD-10-CM | POA: Diagnosis not present

## 2017-04-20 DIAGNOSIS — R413 Other amnesia: Secondary | ICD-10-CM

## 2017-04-20 DIAGNOSIS — Z1331 Encounter for screening for depression: Secondary | ICD-10-CM

## 2017-04-20 DIAGNOSIS — C679 Malignant neoplasm of bladder, unspecified: Secondary | ICD-10-CM | POA: Diagnosis not present

## 2017-04-20 DIAGNOSIS — T148XXA Other injury of unspecified body region, initial encounter: Secondary | ICD-10-CM | POA: Diagnosis not present

## 2017-04-20 DIAGNOSIS — Z0000 Encounter for general adult medical examination without abnormal findings: Secondary | ICD-10-CM

## 2017-04-20 DIAGNOSIS — N179 Acute kidney failure, unspecified: Secondary | ICD-10-CM | POA: Diagnosis not present

## 2017-04-20 DIAGNOSIS — F5101 Primary insomnia: Secondary | ICD-10-CM

## 2017-04-20 LAB — CBC
HCT: 44.2 % (ref 39.0–52.0)
Hemoglobin: 15 g/dL (ref 13.0–17.0)
MCHC: 33.9 g/dL (ref 30.0–36.0)
MCV: 90.3 fl (ref 78.0–100.0)
Platelets: 192 10*3/uL (ref 150.0–400.0)
RBC: 4.9 Mil/uL (ref 4.22–5.81)
RDW: 14.2 % (ref 11.5–15.5)
WBC: 4.9 10*3/uL (ref 4.0–10.5)

## 2017-04-20 LAB — BASIC METABOLIC PANEL
BUN: 20 mg/dL (ref 6–23)
CALCIUM: 9.8 mg/dL (ref 8.4–10.5)
CO2: 28 mEq/L (ref 19–32)
CREATININE: 1.16 mg/dL (ref 0.40–1.50)
Chloride: 102 mEq/L (ref 96–112)
GFR: 63.83 mL/min (ref >60.00)
Glucose, Bld: 201 mg/dL — ABNORMAL HIGH (ref 70–99)
Potassium: 3.7 mEq/L (ref 3.5–5.1)
Sodium: 140 mEq/L (ref 135–145)

## 2017-04-20 MED ORDER — ZOLPIDEM TARTRATE 10 MG PO TABS: 10.0000 mg | ORAL_TABLET | Freq: Every evening | ORAL | 0 refills | Status: DC | PRN

## 2017-04-20 NOTE — Assessment & Plan Note (Signed)
Very minimal issues with this.  I suspect he is actually bumping his hand because scabs and bruising.  Aricept could put him at increased risk for this.  We will check a CBC to rule out platelet issues.

## 2017-04-20 NOTE — Assessment & Plan Note (Signed)
Chronic issue.  He is following with physical medicine and rehab.  He will see them again later this month.

## 2017-04-20 NOTE — Patient Instructions (Addendum)
  Mr. Bothwell , Thank you for taking time to come for your Medicare Wellness Visit. I appreciate your ongoing commitment to your health goals. Please review the following plan we discussed and let me know if I can assist you in the future.   Follow up with Dr. Caryl Bis as needed.    Bring a copy of your [place] and/or Living Will to be scanned into chart.  Have a great day!  These are the goals we discussed: Goals    . [place] Healthy Lifestyle     Stay hydrated  Low carb diet, low cholesterol diet Exercise with stretching.  Increase movements and walking as sciatica improves.       This is a list of the screening recommended for you and due dates:  Health Maintenance  Topic Date Due  . Pneumonia vaccines (2 of 2 - PPSV23) 02/17/2014  . Tetanus Vaccine  04/08/2025  . Flu Shot  Completed

## 2017-04-20 NOTE — Assessment & Plan Note (Signed)
Well-controlled at home.  Continue current regimen.  Recheck a BMP given low sodium previously.

## 2017-04-20 NOTE — Telephone Encounter (Signed)
-----   Message from Carlean Jews, [facility] sent at 04/20/2017 11:44 AM EST ----- Marykay Lex,   The package labeling directs patients to take at night with the hope of avoiding ADE of "nausea, diarrhea, insomnia, vomiting, muscle cramps, fatigue, and anorexia" at peak concentrations during the daytime. The thought is that they will just sleep through it. I found a retrospective study that looked at patients that reported sleep disturbances with Aricept. 49/103 (48%) patients taking it at night had sleep disturbances (insomnia, nightmares, vivid dreams) where as 21/83 (25%) Patients taking it in the morning had sleep disturbances.   It's worth a shot to try scheduling in the morning. He may experience GI sx, cramps, fatigue, decreased appetite however. Just tell him to keep an eye on it.   Chrys Racer ----- Message ----- From: Leone Haven, MD Sent: 04/20/2017  10:27 AM To: Carlean Jews, [facility]  Lind Covert,   This patient is on aricept for memory difficulties. He states he was advised to take it at night, though he has had some difficulty sleeping with taking it at night. I was not aware of any specific indication regarding night time dosing of aricept. I want to see if you knew anything about this. If there is no reason for this dosing schedule I am going to have him take it in the morning. Thanks.   Randall Hiss

## 2017-04-20 NOTE — Patient Instructions (Addendum)
Nice to see you. Please continue to monitor your blood pressure. We will contact you with your lab results.

## 2017-04-20 NOTE — Assessment & Plan Note (Signed)
Currently on Aricept for the last month.  He is unsure if there has been any benefit yet.  This could potentially be contributing to his sleep difficulties as insomnia is a side effect.  Also his bruising.  Discussed having him monitor these symptoms and I will check with our clinical pharmacist to see if there is any reason for him not to take the Aricept in the morning.

## 2017-04-20 NOTE — Assessment & Plan Note (Signed)
He is following with urology for this.  Undergoing BCG treatments.  He will continue to see them.

## 2017-04-20 NOTE — Assessment & Plan Note (Signed)
Mostly well controlled.  Continue Ambien.

## 2017-04-20 NOTE — Telephone Encounter (Signed)
Please let the patient know I heard back from our pharmacist.  She noted it would be worth trying in the morning which I feel is the best option given his difficulty sleeping with it.  He may have some GI symptoms with possible nausea, diarrhea, vomiting, or cramps and fatigue during the daytime if he takes it in the morning.  If he has any of those symptoms he should let us know.  Thanks.

## 2017-04-20 NOTE — Progress Notes (Signed)
Subjective:   Aaron Quinn is a 84 y.o. male who presents for Medicare Annual/Subsequent preventive examination.  Review of Systems:  No ROS.  Medicare Wellness Visit. Additional risk factors are reflected in the social history.  Cardiac Risk Factors include: advanced age (>63men, >73 women)     Objective:    Vitals: BP (!) 142/70 (Patient Position: Sitting, Cuff Size: Normal)   Pulse 67   Temp (!) 97.5 F (36.4 C) (Oral)   Resp 17   Ht 5\' 10"  (1.778 m)   Wt 176 lb (79.8 kg)   SpO2 95%   BMI 25.25 kg/m   Body mass index is 25.25 kg/m.  Advanced Directives 04/20/2017 02/17/2017 02/13/2017 02/07/2017 07/06/2016 04/11/2016  Does Patient Have a Medical Advance Directive? Yes Yes - Yes Yes Yes  Type of Advance Directive [place];Living will - - [place];Living will [place];Living will [place];Living will  Does patient want to make changes to medical advance directive? No - Patient declined - Yes (Inpatient - patient requests chaplain consult to change a medical advance directive) No - Patient declined - No - Patient declined  Copy of [place] in Chart? No - copy requested - Yes No - copy requested No - copy requested No - copy requested    Tobacco Social History   Tobacco Use  Smoking Status Former Smoker  . Packs/day: 0.50  . Years: 20.00  . Pack years: 10.00  . Types: Cigarettes  . Last attempt to quit: 03/20/1982  . Years since quitting: 35.1  Smokeless Tobacco Never Used     Counseling given: Not Answered   Clinical Intake:  Pre-visit preparation completed: Yes  Pain : No/denies pain     Nutritional Status: BMI 25 -29 Overweight Diabetes: No  How often do you need to have someone help you when you read instructions, pamphlets, or other written materials from your doctor or pharmacy?: 1 - Never  Interpreter Needed?: No     Past Medical History:    Diagnosis Date  . Arthritis   . Complication of anesthesia    slow to get rid of anesthesia, over a couple of days.  . Diverticulosis   . Fibromyalgia 1995  . Glaucoma    pt denies  . History of kidney stones   . Hyperlipidemia   . Hypertension   . Positive TB test   . Tuberculosis 1963   pt's son hyad TB when he was 24 years old, pt did not contract TB  . Tubulovillous adenoma of colon    Past Surgical History:  Procedure Laterality Date  . APPENDECTOMY  2005  . CATARACT EXTRACTION, BILATERAL  2012  . COLONOSCOPY W/ BIOPSIES    . CYST EXCISION Right 1950's   thigh  . HEMORRHOID BANDING    . KNEE ARTHROSCOPY Left 2014  . KNEE CARTILAGE SURGERY Left 1951  . NASAL SINUS SURGERY  1950's  . TONSILLECTOMY  1940  . TRANSURETHRAL RESECTION OF BLADDER TUMOR N/A 02/13/2017   Procedure: TRANSURETHRAL RESECTION OF BLADDER TUMOR (TURBT);  Surgeon: Abbie Sons, MD;  Location: [facility] ORS;  Service: Urology;  Laterality: N/A;  45 minutes needed  . VASECTOMY  1972   Family History  Problem Relation Age of Onset  . Colon cancer Maternal Grandmother 19       Died at 32  . Heart disease Father   . Arthritis Unknown  Parent  . Hypertension Unknown        Parent, grandparent  . Diabetes Son   . Alcohol abuse Son   . Depression Son   . Colon polyps Neg Hx   . Kidney disease Neg Hx   . Esophageal cancer Neg Hx   . Gallbladder disease Neg Hx    Social History   Socioeconomic History  . Marital status: Married    Spouse name: None  . Number of children: 2  . Years of education: None  . Highest education level: None  Social Needs  . Financial resource strain: None  . Food insecurity - worry: None  . Food insecurity - inability: None  . Transportation needs - medical: None  . Transportation needs - non-medical: None  Occupational History  . Occupation: Retired  Tobacco Use  . Smoking status: Former Smoker    Packs/day: 0.50    Years: 20.00    Pack years: 10.00     Types: Cigarettes    Last attempt to quit: 03/20/1982    Years since quitting: 35.1  . Smokeless tobacco: Never Used  Substance and Sexual Activity  . Alcohol use: No    Alcohol/week: 0.0 oz  . Drug use: No  . Sexual activity: No  Other Topics Concern  . None  Social History Narrative   Retired from [employer] Encounter Medications as of 04/20/2017  Medication Sig  . amLODipine (NORVASC) 2.5 MG tablet Take 2.5 mg daily by mouth.   . citalopram (CELEXA) 10 MG tablet Take 10 mg daily by mouth.   . donepezil (ARICEPT) 5 MG tablet Take 5 mg by mouth at bedtime.   . fluticasone (FLONASE) 50 MCG/ACT nasal spray Place 1 spray daily into both nostrils.   Marland Kitchen gabapentin (NEURONTIN) 300 MG capsule Take 400 mg by mouth 4 (four) times daily.   . hydrochlorothiazide (HYDRODIURIL) 25 MG tablet TAKE 1 TABLET DAILY  . ketoconazole (NIZORAL) 2 % shampoo SHAMPOO INTO SCALP TWICE PER WEEK.  Marland Kitchen loratadine (CLARITIN) 10 MG tablet Take 10 mg by mouth daily.  Marland Kitchen LYRICA 25 MG capsule Take 25 mg by mouth 3 (three) times daily.   . meloxicam (MOBIC) 15 MG tablet Take 15 mg by mouth daily. with food  . zolpidem (AMBIEN) 10 MG tablet Take 1 tablet (10 mg total) by mouth at bedtime as needed for sleep.  . [DISCONTINUED] traMADol (ULTRAM) 50 MG tablet Take 1 tablet (50 mg total) by mouth every 6 (six) hours as needed for moderate pain. (Patient not taking: Reported on 04/20/2017)   No facility-administered encounter medications on file as of 04/20/2017.     Activities of Daily Living In your present state of health, do you have any difficulty performing the following activities: 04/20/2017 02/13/2017  Hearing? N N  Vision? N N  Comment - -  Difficulty concentrating or making decisions? N N  Comment - -  Walking or climbing stairs? Y N  Comment Sciatica -  Dressing or bathing? N N  Doing errands, shopping? N -  Preparing Food and eating ? N -  Using the Toilet? N -  In the past six months, have  you accidently leaked urine? Y -  Comment Followed by Urologist and PCP -  Do you have problems with loss of bowel control? N -  Managing your Medications? N -  Managing your Finances? N -  Housekeeping or managing your Housekeeping? N -  Some recent data might be hidden  Patient Care Team: Leone Haven, MD as PCP - General (Family Medicine)   Assessment:   This is a routine wellness examination for [place]. The goal of the wellness visit is to assist the patient how to close the gaps in care and create a preventative care plan for the patient.   The roster of all physicians providing medical care to patient is listed in the Snapshot section of the chart.  Taking calcium VIT D as appropriate/Osteoporosis risk reviewed.    Safety issues reviewed; Smoke and carbon monoxide detectors in the home. No firearms in the home. Wears seatbelts when driving or riding with others. Patient does wear sunscreen or protective clothing when in direct sunlight. No violence in the home.  Depression- PHQ 2 &9 complete.  No signs/symptoms or verbal communication regarding little pleasure in doing things, feeling down, depressed or hopeless. No changes in sleeping, energy, eating, concentrating.  No thoughts of self harm or harm towards others.  Time spent on this topic is 8 minutes.   Patient is alert, normal appearance, oriented to person/place/and time. Correctly identified the president of the [place], recall of 2/3 words, and performing simple calculations. Displays appropriate judgement and can read correct time from watch face.   No new identified risk were noted.  No failures at ADL's or IADL's.    BMI- discussed the importance of a healthy diet, water intake and the benefits of aerobic exercise. Educational material provided.   24 hour diet recall: Regular diet  Daily fluid intake: 0 cups of caffeine, 6-8 cups of water  Dental- every 4 months.  Eye- Visual acuity not assessed per patient  preference since they have regular follow up with the ophthalmologist.  Wears corrective lenses.  Sleep patterns- Sleeps 7-8 hours at night.    Pneumococcal vaccine discussed.  Deferred for follow up with local pharmacy.   Patient Concerns: None at this time. Follow up with PCP as needed.  Exercise Activities and Dietary recommendations Current Exercise Habits: The patient does not participate in regular exercise at present  Goals    . [place] Healthy Lifestyle     Stay hydrated  Low carb diet, low cholesterol diet Exercise with stretching.  Increase movements and walking as sciatica improves.       Fall Risk Fall Risk  04/20/2017 04/11/2016  Falls in the past year? No No   Depression Screen PHQ 2/9 Scores 04/20/2017 04/11/2016  PHQ - 2 Score 0 0    Cognitive Function     6CIT Screen 04/20/2017 04/11/2016  What Year? 0 points 0 points  What month? 0 points 0 points  What time? 0 points 0 points  Count back from 20 0 points 0 points  Months in reverse 0 points 0 points  Repeat phrase - 0 points  Total Score - 0    Immunization History  Administered Date(s) Administered  . Influenza, High Dose Seasonal PF 01/07/2016  . Influenza-Unspecified 12/18/2014, 12/20/2016  . Tdap 04/09/2015   Screening Tests Health Maintenance  Topic Date Due  . PNA vac Low Risk Adult (2 of 2 - PPSV23) 02/17/2014  . TETANUS/TDAP  04/08/2025  . INFLUENZA VACCINE  Completed      Plan:    End of life planning; Advance aging; Advanced directives discussed. Copy of current HCPOA/Living Will requested.    I have personally reviewed and noted the following in the patient's chart:   . Medical and social history . Use of alcohol, tobacco or illicit drugs  .  Current medications and supplements . Functional ability and status . Nutritional status . Physical activity . Advanced directives . List of other physicians . Hospitalizations, surgeries, and ER visits in previous 12  months . Vitals . Screenings to include cognitive, depression, and falls . Referrals and appointments  In addition, I have reviewed and discussed with patient certain preventive protocols, quality metrics, and best practice recommendations. A written personalized care plan for preventive services as well as general preventive health recommendations were provided to patient.     Varney Biles, LPN  [id number]

## 2017-04-20 NOTE — Progress Notes (Signed)
Aaron Rumps, MD Phone: [phone]  JOHNMATTHEW SOLORIO is a 84 y.o. male who presents today for follow-up.  Hypertension: Well controlled at home.  Mostly in the 118-144/67-79 range.  Taking amlodipine, HCTZ.  No chest pain, shortness of breath, or edema.  Has been off of aspirin since his bladder surgery.  Prior to that he started having slight bruises on his left hand with a little bit of bleeding.  Easy to stop.  No bruising elsewhere.  No injury.  Has not restarted aspirin.  Found to have bladder cancer.  Had surgery to remove this.  He is undergoing BCG treatments with urology.  Was a former smoker.  No chemical use.  Quit smoking greater than 30 years ago.  He has had an injection in his back to help with his sciatic nerve issues.  That has been somewhat helpful.  He has another one later this month.  Continues to take Ambien nightly for sleep.  Last couple of nights it has taken a couple of hours for him to fall asleep.  He does get 6-7 hours nightly.  Wakes up pretty well rested.  He is taking Aricept at night and wonders if that could be causing the sleep issues over the last several days.  Social History   Tobacco Use  Smoking Status Former Smoker  . Packs/day: 0.50  . Years: 20.00  . Pack years: 10.00  . Types: Cigarettes  . Last attempt to quit: 03/20/1982  . Years since quitting: 35.1  Smokeless Tobacco Never Used     ROS see history of present illness  Objective  Physical Exam Vitals:   04/20/17 0946  BP: (!) 142/70  Pulse: 67  Resp: 17  Temp: (!) 97.5 F (36.4 C)  SpO2: 95%    BP Readings from Last 3 Encounters:  04/20/17 (!) 142/70  04/18/17 (!) 153/81  04/11/17 117/68   Wt Readings from Last 3 Encounters:  04/20/17 176 lb (79.8 kg)  04/18/17 175 lb 8 oz (79.6 kg)  04/11/17 173 lb 3.2 oz (78.6 kg)    Physical Exam  Constitutional: No distress.  Cardiovascular: Normal rate, regular rhythm and normal heart sounds.  Pulmonary/Chest: Effort  normal and breath sounds normal.  Musculoskeletal: He exhibits no edema.  Neurological: He is alert. Gait normal.  Skin: Skin is warm and dry. He is not diaphoretic.  Very small scab on the dorsum of his left hand, no significant bruising noted     Assessment/Plan: Please see individual problem list.  Essential hypertension Well-controlled at home.  Continue current regimen.  Recheck a BMP given low sodium previously.  Sciatica Chronic issue.  He is following with physical medicine and rehab.  He will see them again later this month.  Urothelial carcinoma of bladder [facility]) He is following with urology for this.  Undergoing BCG treatments.  He will continue to see them.  Memory deficit Currently on Aricept for the last month.  He is unsure if there has been any benefit yet.  This could potentially be contributing to his sleep difficulties as insomnia is a side effect.  Also his bruising.  Discussed having him monitor these symptoms and I will check with our clinical pharmacist to see if there is any reason for him not to take the Aricept in the morning.  Insomnia Mostly well controlled.  Continue Ambien.  Bruising Very minimal issues with this.  I suspect he is actually bumping his hand because scabs and bruising.  Aricept could put him  at increased risk for this.  We will check a CBC to rule out platelet issues.   Orders Placed This Encounter  Procedures  . Basic metabolic panel  . CBC    Meds ordered this encounter  Medications  . zolpidem (AMBIEN) 10 MG tablet    Sig: Take 1 tablet (10 mg total) by mouth at bedtime as needed for sleep.    Dispense:  90 tablet    Refill:  0     Aaron Rumps, MD [place]

## 2017-04-23 ENCOUNTER — Other Ambulatory Visit (INDEPENDENT_AMBULATORY_CARE_PROVIDER_SITE_OTHER): Payer: Medicare Other

## 2017-04-23 ENCOUNTER — Telehealth: Payer: Self-pay

## 2017-04-23 DIAGNOSIS — R7309 Other abnormal glucose: Secondary | ICD-10-CM

## 2017-04-23 LAB — HEMOGLOBIN A1C: Hgb A1c MFr Bld: 6.1 % (ref 4.6–6.5)

## 2017-04-23 NOTE — Telephone Encounter (Signed)
Patient notified and will try taking it in the morning

## 2017-04-23 NOTE — Telephone Encounter (Signed)
-----   Message from Leone Haven, MD sent at 04/20/2017  5:29 PM EST ----- Given the elevation in his blood sugar we should check an A1c.  Please order this with a diagnosis of elevated glucose.  You can get him scheduled for a lab visit.  Thanks.

## 2017-04-25 ENCOUNTER — Ambulatory Visit (INDEPENDENT_AMBULATORY_CARE_PROVIDER_SITE_OTHER): Payer: Medicare Other

## 2017-04-25 DIAGNOSIS — C679 Malignant neoplasm of bladder, unspecified: Secondary | ICD-10-CM | POA: Diagnosis not present

## 2017-04-25 LAB — MICROSCOPIC EXAMINATION: Epithelial Cells (non renal): NONE SEEN /hpf (ref 0–10)

## 2017-04-25 LAB — URINALYSIS, COMPLETE
Bilirubin, UA: NEGATIVE
GLUCOSE, UA: NEGATIVE
KETONES UA: NEGATIVE
NITRITE UA: NEGATIVE
Specific Gravity, UA: 1.02 (ref 1.005–1.030)
UUROB: 0.2 mg/dL (ref 0.2–1.0)
pH, UA: 6 (ref 5.0–7.5)

## 2017-04-25 MED ORDER — BCG LIVE 50 MG IS SUSR
3.2400 mL | Freq: Once | INTRAVESICAL | Status: AC
Start: 2017-04-25 — End: 2017-04-25
  Administered 2017-04-25: 81 mg via INTRAVESICAL

## 2017-04-25 NOTE — Progress Notes (Signed)
BCG Bladder Instillation  BCG # 5  Due to Bladder Cancer patient is present today for a BCG treatment. Patient was cleaned and prepped in a sterile fashion with betadine and lidocaine 2% jelly was instilled into the urethra.  A 14FR catheter was inserted, urine return was noted 44ml, urine was yellow in color.  58ml of reconstituted BCG was instilled into the bladder. The catheter was then removed. Patient tolerated well, no complications were noted  Preformed by: Toniann Fail, LPN   Follow up/ Additional notes: 1 week

## 2017-04-28 LAB — CULTURE, URINE COMPREHENSIVE

## 2017-05-01 NOTE — Progress Notes (Signed)
   05/02/2017 10:37 PM   Aaron Quinn 06/06/1931 [id number]  Referring provider: Leone Haven, MD [address] [place], [place] [address]  No chief complaint on file.   HPI: 84 yo WM who presents today to start an induction course of BCG.  This will be #6/6.    Background history 83 y.o male presents for a postop follow-up.  He is status post TURBT on 11/27.  Clinic cystoscopy was remarkable for a right lateral wall bladder tumor however intraoperatively he was found to have bladder tumor at the trigone and a papillary tumor at the anterior aspect of the bladder near the left bladder neck.  His Foley catheter was removed on 02/16/2017 and he states he voided well for 36 hours then had acute onset of urinary retention necessitating an ED visit where the Foley catheter was replaced.  At the time of his urinary retention he denied gross hematuria.  Pathology:  DIAGNOSIS:  A. BLADDER TUMOR, RIGHT LATERAL WALL; TRANSURETHRAL RESECTION:  - NON-INVASIVE PAPILLARY UROTHELIAL CARCINOMA, LOW-GRADE.  - MUSCULARIS PROPRIA IS PRESENT AND NOT INVOLVED.   B. 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  - MUSCULARIS PROPRIA IS PRESENT AND NOT INVOLVED.   C. BLADDER TUMOR, LEFT BLADDER NECK; TRANSURETHRAL RESECTION:  - NON-INVASIVE PAPILLARY UROTHELIAL CARCINOMA, HIGH GRADE.  - MUSCULARIS PROPRIA IS PRESENT AND NOT INVOLVED.   D. PAPILLARY TUMOR, PROSTATE; TRANSURETHRAL RESECTION:  - PROSTATIC URETHRAL POLYP.  - NEGATIVE FOR MALIGNANCY.   Comment:  Additional deeper sections were reviewed on part B, the trigone tumor.  It shows both papillary fragments and areas of inverted growth pattern  with expansile nests of tumor cells. The stromal-epithelial interface  has a smooth contour. No definite infiltration of the lamina propria is  identified, and there is no invasion of  the muscularis propria.   The histologic features for A, B, and C are consistent with tumor  classification Ta.     Laboratory Data: Urinalysis 6-10 WBC's.  3-10 RBC's.   See Epic.   Assessment & Plan:    1.  Urothelial carcinoma  - Ta urothelial carcinoma-high risk.    - Reviewed BCG treatment course, possible side effects including BCG sepsis, bladder irritation, worsening of her urinary symptoms  - # 6 of 6 BCG installed today  - Patient was instructed to pour bleach down his/her toilet for the next 6 hours  -  Instructed to call the office if she should experience fevers greater than 102, chills/rigors, onset of a new cough, night sweats or further bladder spasms or inability to urinate   - RTC in 3 months for cystoscopy  - Surveillance protocol also discussed today including cystoscopy every 3 months for at least 2 years and then spread out thereafter  - Urinalysis, Complete  - bcg vaccine injection 81 mg; Instill 3.24 mLs (81 mg total) into the bladder once.  - lidocaine (XYLOCAINE) 2 % jelly 1 application; Place 1 application into the urethra once.    [name], PA-C  [facility] [address] [place], [place] [address] [phone]

## 2017-05-02 ENCOUNTER — Ambulatory Visit (INDEPENDENT_AMBULATORY_CARE_PROVIDER_SITE_OTHER): Payer: Medicare Other | Admitting: Urology

## 2017-05-02 DIAGNOSIS — C679 Malignant neoplasm of bladder, unspecified: Secondary | ICD-10-CM

## 2017-05-02 LAB — URINALYSIS, COMPLETE
BILIRUBIN UA: NEGATIVE
Glucose, UA: NEGATIVE
Ketones, UA: NEGATIVE
Nitrite, UA: NEGATIVE
PH UA: 6.5 (ref 5.0–7.5)
Specific Gravity, UA: 1.025 (ref 1.005–1.030)
Urobilinogen, Ur: 0.2 mg/dL (ref 0.2–1.0)

## 2017-05-02 LAB — MICROSCOPIC EXAMINATION: EPITHELIAL CELLS (NON RENAL): NONE SEEN /HPF (ref 0–10)

## 2017-05-02 MED ORDER — BCG LIVE 50 MG IS SUSR
3.2400 mL | Freq: Once | INTRAVESICAL | Status: AC
  Administered 2017-05-02: 81 mg via INTRAVESICAL

## 2017-05-02 NOTE — Progress Notes (Signed)
BCG Bladder Instillation  BCG # 6  Due to Bladder Cancer patient is present today for a BCG treatment. Patient was cleaned and prepped in a sterile fashion with betadine and lidocaine 2% jelly was instilled into the urethra.  A 14FR catheter was inserted, urine return was noted 39ml, urine was yellow in color.  28ml of reconstituted BCG was instilled into the bladder. The catheter was then removed. Patient tolerated well, no complications were noted  Preformed by: Toniann Fail, LPN   Follow up/ Additional notes: 3 months for cysto

## 2017-05-14 ENCOUNTER — Telehealth: Payer: Self-pay | Admitting: Pharmacist

## 2017-05-14 NOTE — Patient Outreach (Signed)
Opened under incorrect context - Charting for [employer], Dr. Ellen Henri patient.  Prior authorization approved for zolpidem tartrate 10 mg #90 for 90 day supply, effective 04/14/2017 - 06/11/2017; case ID = [id number]. Patient will be notified via letter by [employer].  Carlean Jews, Pharm.D., BCPS PGY2 Ambulatory Care Pharmacy Resident Phone: [phone]

## 2017-05-17 DIAGNOSIS — G8929 Other chronic pain: Secondary | ICD-10-CM | POA: Insufficient documentation

## 2017-05-17 DIAGNOSIS — M545 Low back pain: Secondary | ICD-10-CM

## 2017-05-24 ENCOUNTER — Other Ambulatory Visit: Payer: Self-pay | Admitting: Family Medicine

## 2017-06-06 ENCOUNTER — Telehealth: Payer: Self-pay

## 2017-06-06 NOTE — Telephone Encounter (Signed)
Copied from [place] [id number]. Topic: Appointment Scheduling - Scheduling Inquiry for Clinic >> Jun 06, 2017 12:05 PM Aurelio Brash B wrote: Reason for CRM: PT has a rash on left leg and he has had about 3 months he wants to see only Dr Caryl Bis   I get hard stops  and cant not get him scheduled anytime soon he says after 4pm today is best time to contact him

## 2017-06-06 NOTE — Telephone Encounter (Signed)
Left message to return call, ok for pec to speak with patient and schedule in a 0315 slot

## 2017-06-07 ENCOUNTER — Telehealth: Payer: Self-pay

## 2017-06-07 NOTE — Telephone Encounter (Signed)
Patient is returning Nelson call. He is wanting to be seen by only Dr. Caryl Bis.Janett Billow left a message for him yesterday 06-06-17 for an opening at 3:15 but he missed her call he is calling back today to see if he can be seen tomorrow.

## 2017-06-08 NOTE — Telephone Encounter (Signed)
See other telephone encounter.

## 2017-06-08 NOTE — Telephone Encounter (Signed)
Patient is scheduled   

## 2017-06-11 ENCOUNTER — Ambulatory Visit: Payer: Medicare Other | Admitting: Family Medicine

## 2017-06-11 ENCOUNTER — Encounter: Payer: Self-pay | Admitting: Family Medicine

## 2017-06-11 ENCOUNTER — Other Ambulatory Visit: Payer: Self-pay | Admitting: Family Medicine

## 2017-06-11 VITALS — BP 140/88 | HR 63 | Temp 98.7°F | Wt 177.8 lb

## 2017-06-11 DIAGNOSIS — R21 Rash and other nonspecific skin eruption: Secondary | ICD-10-CM

## 2017-06-11 LAB — COMPREHENSIVE METABOLIC PANEL
ALT: 12 U/L (ref 0–53)
AST: 17 U/L (ref 0–37)
Albumin: 4.2 g/dL (ref 3.5–5.2)
Alkaline Phosphatase: 98 U/L (ref 39–117)
BUN: 23 mg/dL (ref 6–23)
CHLORIDE: 104 meq/L (ref 96–112)
CO2: 30 mEq/L (ref 19–32)
Calcium: 10.8 mg/dL — ABNORMAL HIGH (ref 8.4–10.5)
Creatinine, Ser: 1.03 mg/dL (ref 0.40–1.50)
GFR: 73.19 mL/min (ref >60.00)
Glucose, Bld: 120 mg/dL — ABNORMAL HIGH (ref 70–99)
POTASSIUM: 4.5 meq/L (ref 3.5–5.1)
SODIUM: 140 meq/L (ref 135–145)
Total Bilirubin: 0.5 mg/dL (ref 0.2–1.2)
Total Protein: 7.2 g/dL (ref 6.0–8.3)

## 2017-06-11 LAB — CBC
HEMATOCRIT: 42.5 % (ref 39.0–52.0)
HEMOGLOBIN: 14.8 g/dL (ref 13.0–17.0)
MCHC: 34.7 g/dL (ref 30.0–36.0)
MCV: 89.7 fl (ref 78.0–100.0)
Platelets: 216 10*3/uL (ref 150.0–400.0)
RBC: 4.74 Mil/uL (ref 4.22–5.81)
RDW: 14 % (ref 11.5–15.5)
WBC: 5.6 10*3/uL (ref 4.0–10.5)

## 2017-06-11 LAB — SEDIMENTATION RATE: Sed Rate: 2 mm/hr (ref 0–20)

## 2017-06-11 MED ORDER — TRIAMCINOLONE ACETONIDE 0.5 % EX OINT: 1.0000 "application " | TOPICAL_OINTMENT | Freq: Two times a day (BID) | CUTANEOUS | 0 refills | Status: DC

## 2017-06-11 NOTE — Assessment & Plan Note (Signed)
Concerning for vasculitic rash.  We will check lab work.  Will refer back to dermatology.  He can trial a higher potency triamcinolone that will be prescribed.  If not beneficial after several days he will discontinue this.  He is given return precautions.

## 2017-06-11 NOTE — Patient Instructions (Addendum)
Nice to see you. We will check some lab work and contact you with the results. We will get you to see Dr. Nehemiah Massed for the rash. Please try the topical ointment to see if that is beneficial.

## 2017-06-11 NOTE — Progress Notes (Signed)
  [name], MD Phone: [phone]  Aaron Quinn is a 84 y.o. male who presents today for same-day visit.  Patient notes rash for the last 6-8 weeks.  Started out as a small spot on his left ankle.  Has developed more spots on his left ankle and left lower leg.  Itched to start with.  Notes part of it was angry and red last week though that has improved.  No spreading redness.  No new medications.  No new soaps or detergents.  No fevers.  No contacts with this.  Has developed a few scabs on his left hand dorsal aspect as well over the last 2-3 months.  No excessive bleeding.  He has tried triamcinolone 0.1% cream and hydrocortisone 2.5% cream on it with little benefit.  He does have a dermatologist.  Social History   Tobacco Use  Smoking Status Former Smoker  . Packs/day: 0.50  . Years: 20.00  . Pack years: 10.00  . Types: Cigarettes  . Last attempt to quit: 03/20/1982  . Years since quitting: 35.2  Smokeless Tobacco Never Used     ROS see history of present illness  Objective  Physical Exam Vitals:   06/11/17 1133 06/11/17 1159  BP: (!) 158/86 140/88  Pulse: 63   Temp: 98.7 F (37.1 C)   SpO2: 95%     BP Readings from Last 3 Encounters:  06/11/17 140/88  04/20/17 (!) 142/70  04/20/17 (!) 142/70   Wt Readings from Last 3 Encounters:  06/11/17 177 lb 12.8 oz (80.6 kg)  04/20/17 176 lb (79.8 kg)  04/20/17 176 lb (79.8 kg)    Physical Exam  Constitutional: No distress.  Cardiovascular: Normal rate, regular rhythm and normal heart sounds.  Pulmonary/Chest: Effort normal.  Musculoskeletal: He exhibits no edema.  Neurological: He is alert. Gait normal.  Skin: Skin is warm and dry. He is not diaphoretic.  Scattered vasculitic purpuric appearing rash on his left ankle with excoriated area laterally, several smaller similar purpuric rash up to mid shin, nontender, 2+ radial and PT pulses on the left  A couple of scabs noted on the dorsum of his left  hand   Assessment/Plan: Please see individual problem list.  Rash Concerning for vasculitic rash.  We will check lab work.  Will refer back to dermatology.  He can trial a higher potency triamcinolone that will be prescribed.  If not beneficial after several days he will discontinue this.  He is given return precautions.   Orders Placed This Encounter  Procedures  . Comp Met (CMET)  . CBC  . Sedimentation rate  . Ambulatory referral to Dermatology    Referral Priority:   Routine    Referral Type:   Consultation    Referral Reason:   Specialty Services Required    Requested Specialty:   Dermatology    Number of Visits Requested:   1    Meds ordered this encounter  Medications  . triamcinolone ointment (KENALOG) 0.5 %    Sig: Apply 1 application topically 2 (two) times daily.    Dispense:  30 g    Refill:  0     [name], MD [place]

## 2017-06-12 ENCOUNTER — Ambulatory Visit: Payer: Medicare Other | Attending: Family Medicine

## 2017-06-12 ENCOUNTER — Telehealth: Payer: Self-pay | Admitting: Family Medicine

## 2017-06-12 DIAGNOSIS — M79604 Pain in right leg: Secondary | ICD-10-CM | POA: Insufficient documentation

## 2017-06-12 DIAGNOSIS — G8929 Other chronic pain: Secondary | ICD-10-CM | POA: Diagnosis present

## 2017-06-12 DIAGNOSIS — M6281 Muscle weakness (generalized): Secondary | ICD-10-CM | POA: Insufficient documentation

## 2017-06-12 DIAGNOSIS — M5441 Lumbago with sciatica, right side: Secondary | ICD-10-CM | POA: Insufficient documentation

## 2017-06-12 NOTE — Telephone Encounter (Signed)
Patient notified of lab results- appointment scheduled.  Lab not in basket

## 2017-06-12 NOTE — Progress Notes (Signed)
Left voicemail for patient to call office back. Ok for [facility] to give results.

## 2017-06-13 NOTE — Therapy (Signed)
[facility] [address], [place], [address] Phone: [phone]   Fax:  [phone]  Physical Therapy Evaluation  Patient Details  Name: Aaron Quinn MRN: [id number] Date of Birth: 1931-08-30 Referring Provider: Loree Fee Meeler   Encounter Date: 06/12/2017  PT End of Session - 06/13/17 0930    Visit Number  1    Number of Visits  12    Date for PT Re-Evaluation  07/24/17    PT Start Time  1330    PT Stop Time  1430    PT Time Calculation (min)  60 min    Activity Tolerance  Patient tolerated treatment well    Behavior During Therapy  [facility] for tasks assessed/performed       Past Medical History:  Diagnosis Date  . Arthritis   . Complication of anesthesia    slow to get rid of anesthesia, over a couple of days.  . Diverticulosis   . Fibromyalgia 1995  . Glaucoma    pt denies  . History of kidney stones   . Hyperlipidemia   . Hypertension   . Positive TB test   . Tuberculosis 1963   pt's son hyad TB when he was 28 years old, pt did not contract TB  . Tubulovillous adenoma of colon     Past Surgical History:  Procedure Laterality Date  . APPENDECTOMY  2005  . CATARACT EXTRACTION, BILATERAL  2012  . COLONOSCOPY W/ BIOPSIES    . CYST EXCISION Right 1950's   thigh  . HEMORRHOID BANDING    . KNEE ARTHROSCOPY Left 2014  . KNEE CARTILAGE SURGERY Left 1951  . NASAL SINUS SURGERY  1950's  . TONSILLECTOMY  1940  . TRANSURETHRAL RESECTION OF BLADDER TUMOR N/A 02/13/2017   Procedure: TRANSURETHRAL RESECTION OF BLADDER TUMOR (TURBT);  Surgeon: Abbie Sons, MD;  Location: [facility] ORS;  Service: Urology;  Laterality: N/A;  45 minutes needed  . VASECTOMY  1972    There were no vitals filed for this visit.   Subjective Assessment - 06/12/17 1350    Subjective  Patient reports LBP with increased  R LE painwith all physical activity and states he is unable to perform a lot of physical activity  secondary to increased pain. Patient states difficulty with performing walking over uneven surfaces such as his yard and reports he has unable to perform yard work, heavy household chores such as vacuuming, cooking/cleaning, and performing prolonged activities. Patient reports increased sleep difficulties as well with inability to sleep on his R side, which is the side he prefers to sleep. Patient reports pain is worse throughout the day most notably with activity. Patient also reports he is able to mow his lawn but requires the use of a seated lawn mower which also aggravates his pain.     Pertinent History  8 months of LBP with sciatica.     Currently in Pain?  Yes    Pain Score  2  worst:  8/10 ; best: 2/10     Pain Location  Leg    Pain Orientation  Right    Pain Descriptors / Indicators  Aching    Pain Type  Chronic pain    Pain Onset  More than a month ago    Pain Frequency  Intermittent         [facility] PT Assessment - 06/12/17 1345      Assessment   Medical Diagnosis  LBP  Referring Provider  Whitney Meeler    Onset Date/Surgical Date  08/18/16    Hand Dominance  Left    Next MD Visit  unknown    Prior Therapy  no      Balance Screen   Has the patient fallen in the past 6 months  No    Has the patient had a decrease in activity level because of a fear of falling?   Yes    Is the patient reluctant to leave their home because of a fear of falling?   No      Prior Function   Level of Independence  Independent    Vocation  Retired    [employer]  N/A    Leisure  Traveling, golf, tennis, going to plays       Cognition   Overall Cognitive Status  Within Functional Limits for tasks assessed      Observation/Other Assessments   Observations  Patient demonstrates difficulty with standing from sit to stand and requires standing rest break to maintain balance.      Sensation   Light Touch  Appears Intact      Functional Tests   Functional tests  Sit to Stand       Sit to Stand   Comments  Patient demonstrates several attempts to perform standing      Posture/Postural Control   Posture Comments  Increased weight onto L LE with amb and standing      ROM / Strength   AROM / PROM / Strength  AROM;Strength      AROM   Overall AROM Comments  Knee and ankle AROM WNL    AROM Assessment Site  Lumbar;Hip    Right/Left Hip  Right;Left    Right Hip Extension  2    Right Hip Flexion  90    Right Hip External Rotation   20    Right Hip Internal Rotation   30    Right Hip ABduction  25    Right Hip ADduction  30    Left Hip Extension  10    Left Hip Flexion  110    Left Hip External Rotation   30    Left Hip Internal Rotation   30    Left Hip ABduction  30    Left Hip ADduction  30    Lumbar Flexion  WNL    Lumbar Extension  25% limited    Lumbar - Right Side Bend  20% limited    Lumbar - Left Side Bend  20% limited    Lumbar - Right Rotation  WNL    Lumbar - Left Rotation  WNL      Strength   Strength Assessment Site  Hip;Lumbar    Right/Left Hip  Left;Right    Right Hip Flexion  4/5    Right Hip Extension  3/5    Right Hip External Rotation   3+/5    Right Hip Internal Rotation  3+/5    Right Hip ABduction  4-/5    Right Hip ADduction  4+/5    Left Hip Flexion  4+/5    Left Hip Extension  4/5    Left Hip External Rotation  4/5    Left Hip Internal Rotation  4/5    Left Hip ABduction  4/5    Left Hip ADduction  4/5    Lumbar Flexion  4+/5    Lumbar Extension  4-/5      Palpation  Spinal mobility  Hypomobility all lumbar segments    SI assessment   No increased pain with palpation    Palpation comment  Increased TTP along glute max on the affected side just inferior to SIJ      Special Tests    Special Tests  Lumbar    Lumbar Tests  Prone Knee Bend Test;Straight Leg Raise      Straight Leg Raise   Findings  Positive    Side   Right    Comment  Increased reproduction of symtoms at end range      Ambulation/Gait   Gait Comments   Decreased stride length B, greater time on L throughout gait cycle, decreased heel strike on L side        Objective measurements completed on examination: See above findings.    TREATMENT: Therapeutic Exercise: SLR with dorsiflexion/plantarflexion -- for nerve glides x20 Bridges performed in hooklying -- x 20 Prone press ups in prone -- x 10  Patient demonstrate no increase in pain at end of the session    PT Education - 06/13/17 0929    Education provided  Yes    Education Details  HEP: cobra prone press ups, ambulationg with increased heel strike.     Person(s) Educated  Patient    Methods  Explanation;Demonstration    Comprehension  Verbalized understanding;Returned demonstration          PT Long Term Goals - 06/13/17 1246      PT LONG TERM GOAL #1   Title  Patient will be independent with HEP to continue benefits of therapy after discharge.    Baseline  Dependent with exercises    Time  6    Period  Weeks    Status  New    Target Date  07/24/17      PT LONG TERM GOAL #2   Title  Patient will have a worst pain of 2/10 along the right leg and the lumbar spine.    Baseline  6/10 R LE pain    Time  6    Period  Weeks    Status  New    Target Date  07/24/17      PT LONG TERM GOAL #3   Title  Patient will be able to walk for 23min without increase in pain to indicate functional improvement with performing yard work around his property    Baseline  increased pain after ambulating for 5 min    Time  6    Period  Weeks    Status  New    Target Date  07/24/17             Plan - 06/13/17 1239    Clinical Impression Statement  Patient is 84 yo male right handed presenting with LBP with radiaiting pain down his R LE. Patient demonstrates increased lumbar dysfunction most likely with disc involvement and lumbar dysfunction. Patient also demonstrates poor hip weakness and decreased coordination. Patient will benefit from further skilled therapy to return to  prior level of function.     History and Personal Factors relevant to plan of care:  8 months of LBP    Clinical Presentation  Evolving    Clinical Presentation due to:  Pain is not improving    Clinical Decision Making  Moderate    Rehab Potential  Fair    Clinical Impairments Affecting Rehab Potential  (-) 8 months of pain chronicity (+) highly motivated    PT Frequency  2x / week    PT Duration  6 weeks    PT Treatment/Interventions  Balance training;Neuromuscular re-education;Cryotherapy;Electrical Stimulation;Ultrasound;Traction;Moist Heat;Iontophoresis 4mg /ml Dexamethasone;Gait training;Stair training;Functional mobility training;Therapeutic activities;Therapeutic exercise;Patient/family education;Manual techniques;Passive range of motion;Dry needling;Taping;Vestibular    PT Next Visit Plan  Progress strengthening and coordination    PT Home Exercise Plan  See education section    Consulted and Agree with Plan of Care  Patient       Patient will benefit from skilled therapeutic intervention in order to improve the following deficits and impairments:  Pain, Increased fascial restricitons, Impaired sensation, Abnormal gait, Decreased coordination, Decreased mobility, Increased muscle spasms, Decreased activity tolerance, Decreased endurance, Decreased range of motion, Decreased strength, Decreased balance  Visit Diagnosis: Pain in right leg - Plan: PT plan of care cert/re-cert  Muscle weakness (generalized) - Plan: PT plan of care cert/re-cert  Chronic right-sided low back pain with right-sided sciatica - Plan: PT plan of care cert/re-cert     Problem List Patient Active Problem List   Diagnosis Date Noted  . Rash 06/11/2017  . Bruising 04/20/2017  . Urothelial carcinoma of bladder ([place]) 02/22/2017  . Sciatica 01/17/2017  . Hematuria 12/20/2016  . Prediabetes 11/02/2016  . Neuropathy 01/07/2016  . Elevated glucose 10/07/2015  . Allergic rhinitis 10/07/2015  . Insomnia  10/07/2015  . Annual physical exam 04/09/2015  . Arthritis 03/10/2015  . Gout 03/10/2015  . H/O varicella 03/10/2015  . HLD (hyperlipidemia) 03/10/2015  . Fibromyalgia 03/09/2015  . Lightheadedness 03/09/2015  . Memory deficit 03/09/2015  . Essential hypertension 03/09/2015  . Hemorrhoids, internal, with bleeding 08/12/2014    Blythe Stanford 06/13/2017, 4:36 PM  [facility] [address], [place], [address] Phone: [phone]   Fax:  [phone]  Name: Aaron Quinn MRN: [id number] Date of Birth: 01/18/1932

## 2017-06-14 ENCOUNTER — Other Ambulatory Visit: Payer: Medicare Other

## 2017-06-14 ENCOUNTER — Ambulatory Visit: Payer: Medicare Other

## 2017-06-18 ENCOUNTER — Ambulatory Visit: Payer: Medicare Other | Attending: Family Medicine

## 2017-06-19 ENCOUNTER — Ambulatory Visit: Payer: Medicare Other

## 2017-06-20 ENCOUNTER — Encounter: Payer: Self-pay | Admitting: Urology

## 2017-06-20 ENCOUNTER — Ambulatory Visit (INDEPENDENT_AMBULATORY_CARE_PROVIDER_SITE_OTHER): Payer: Medicare Other | Admitting: Urology

## 2017-06-20 ENCOUNTER — Ambulatory Visit: Payer: Medicare Other

## 2017-06-20 VITALS — BP 159/77 | HR 65 | Resp 16 | Ht 70.0 in | Wt 175.3 lb

## 2017-06-20 DIAGNOSIS — C679 Malignant neoplasm of bladder, unspecified: Secondary | ICD-10-CM | POA: Diagnosis not present

## 2017-06-20 LAB — URINALYSIS, COMPLETE
Bilirubin, UA: NEGATIVE
Glucose, UA: NEGATIVE
KETONES UA: NEGATIVE
NITRITE UA: NEGATIVE
PH UA: 7 (ref 5.0–7.5)
Protein, UA: NEGATIVE
SPEC GRAV UA: 1.02 (ref 1.005–1.030)
Urobilinogen, Ur: 0.2 mg/dL (ref 0.2–1.0)

## 2017-06-20 LAB — MICROSCOPIC EXAMINATION
Bacteria, UA: NONE SEEN
EPITHELIAL CELLS (NON RENAL): NONE SEEN /HPF (ref 0–10)

## 2017-06-20 MED ORDER — LIDOCAINE HCL 2 % EX GEL
1.0000 "application " | Freq: Once | CUTANEOUS | Status: AC
  Administered 2017-06-20: 1 via URETHRAL

## 2017-06-20 MED ORDER — CIPROFLOXACIN HCL 500 MG PO TABS
500.0000 mg | ORAL_TABLET | Freq: Once | ORAL | Status: AC
  Administered 2017-06-20: 500 mg via ORAL

## 2017-06-23 ENCOUNTER — Encounter: Payer: Self-pay | Admitting: Urology

## 2017-06-23 NOTE — Progress Notes (Signed)
   06/23/17  CC: No chief complaint on file.   HPI: 84 year old male status post TURBT late November 2018 for high-grade urothelial carcinoma the bladder.  He completed a 6-week course of induction BCG on 05/02/2017.  He tolerated the treatments well and has no complaints today.  Blood pressure (!) 159/77, pulse 65, resp. rate 16, height 5\' 10"  (1.778 m), weight 175 lb 4.8 oz (79.5 kg), SpO2 98 %. NED. A&Ox3.   No respiratory distress   Abd soft, NT, ND Normal phallus with bilateral descended testicles  Cystoscopy Procedure Note  Patient identification was confirmed, informed consent was obtained, and patient was prepped using Betadine solution.  Lidocaine jelly was administered per urethral meatus.    Preoperative abx where received prior to procedure.     Pre-Procedure: - Inspection reveals a normal caliber ureteral meatus.  Procedure: The flexible cystoscope was introduced without difficulty - No urethral strictures/lesions are present. - Moderate lateral lobe enlargement prostate  - Moderate bladder neck elevation  - Bilateral ureteral orifices identified - Bladder mucosa  reveals no ulcers, tumors.  Minimal erythema at prior resection sites.  No evidence of recurrent tumor. - No bladder stones - No trabeculation  Retroflexion shows no significant abnormalities   Post-Procedure: - Patient tolerated the procedure well  Assessment/ Plan: No evidence of recurrent tumor.  There are postoperative/post BCG G changes present.  A urine cytology was sent and if no definite evidence of malignancy he will return in 3 months for a surveillance cystoscopy.

## 2017-06-26 ENCOUNTER — Other Ambulatory Visit: Payer: Self-pay | Admitting: Urology

## 2017-06-26 ENCOUNTER — Ambulatory Visit: Payer: Medicare Other

## 2017-06-26 ENCOUNTER — Encounter: Payer: Self-pay | Admitting: Family Medicine

## 2017-06-26 ENCOUNTER — Telehealth: Payer: Self-pay | Admitting: Family Medicine

## 2017-06-26 NOTE — Telephone Encounter (Signed)
-----   Message from Abbie Sons, MD sent at 06/26/2017 12:18 PM EDT ----- Urine cytology negative for malignant cells.  Follow-up for cystoscopy as scheduled

## 2017-06-26 NOTE — Telephone Encounter (Signed)
Letter sent.

## 2017-07-04 ENCOUNTER — Other Ambulatory Visit (INDEPENDENT_AMBULATORY_CARE_PROVIDER_SITE_OTHER): Payer: Medicare Other

## 2017-07-05 LAB — PTH, INTACT AND CALCIUM
Calcium: 10.4 mg/dL — ABNORMAL HIGH (ref 8.6–10.3)
PTH: 70 pg/mL — ABNORMAL HIGH (ref 14–64)

## 2017-07-07 ENCOUNTER — Other Ambulatory Visit: Payer: Self-pay | Admitting: Family Medicine

## 2017-07-07 DIAGNOSIS — E213 Hyperparathyroidism, unspecified: Secondary | ICD-10-CM

## 2017-07-12 ENCOUNTER — Telehealth: Payer: Self-pay

## 2017-07-12 NOTE — Telephone Encounter (Signed)
Copied from [place]. Topic: Referral - Status >> Jul 12, 2017 11:15 AM Oliver Pila B wrote: Reason for CRM: pt called to check status of referral for the endocrinologist

## 2017-07-12 NOTE — Telephone Encounter (Signed)
Referral is under review at [facility]

## 2017-08-15 ENCOUNTER — Encounter: Payer: Self-pay | Admitting: Family Medicine

## 2017-08-15 ENCOUNTER — Ambulatory Visit: Payer: Medicare Other | Admitting: Family Medicine

## 2017-08-15 DIAGNOSIS — F5101 Primary insomnia: Secondary | ICD-10-CM | POA: Diagnosis not present

## 2017-08-15 DIAGNOSIS — M5431 Sciatica, right side: Secondary | ICD-10-CM

## 2017-08-15 DIAGNOSIS — I10 Essential (primary) hypertension: Secondary | ICD-10-CM

## 2017-08-15 DIAGNOSIS — G629 Polyneuropathy, unspecified: Secondary | ICD-10-CM

## 2017-08-15 MED ORDER — ZOLPIDEM TARTRATE 10 MG PO TABS: 10.0000 mg | ORAL_TABLET | Freq: Every evening | ORAL | 0 refills | Status: DC | PRN

## 2017-08-15 NOTE — Assessment & Plan Note (Signed)
Adequately controlled.  Continue Ambien. 

## 2017-08-15 NOTE — Patient Instructions (Signed)
Nice to see you. Please continue to monitor your blood pressure.  If it goes up above 150/90 consistently please let us know.  Please make sure you take your blood pressure medications daily. I have refilled your Ambien. Please discuss your neuropathy as well as your Lyrica and gabapentin with your neurologist when you see them in follow-up.

## 2017-08-15 NOTE — Assessment & Plan Note (Signed)
Chronic issue.  He will continue his current medication regimen.  He is seeing a Restaurant manager, fast food.

## 2017-08-15 NOTE — Assessment & Plan Note (Signed)
Elevated today though the patient has not taken his blood pressure medication.  I reinforced that he needs to take his blood pressure medication daily.  He will continue to monitor at home and if it elevates again at home he should let us know.

## 2017-08-15 NOTE — Assessment & Plan Note (Signed)
Chronic issue.  He reports he is taking Lyrica and gabapentin.  He sees his neurologist soon and he has been advised to discuss these medications and his neuropathy with them.

## 2017-08-15 NOTE — Progress Notes (Signed)
  [name], MD Phone: [phone]  Aaron Quinn is a 84 y.o. male who presents today for f/u.  CC: htn, neuropathy, insomnia  HYPERTENSION  Disease Monitoring  Home BP Monitoring 120s-141/70s-80s Chest pain- no    Dyspnea- no Medications  Compliance-  Taking amlodipine, HCTZ - though did not take today.  Edema- no  Insomnia: This is stable on Ambien.  This does not make him drowsy. He sleeps well with this. He gets 8 to 9 hours of sleep he does not drink alcohol with this.  Patient has chronic low back pain with sciatica-like symptoms down the right leg.  He also has bilateral neuropathy in his feet up to his ankles that is described as stinging and burning.  He has started going to a chiropractor which has helped some.  He is doing physical therapy through their office.  He reports he is taking gabapentin and Lyrica though cannot tell me specific doses.  He follows with neurology.  No incontinence issues.    Social History   Tobacco Use  Smoking Status Former Smoker  . Packs/day: 0.50  . Years: 20.00  . Pack years: 10.00  . Types: Cigarettes  . Last attempt to quit: 03/20/1982  . Years since quitting: 35.4  Smokeless Tobacco Never Used     ROS see history of present illness  Objective  Physical Exam Vitals:   08/15/17 1524  BP: (!) 170/90  Pulse: (!) 58  Temp: 98.2 F (36.8 C)  SpO2: 95%    BP Readings from Last 3 Encounters:  08/15/17 (!) 170/90  06/20/17 (!) 159/77  06/11/17 140/88   Wt Readings from Last 3 Encounters:  08/15/17 174 lb 9.6 oz (79.2 kg)  06/20/17 175 lb 4.8 oz (79.5 kg)  06/11/17 177 lb 12.8 oz (80.6 kg)    Physical Exam  Constitutional: No distress.  Cardiovascular: Normal rate, regular rhythm and normal heart sounds.  Pulmonary/Chest: Effort normal and breath sounds normal.  Musculoskeletal: He exhibits no edema.  No midline spine tenderness, no midline spine step-off, no muscular back tenderness  Neurological: He is  alert.  5/5 strength bilateral quads, hamstrings, plantar flexion, and dorsiflexion, sensation light touch intact bilateral lower extremities  Skin: Skin is warm and dry. He is not diaphoretic.     Assessment/Plan: Please see individual problem list.  Essential hypertension Elevated today though the patient has not taken his blood pressure medication.  I reinforced that he needs to take his blood pressure medication daily.  He will continue to monitor at home and if it elevates again at home he should let us know.  Neuropathy (HCC) Chronic issue.  He reports he is taking Lyrica and gabapentin.  He sees his neurologist soon and he has been advised to discuss these medications and his neuropathy with them.  Sciatica Chronic issue.  He will continue his current medication regimen.  He is seeing a Restaurant manager, fast food.  Insomnia Adequately controlled.  Continue Ambien.   No orders of the defined types were placed in this encounter.   No orders of the defined types were placed in this encounter.    [name], MD [place]

## 2017-09-13 ENCOUNTER — Telehealth: Payer: Self-pay | Admitting: Urology

## 2017-09-13 NOTE — Telephone Encounter (Signed)
Pt has appt for cysto w/[facility] on 7/10, but is currently passing blood in urine and wants to know if he can come in and leave urine sample.  Please give pt a call.

## 2017-09-14 NOTE — Telephone Encounter (Signed)
LMOM for patient to call and schedule a nurse urine if he is still having problems.

## 2017-09-26 ENCOUNTER — Encounter: Payer: Self-pay | Admitting: Urology

## 2017-09-26 ENCOUNTER — Ambulatory Visit (INDEPENDENT_AMBULATORY_CARE_PROVIDER_SITE_OTHER): Payer: Medicare Other | Admitting: Urology

## 2017-09-26 VITALS — BP 148/78 | HR 72 | Ht 70.0 in | Wt 172.0 lb

## 2017-09-26 DIAGNOSIS — N329 Bladder disorder, unspecified: Secondary | ICD-10-CM

## 2017-09-26 DIAGNOSIS — R3129 Other microscopic hematuria: Secondary | ICD-10-CM

## 2017-09-26 DIAGNOSIS — C679 Malignant neoplasm of bladder, unspecified: Secondary | ICD-10-CM

## 2017-09-26 LAB — URINALYSIS, COMPLETE
Bilirubin, UA: NEGATIVE
GLUCOSE, UA: NEGATIVE
Ketones, UA: NEGATIVE
Nitrite, UA: NEGATIVE
Specific Gravity, UA: 1.02 (ref 1.005–1.030)
Urobilinogen, Ur: 0.2 mg/dL (ref 0.2–1.0)
pH, UA: 6 (ref 5.0–7.5)

## 2017-09-26 LAB — MICROSCOPIC EXAMINATION
Epithelial Cells (non renal): NONE SEEN /hpf (ref 0–10)
WBC, UA: >30 /hpf — ABNORMAL HIGH (ref 0–5)

## 2017-09-26 MED ORDER — LIDOCAINE HCL URETHRAL/MUCOSAL 2 % EX GEL
1.0000 "application " | Freq: Once | CUTANEOUS | Status: AC
  Administered 2017-10-23: 1 via URETHRAL

## 2017-09-26 MED ORDER — CIPROFLOXACIN HCL 500 MG PO TABS
500.0000 mg | ORAL_TABLET | Freq: Once | ORAL | Status: AC
  Administered 2017-10-23: 500 mg via ORAL

## 2017-09-26 MED ORDER — SULFAMETHOXAZOLE-TRIMETHOPRIM 800-160 MG PO TABS: 1.0000 | ORAL_TABLET | Freq: Two times a day (BID) | ORAL | 0 refills | Status: AC

## 2017-09-26 NOTE — Progress Notes (Signed)
Aaron Quinn was scheduled for surveillance cystoscopy today.  A urine has significant pyuria and he is complaining of dysuria.  A urine culture was ordered.  Antibiotic Rx will be sent to his pharmacy.  His cystoscopy will be rescheduled.

## 2017-09-29 LAB — CULTURE, URINE COMPREHENSIVE

## 2017-10-01 ENCOUNTER — Telehealth: Payer: Self-pay

## 2017-10-01 NOTE — Telephone Encounter (Signed)
-----   Message from Abbie Sons, MD sent at 09/30/2017 10:38 AM EDT ----- Urine culture was negative for infection.  It looks like his cystoscopy was rescheduled for 8/6.  Okay to do earlier if appointments available.

## 2017-10-01 NOTE — Telephone Encounter (Signed)
Patient notified , no sooner apt available will keep 8-6 apt

## 2017-10-09 DIAGNOSIS — E213 Hyperparathyroidism, unspecified: Secondary | ICD-10-CM | POA: Insufficient documentation

## 2017-10-15 ENCOUNTER — Telehealth: Payer: Self-pay | Admitting: Urology

## 2017-10-15 ENCOUNTER — Ambulatory Visit: Payer: Medicare Other

## 2017-10-15 DIAGNOSIS — R300 Dysuria: Secondary | ICD-10-CM

## 2017-10-15 LAB — URINALYSIS, COMPLETE
BILIRUBIN UA: NEGATIVE
Glucose, UA: NEGATIVE
Ketones, UA: NEGATIVE
Nitrite, UA: NEGATIVE
PH UA: 7 (ref 5.0–7.5)
PROTEIN UA: NEGATIVE
SPEC GRAV UA: 1.02 (ref 1.005–1.030)
UUROB: 0.2 mg/dL (ref 0.2–1.0)

## 2017-10-15 LAB — MICROSCOPIC EXAMINATION

## 2017-10-15 NOTE — Progress Notes (Signed)
Aaron Quinn is present in office today for a nurse visit. He was in office previously for cysto, which was postponed due to urine looking suspicious for infection. He was started on abx, however culture came back negative for infection. He asked to be seen today for continued dysuria and frequency. His urine again looked suspicious for infection at this time. Another culture was performed to rule out infection. He is scheduled for a cysto on Aug 6 with Dr. Bernardo Heater.

## 2017-10-15 NOTE — Telephone Encounter (Signed)
Patient called today and stated that he had finished his abx but was still burning and is scheduled for another cysto on 10-23-17 per Dr. Bernardo Heater he needs to come iin to do another UA and culture to check for infection. He is coming in today on the nurse schedule @ 11:00  Sharyn Lull

## 2017-10-16 DIAGNOSIS — M1611 Unilateral primary osteoarthritis, right hip: Secondary | ICD-10-CM | POA: Insufficient documentation

## 2017-10-16 DIAGNOSIS — M25551 Pain in right hip: Secondary | ICD-10-CM | POA: Insufficient documentation

## 2017-10-18 DIAGNOSIS — E213 Hyperparathyroidism, unspecified: Secondary | ICD-10-CM

## 2017-10-18 HISTORY — DX: Hyperparathyroidism, unspecified: E21.3

## 2017-10-18 LAB — CULTURE, URINE COMPREHENSIVE

## 2017-10-19 ENCOUNTER — Telehealth: Payer: Self-pay

## 2017-10-19 NOTE — Telephone Encounter (Signed)
Pt informed

## 2017-10-19 NOTE — Telephone Encounter (Signed)
-----   Message from Abbie Sons, MD sent at 10/18/2017  3:47 PM EDT ----- Urine culture had no significant growth.  Proceed with cystoscopy as scheduled.

## 2017-10-23 ENCOUNTER — Ambulatory Visit (INDEPENDENT_AMBULATORY_CARE_PROVIDER_SITE_OTHER): Payer: Medicare Other | Admitting: Urology

## 2017-10-23 DIAGNOSIS — N3289 Other specified disorders of bladder: Secondary | ICD-10-CM

## 2017-10-23 DIAGNOSIS — C679 Malignant neoplasm of bladder, unspecified: Secondary | ICD-10-CM | POA: Diagnosis not present

## 2017-10-23 LAB — URINALYSIS, COMPLETE
Bilirubin, UA: NEGATIVE
Glucose, UA: NEGATIVE
KETONES UA: NEGATIVE
Nitrite, UA: NEGATIVE
Protein, UA: NEGATIVE
SPEC GRAV UA: 1.015 (ref 1.005–1.030)
Urobilinogen, Ur: 0.2 mg/dL (ref 0.2–1.0)
pH, UA: 6.5 (ref 5.0–7.5)

## 2017-10-23 LAB — MICROSCOPIC EXAMINATION: Epithelial Cells (non renal): NONE SEEN /hpf (ref 0–10)

## 2017-10-24 MED ORDER — TAMSULOSIN HCL 0.4 MG PO CAPS: 0.4000 mg | ORAL_CAPSULE | Freq: Every day | ORAL | 0 refills | Status: DC

## 2017-10-24 NOTE — Progress Notes (Signed)
   10/24/17  CC: No chief complaint on file.   HPI: 84 year old male status post TURBT late November 2018 for high-grade urothelial carcinoma the bladder.  He completed a 6-week course of induction BCG on 05/02/2017 He has had pyuria with negative urine culture and complains of mild dysuria, hesitancy and decreased stream  Cystoscopy Procedure Note  Patient identification was confirmed, informed consent was obtained, and patient was prepped using Betadine solution.  Lidocaine jelly was administered per urethral meatus.    Preoperative abx where received prior to procedure.    Pre-Procedure: - Inspection reveals a normal caliber ureteral meatus.  Procedure: The flexible cystoscope was introduced without difficulty - No urethral strictures/lesions are present. - Moderate lateral lobe enlargement prostate  - Mild elevation bladder neck - Bilateral ureteral orifices identified - Bladder mucosa  reveals no ulcers, tumors.  Scattered posterior wall erythema - No bladder stones   Retroflexion shows no intravesical median lobe   Post-Procedure: - Patient tolerated the procedure well  Assessment/ Plan: History of high-grade urothelial carcinoma with bladder erythema and pyuria.  Urine cytology was sent.  Discussed potential recommendation of bladder biopsy however will await the cytology results.  Rx tamsulosin was sent to pharmacy.

## 2017-10-29 ENCOUNTER — Other Ambulatory Visit: Payer: Self-pay | Admitting: Urology

## 2017-10-31 ENCOUNTER — Other Ambulatory Visit: Payer: Self-pay

## 2017-10-31 ENCOUNTER — Ambulatory Visit
Admission: RE | Admit: 2017-10-31 | Discharge: 2017-10-31 | Disposition: A | Discharge status: Home / Self Care | Payer: Medicare Other | Source: Ambulatory Visit | Attending: Surgery | Admitting: Surgery

## 2017-10-31 ENCOUNTER — Encounter
Admission: RE | Admit: 2017-10-31 | Discharge: 2017-10-31 | Disposition: A | Discharge status: Home / Self Care | Payer: Medicare Other | Source: Ambulatory Visit | Attending: Surgery | Admitting: Surgery

## 2017-10-31 DIAGNOSIS — Z01818 Encounter for other preprocedural examination: Secondary | ICD-10-CM | POA: Diagnosis not present

## 2017-10-31 DIAGNOSIS — Z8709 Personal history of other diseases of the respiratory system: Secondary | ICD-10-CM

## 2017-10-31 DIAGNOSIS — I10 Essential (primary) hypertension: Secondary | ICD-10-CM | POA: Insufficient documentation

## 2017-10-31 DIAGNOSIS — J439 Emphysema, unspecified: Secondary | ICD-10-CM | POA: Diagnosis not present

## 2017-10-31 DIAGNOSIS — R001 Bradycardia, unspecified: Secondary | ICD-10-CM | POA: Diagnosis not present

## 2017-10-31 DIAGNOSIS — I700 Atherosclerosis of aorta: Secondary | ICD-10-CM | POA: Insufficient documentation

## 2017-10-31 HISTORY — DX: Malignant (primary) neoplasm, unspecified: C80.1

## 2017-10-31 HISTORY — DX: Personal history of malignant neoplasm of bladder: Z85.51

## 2017-10-31 LAB — URINALYSIS, ROUTINE W REFLEX MICROSCOPIC
Bacteria, UA: NONE SEEN
Bilirubin Urine: NEGATIVE
Glucose, UA: NEGATIVE mg/dL
Ketones, ur: NEGATIVE mg/dL
Nitrite: NEGATIVE
Protein, ur: 30 mg/dL — AB
Specific Gravity, Urine: 1.014 (ref 1.005–1.030)
WBC, UA: >50 WBC/hpf — ABNORMAL HIGH (ref 0–5)
pH: 6 (ref 5.0–8.0)

## 2017-10-31 LAB — CBC
HCT: 41.2 % (ref 40.0–52.0)
Hemoglobin: 14.4 g/dL (ref 13.0–18.0)
MCH: 31.5 pg (ref 26.0–34.0)
MCHC: 34.8 g/dL (ref 32.0–36.0)
MCV: 90.4 fL (ref 80.0–100.0)
Platelets: 177 10*3/uL (ref 150–440)
RBC: 4.56 MIL/uL (ref 4.40–5.90)
RDW: 13.8 % (ref 11.5–14.5)
WBC: 5 10*3/uL (ref 3.8–10.6)

## 2017-10-31 LAB — BASIC METABOLIC PANEL
Anion gap: 8 (ref 5–15)
BUN: 17 mg/dL (ref 8–23)
CO2: 26 mmol/L (ref 22–32)
Calcium: 10.2 mg/dL (ref 8.9–10.3)
Chloride: 106 mmol/L (ref 98–111)
Creatinine, Ser: 1.1 mg/dL (ref 0.61–1.24)
GFR calc Af Amer: >60 mL/min (ref >60)
GFR calc non Af Amer: 60 mL/min — ABNORMAL LOW (ref >60)
Glucose, Bld: 101 mg/dL — ABNORMAL HIGH (ref 70–99)
Potassium: 3.9 mmol/L (ref 3.5–5.1)
Sodium: 140 mmol/L (ref 135–145)

## 2017-10-31 LAB — TYPE AND SCREEN
ABO/RH(D): A POS
ANTIBODY SCREEN: NEGATIVE

## 2017-10-31 LAB — SURGICAL PCR SCREEN
MRSA, PCR: NEGATIVE
STAPHYLOCOCCUS AUREUS: NEGATIVE

## 2017-10-31 LAB — PROTIME-INR
INR: 0.97
PROTHROMBIN TIME: 12.8 s (ref 11.4–15.2)

## 2017-10-31 NOTE — Patient Instructions (Signed)
Your procedure is scheduled on: Tuesday, November 06, 2017 Report to Day Surgery on the 2nd floor of the [facility]. To find out your arrival time, please call [phone] between 1PM - 3PM on: Monday, November 05, 2017  REMEMBER: Instructions that are not followed completely may result in serious medical risk, up to and including death; or upon the discretion of your surgeon and anesthesiologist your surgery may need to be rescheduled.  Do not eat food after midnight the night before surgery.  No gum chewing, lozengers or hard candies.  You may however, drink CLEAR liquids up to 2 hours before you are scheduled to arrive for your surgery. Do not drink anything within 2 hours of the start of your surgery.  Clear liquids include: - water  - apple juice without pulp - gatorade - black coffee or tea (Do NOT add milk or creamers to the coffee or tea) Do NOT drink anything that is not on this list.  No Alcohol for 24 hours before or after surgery.  No Smoking including e-cigarettes for 24 hours prior to surgery.  No chewable tobacco products for at least 6 hours prior to surgery.  No nicotine patches on the day of surgery.  On the morning of surgery brush your teeth with toothpaste and water, you may rinse your mouth with mouthwash if you wish. Do not swallow any toothpaste or mouthwash.  Notify your doctor if there is any change in your medical condition (cold, fever, infection).  Do not wear jewelry, make-up, hairpins, clips or nail polish.  Do not wear lotions, powders, or perfumes. You may wear deodorant.  Do not shave 48 hours prior to surgery. Men may shave face and neck.  Contacts and dentures may not be worn into surgery.  Do not bring valuables to the hospital, including drivers license, insurance or credit cards.  [facility] is not responsible for any belongings or valuables.   TAKE THESE MEDICATIONS THE MORNING OF SURGERY:  1.  AMLODIPINE 2.  CITALOPRAM 3.   GABAPENTIN 4.  LYRICA  Use CHG Soap as directed on instruction sheet.  NOW!  Stop Anti-inflammatories (NSAIDS) such as Advil, Aleve, Ibuprofen, Motrin, Naproxen, Naprosyn and Aspirin based products such as Excedrin, Goodys Powder, BC Powder. (May take Tylenol or Acetaminophen if needed.)  NOW!  Stop ANY OVER THE COUNTER supplements until after surgery.  Wear comfortable clothing (specific to your surgery type) to the hospital.  Plan for stool softeners for home use.  If you are being admitted to the hospital overnight, leave your suitcase in the car. After surgery it may be brought to your room.  Please call [phone] if you have any questions about these instructions.

## 2017-11-01 ENCOUNTER — Encounter: Payer: Self-pay | Admitting: *Deleted

## 2017-11-01 NOTE — Pre-Procedure Instructions (Addendum)
EKG COMPARED TO PREVIOUS AND NOTED FUSION COMPLEXES. EKG WITH REQUEST FOR CLEARANCE CALLED AND FAXED TO DR Caryl Bis. ALSO FAXED FYI TO DR Carlyle Lipa

## 2017-11-05 ENCOUNTER — Other Ambulatory Visit: Payer: Self-pay

## 2017-11-05 ENCOUNTER — Telehealth: Payer: Self-pay | Admitting: Family Medicine

## 2017-11-05 DIAGNOSIS — F5101 Primary insomnia: Secondary | ICD-10-CM

## 2017-11-05 DIAGNOSIS — R9431 Abnormal electrocardiogram [ECG] [EKG]: Secondary | ICD-10-CM

## 2017-11-05 MED ORDER — ZOLPIDEM TARTRATE 10 MG PO TABS
10.0000 mg | ORAL_TABLET | Freq: Every evening | ORAL | 0 refills | Status: DC | PRN
Start: 2017-11-15 — End: 2018-02-25

## 2017-11-05 NOTE — Telephone Encounter (Signed)
Copied from [place] [phone]. Topic: Quick Communication - See Telephone Encounter >> Nov 05, 2017  4:42 PM Blase Mess A wrote: CRM for notification. See Telephone encounter for: 11/05/17. Patient called letting Dr Caryl Bis that his surgery was canceled for tomorrrow because there was an abnormal EKG.  [place] sent over paperwork for Dr. Ellen Henri review for clearance. The patient would like Dr. Caryl Bis to advise.

## 2017-11-05 NOTE — Telephone Encounter (Signed)
Last OV 08/15/17 last filled 08/15/17 90 0rf

## 2017-11-05 NOTE — Pre-Procedure Instructions (Addendum)
Contacted Dr. Tharon Aquas' office to see what they determined regarding medical clearance. Patient has not been seen and has an appointment on 11/16/2017 for a 3 month follow up . [place] will send a note to his nurse to determine what the status is at this time. There is a message on file in their office for the need for medical, possible cardiac clearance.  I spoke with Zinc at Dr. Lang Snow office to inform her of this scenario.  She was going to look into this, call the patient and then get back to [place] as to what all may know.  Sherlie Ban returned the call that this case was cancelled because no clearance was obtained.  Patient will try to get the clearance at his appt at the end of the month.

## 2017-11-06 ENCOUNTER — Inpatient Hospital Stay: Admission: RE | Admit: 2017-11-06 | Payer: Medicare Other | Source: Ambulatory Visit | Admitting: Surgery

## 2017-11-06 ENCOUNTER — Telehealth: Payer: Self-pay

## 2017-11-06 ENCOUNTER — Encounter: Admission: RE | Payer: Self-pay | Source: Ambulatory Visit

## 2017-11-06 SURGERY — ARTHROPLASTY, HIP, TOTAL,POSTERIOR APPROACH
Anesthesia: Choice | Laterality: Right

## 2017-11-06 NOTE — Telephone Encounter (Signed)
Copied from [place] [phone]. Topic: General - Other >> Nov 05, 2017  3:55 PM Waldemar Dickens, Valda Favia wrote: [facility] is calling in wanting to know if surgery clearance form was received.

## 2017-11-06 NOTE — Telephone Encounter (Signed)
Please advise 

## 2017-11-06 NOTE — Telephone Encounter (Signed)
Patient states he was supposed to have hip surgery today but due to his abnormal ekg they cancelled the surgery. He would like to know what to do to resolve this so he can move forward with surgery. He states if he has to see cardiology then he is ok with referral.

## 2017-11-06 NOTE — Telephone Encounter (Signed)
See other message

## 2017-11-06 NOTE — Telephone Encounter (Signed)
Patient notified

## 2017-11-06 NOTE — Telephone Encounter (Signed)
He is scheduled tomorrow 8/21 @ 3 with Dr. Clayborn Bigness. He is aware

## 2017-11-06 NOTE — Telephone Encounter (Signed)
Copied from [place] [phone]. Topic: General - Other >> Nov 05, 2017  3:55 PM Waldemar Dickens, Valda Favia wrote: [facility] is calling in wanting to know if surgery clearance form was received.

## 2017-11-06 NOTE — Telephone Encounter (Signed)
He needs to see cardiology based on how his EKG was read. I will place a referral and send to [facility] to get him scheduled with whomever can see him sooner. Patient is due to have surgery and needs to be seen prior to having surgery.

## 2017-11-06 NOTE — Telephone Encounter (Signed)
Thanks for getting him scheduled.

## 2017-11-08 ENCOUNTER — Encounter: Payer: Self-pay | Admitting: *Deleted

## 2017-11-12 MED ORDER — CEFAZOLIN SODIUM-DEXTROSE 2-4 GM/100ML-% IV SOLN
2.0000 g | Freq: Once | INTRAVENOUS | Status: AC
  Administered 2017-11-13: 2 g via INTRAVENOUS
  Filled 2017-11-12: qty 100

## 2017-11-13 ENCOUNTER — Inpatient Hospital Stay
Admission: RE | Admit: 2017-11-13 | Discharge: 2017-11-16 | DRG: 470 | Disposition: A | Discharge status: Home / Self Care | Payer: Medicare Other | Source: Ambulatory Visit | Attending: Surgery | Admitting: Surgery

## 2017-11-13 ENCOUNTER — Inpatient Hospital Stay: Payer: Medicare Other | Admitting: Certified Registered Nurse Anesthetist

## 2017-11-13 ENCOUNTER — Encounter
Admission: RE | Disposition: A | Discharge status: Home / Self Care | Payer: Self-pay | Source: Ambulatory Visit | Attending: Surgery

## 2017-11-13 ENCOUNTER — Inpatient Hospital Stay: Payer: Medicare Other

## 2017-11-13 ENCOUNTER — Other Ambulatory Visit: Payer: Self-pay

## 2017-11-13 DIAGNOSIS — G629 Polyneuropathy, unspecified: Secondary | ICD-10-CM | POA: Diagnosis present

## 2017-11-13 DIAGNOSIS — M1611 Unilateral primary osteoarthritis, right hip: Principal | ICD-10-CM | POA: Diagnosis present

## 2017-11-13 DIAGNOSIS — R0902 Hypoxemia: Secondary | ICD-10-CM | POA: Diagnosis present

## 2017-11-13 DIAGNOSIS — Z9841 Cataract extraction status, right eye: Secondary | ICD-10-CM | POA: Diagnosis not present

## 2017-11-13 DIAGNOSIS — Z9842 Cataract extraction status, left eye: Secondary | ICD-10-CM

## 2017-11-13 DIAGNOSIS — Z833 Family history of diabetes mellitus: Secondary | ICD-10-CM | POA: Diagnosis not present

## 2017-11-13 DIAGNOSIS — J9811 Atelectasis: Secondary | ICD-10-CM | POA: Diagnosis present

## 2017-11-13 DIAGNOSIS — Z66 Do not resuscitate: Secondary | ICD-10-CM | POA: Diagnosis present

## 2017-11-13 DIAGNOSIS — E213 Hyperparathyroidism, unspecified: Secondary | ICD-10-CM | POA: Diagnosis present

## 2017-11-13 DIAGNOSIS — W19XXXA Unspecified fall, initial encounter: Secondary | ICD-10-CM

## 2017-11-13 DIAGNOSIS — Z8261 Family history of arthritis: Secondary | ICD-10-CM | POA: Diagnosis not present

## 2017-11-13 DIAGNOSIS — R55 Syncope and collapse: Secondary | ICD-10-CM | POA: Diagnosis not present

## 2017-11-13 DIAGNOSIS — E785 Hyperlipidemia, unspecified: Secondary | ICD-10-CM | POA: Diagnosis present

## 2017-11-13 DIAGNOSIS — N390 Urinary tract infection, site not specified: Secondary | ICD-10-CM | POA: Diagnosis not present

## 2017-11-13 DIAGNOSIS — K219 Gastro-esophageal reflux disease without esophagitis: Secondary | ICD-10-CM | POA: Diagnosis present

## 2017-11-13 DIAGNOSIS — T83511A Infection and inflammatory reaction due to indwelling urethral catheter, initial encounter: Secondary | ICD-10-CM | POA: Diagnosis not present

## 2017-11-13 DIAGNOSIS — F0390 Unspecified dementia without behavioral disturbance: Secondary | ICD-10-CM | POA: Diagnosis present

## 2017-11-13 DIAGNOSIS — H409 Unspecified glaucoma: Secondary | ICD-10-CM | POA: Diagnosis present

## 2017-11-13 DIAGNOSIS — Z87891 Personal history of nicotine dependence: Secondary | ICD-10-CM

## 2017-11-13 DIAGNOSIS — I639 Cerebral infarction, unspecified: Secondary | ICD-10-CM

## 2017-11-13 DIAGNOSIS — I10 Essential (primary) hypertension: Secondary | ICD-10-CM | POA: Diagnosis present

## 2017-11-13 DIAGNOSIS — Z8249 Family history of ischemic heart disease and other diseases of the circulatory system: Secondary | ICD-10-CM | POA: Diagnosis not present

## 2017-11-13 DIAGNOSIS — Z87442 Personal history of urinary calculi: Secondary | ICD-10-CM

## 2017-11-13 DIAGNOSIS — Z8551 Personal history of malignant neoplasm of bladder: Secondary | ICD-10-CM

## 2017-11-13 DIAGNOSIS — R339 Retention of urine, unspecified: Secondary | ICD-10-CM | POA: Diagnosis not present

## 2017-11-13 DIAGNOSIS — M797 Fibromyalgia: Secondary | ICD-10-CM | POA: Diagnosis present

## 2017-11-13 DIAGNOSIS — Z818 Family history of other mental and behavioral disorders: Secondary | ICD-10-CM | POA: Diagnosis not present

## 2017-11-13 DIAGNOSIS — Z800 Family history of malignant neoplasm of digestive organs: Secondary | ICD-10-CM

## 2017-11-13 DIAGNOSIS — Z96641 Presence of right artificial hip joint: Secondary | ICD-10-CM

## 2017-11-13 DIAGNOSIS — I340 Nonrheumatic mitral (valve) insufficiency: Secondary | ICD-10-CM | POA: Diagnosis not present

## 2017-11-13 HISTORY — DX: Presence of right artificial hip joint: Z96.641

## 2017-11-13 HISTORY — PX: TOTAL HIP ARTHROPLASTY: SHX124

## 2017-11-13 HISTORY — DX: Hyperparathyroidism, unspecified: E21.3

## 2017-11-13 LAB — GLUCOSE, CAPILLARY: Glucose-Capillary: 119 mg/dL — ABNORMAL HIGH (ref 70–99)

## 2017-11-13 LAB — TROPONIN I

## 2017-11-13 LAB — ABO/RH: ABO/RH(D): A POS

## 2017-11-13 SURGERY — ARTHROPLASTY, HIP, TOTAL,POSTERIOR APPROACH
Anesthesia: General | Site: Hip | Laterality: Right | Wound class: Clean

## 2017-11-13 MED ORDER — TRANEXAMIC ACID 1000 MG/10ML IV SOLN
INTRAVENOUS | Status: DC | PRN
  Administered 2017-11-13: 1000 mg via TOPICAL

## 2017-11-13 MED ORDER — AMLODIPINE BESYLATE 5 MG PO TABS
2.5000 mg | ORAL_TABLET | Freq: Every day | ORAL | Status: DC
  Administered 2017-11-14 – 2017-11-16 (×3): 2.5 mg via ORAL
  Filled 2017-11-13 (×3): qty 1

## 2017-11-13 MED ORDER — CITALOPRAM HYDROBROMIDE 20 MG PO TABS
20.0000 mg | ORAL_TABLET | Freq: Every day | ORAL | Status: DC
  Administered 2017-11-14 – 2017-11-16 (×3): 20 mg via ORAL
  Filled 2017-11-13 (×3): qty 1

## 2017-11-13 MED ORDER — METOCLOPRAMIDE HCL 10 MG PO TABS: 5.0000 mg | ORAL_TABLET | Freq: Three times a day (TID) | ORAL | Status: DC | PRN

## 2017-11-13 MED ORDER — BUPIVACAINE HCL (PF) 0.5 % IJ SOLN
INTRAMUSCULAR | Status: AC
  Filled 2017-11-13: qty 10

## 2017-11-13 MED ORDER — OXYCODONE HCL 5 MG PO TABS
5.0000 mg | ORAL_TABLET | ORAL | Status: DC | PRN
  Administered 2017-11-13: 5 mg via ORAL
  Administered 2017-11-13: 10 mg via ORAL
  Administered 2017-11-13 – 2017-11-14 (×3): 5 mg via ORAL
  Filled 2017-11-13 (×2): qty 2
  Filled 2017-11-13 (×3): qty 1

## 2017-11-13 MED ORDER — SODIUM CHLORIDE 0.9 % IV SOLN
INTRAVENOUS | Status: DC | PRN
  Administered 2017-11-13: 30 ug/min via INTRAVENOUS

## 2017-11-13 MED ORDER — PANTOPRAZOLE SODIUM 40 MG PO TBEC
40.0000 mg | DELAYED_RELEASE_TABLET | Freq: Every day | ORAL | Status: DC
  Administered 2017-11-14 – 2017-11-15 (×2): 40 mg via ORAL
  Filled 2017-11-13 (×4): qty 1

## 2017-11-13 MED ORDER — ONDANSETRON HCL 4 MG/2ML IJ SOLN
4.0000 mg | Freq: Four times a day (QID) | INTRAMUSCULAR | Status: DC | PRN
  Administered 2017-11-13 – 2017-11-14 (×2): 4 mg via INTRAVENOUS
  Filled 2017-11-13 (×2): qty 2

## 2017-11-13 MED ORDER — HYDROMORPHONE HCL 1 MG/ML IJ SOLN: 0.5000 mg | INTRAMUSCULAR | Status: DC | PRN

## 2017-11-13 MED ORDER — FENTANYL CITRATE (PF) 100 MCG/2ML IJ SOLN
INTRAMUSCULAR | Status: DC | PRN
  Administered 2017-11-13 (×2): 50 ug via INTRAVENOUS

## 2017-11-13 MED ORDER — FLUTICASONE PROPIONATE 50 MCG/ACT NA SUSP: 1.0000 | Freq: Every day | NASAL | Status: DC | PRN

## 2017-11-13 MED ORDER — NEOMYCIN-POLYMYXIN B GU 40-200000 IR SOLN
Status: AC
  Filled 2017-11-13: qty 20

## 2017-11-13 MED ORDER — BUPIVACAINE HCL (PF) 0.5 % IJ SOLN
INTRAMUSCULAR | Status: DC | PRN
  Administered 2017-11-13: 3 mL via INTRATHECAL

## 2017-11-13 MED ORDER — BUPIVACAINE-EPINEPHRINE (PF) 0.5% -1:200000 IJ SOLN
INTRAMUSCULAR | Status: DC | PRN
  Administered 2017-11-13: 30 mL via PERINEURAL

## 2017-11-13 MED ORDER — GLYCOPYRROLATE 0.2 MG/ML IJ SOLN
INTRAMUSCULAR | Status: AC
  Filled 2017-11-13: qty 1

## 2017-11-13 MED ORDER — FAMOTIDINE 20 MG PO TABS
ORAL_TABLET | ORAL | Status: AC
  Administered 2017-11-13: 20 mg via ORAL
  Filled 2017-11-13: qty 1

## 2017-11-13 MED ORDER — FLEET ENEMA 7-19 GM/118ML RE ENEM: 1.0000 | ENEMA | Freq: Once | RECTAL | Status: DC | PRN

## 2017-11-13 MED ORDER — NEOMYCIN-POLYMYXIN B GU 40-200000 IR SOLN
Status: DC | PRN
  Administered 2017-11-13: 14 mL

## 2017-11-13 MED ORDER — FENTANYL CITRATE (PF) 100 MCG/2ML IJ SOLN
INTRAMUSCULAR | Status: AC
  Filled 2017-11-13: qty 2

## 2017-11-13 MED ORDER — LACTATED RINGERS IV SOLN
INTRAVENOUS | Status: DC
  Administered 2017-11-13 (×2): via INTRAVENOUS

## 2017-11-13 MED ORDER — CEFAZOLIN SODIUM-DEXTROSE 2-4 GM/100ML-% IV SOLN
2.0000 g | Freq: Four times a day (QID) | INTRAVENOUS | Status: AC
  Administered 2017-11-13 – 2017-11-14 (×3): 2 g via INTRAVENOUS
  Filled 2017-11-13 (×3): qty 100

## 2017-11-13 MED ORDER — DOCUSATE SODIUM 100 MG PO CAPS
100.0000 mg | ORAL_CAPSULE | Freq: Two times a day (BID) | ORAL | Status: DC
  Administered 2017-11-13 – 2017-11-14 (×3): 100 mg via ORAL
  Filled 2017-11-13 (×3): qty 1

## 2017-11-13 MED ORDER — ACETAMINOPHEN 500 MG PO TABS
1000.0000 mg | ORAL_TABLET | Freq: Four times a day (QID) | ORAL | Status: AC
  Administered 2017-11-13 – 2017-11-14 (×4): 1000 mg via ORAL
  Filled 2017-11-13 (×4): qty 2

## 2017-11-13 MED ORDER — PROPOFOL 10 MG/ML IV BOLUS
INTRAVENOUS | Status: DC | PRN
  Administered 2017-11-13 (×2): 15 mg via INTRAVENOUS

## 2017-11-13 MED ORDER — DONEPEZIL HCL 5 MG PO TABS
10.0000 mg | ORAL_TABLET | Freq: Every evening | ORAL | Status: DC
  Administered 2017-11-13 – 2017-11-15 (×3): 10 mg via ORAL
  Filled 2017-11-13 (×4): qty 2

## 2017-11-13 MED ORDER — DIPHENHYDRAMINE HCL 12.5 MG/5ML PO ELIX: 12.5000 mg | ORAL_SOLUTION | ORAL | Status: DC | PRN

## 2017-11-13 MED ORDER — KETOCONAZOLE 2 % EX SHAM
1.0000 "application " | MEDICATED_SHAMPOO | CUTANEOUS | Status: DC
  Filled 2017-11-13: qty 120

## 2017-11-13 MED ORDER — GLYCOPYRROLATE 0.2 MG/ML IJ SOLN
INTRAMUSCULAR | Status: DC | PRN
  Administered 2017-11-13: 0.2 mg via INTRAVENOUS

## 2017-11-13 MED ORDER — METOCLOPRAMIDE HCL 5 MG/ML IJ SOLN: 5.0000 mg | Freq: Three times a day (TID) | INTRAMUSCULAR | Status: DC | PRN

## 2017-11-13 MED ORDER — GABAPENTIN 300 MG PO CAPS
300.0000 mg | ORAL_CAPSULE | Freq: Four times a day (QID) | ORAL | Status: DC
  Administered 2017-11-13 – 2017-11-16 (×11): 300 mg via ORAL
  Filled 2017-11-13 (×11): qty 1

## 2017-11-13 MED ORDER — MAGNESIUM HYDROXIDE 400 MG/5ML PO SUSP
30.0000 mL | Freq: Every day | ORAL | Status: DC | PRN
  Administered 2017-11-15: 30 mL via ORAL
  Filled 2017-11-13: qty 30

## 2017-11-13 MED ORDER — ENOXAPARIN SODIUM 40 MG/0.4ML ~~LOC~~ SOLN
40.0000 mg | SUBCUTANEOUS | Status: DC
  Administered 2017-11-14 – 2017-11-16 (×3): 40 mg via SUBCUTANEOUS
  Filled 2017-11-13 (×3): qty 0.4

## 2017-11-13 MED ORDER — FAMOTIDINE 20 MG PO TABS
20.0000 mg | ORAL_TABLET | Freq: Once | ORAL | Status: AC
  Administered 2017-11-13: 20 mg via ORAL

## 2017-11-13 MED ORDER — LIDOCAINE HCL (PF) 2 % IJ SOLN
INTRAMUSCULAR | Status: AC
  Filled 2017-11-13: qty 10

## 2017-11-13 MED ORDER — BUPIVACAINE LIPOSOME 1.3 % IJ SUSP
INTRAMUSCULAR | Status: AC
  Filled 2017-11-13: qty 20

## 2017-11-13 MED ORDER — TRANEXAMIC ACID 1000 MG/10ML IV SOLN
INTRAVENOUS | Status: AC
  Filled 2017-11-13: qty 10

## 2017-11-13 MED ORDER — BUPIVACAINE-EPINEPHRINE (PF) 0.5% -1:200000 IJ SOLN
INTRAMUSCULAR | Status: AC
  Filled 2017-11-13: qty 30

## 2017-11-13 MED ORDER — LORATADINE 10 MG PO TABS
10.0000 mg | ORAL_TABLET | Freq: Every day | ORAL | Status: DC
  Administered 2017-11-14 – 2017-11-16 (×3): 10 mg via ORAL
  Filled 2017-11-13 (×4): qty 1

## 2017-11-13 MED ORDER — ZOLPIDEM TARTRATE 5 MG PO TABS
5.0000 mg | ORAL_TABLET | Freq: Every evening | ORAL | Status: DC | PRN
  Administered 2017-11-13 – 2017-11-15 (×3): 5 mg via ORAL
  Filled 2017-11-13 (×3): qty 1

## 2017-11-13 MED ORDER — PROPOFOL 500 MG/50ML IV EMUL
INTRAVENOUS | Status: AC
  Filled 2017-11-13: qty 50

## 2017-11-13 MED ORDER — TRAMADOL HCL 50 MG PO TABS
50.0000 mg | ORAL_TABLET | Freq: Four times a day (QID) | ORAL | Status: DC | PRN
  Administered 2017-11-13 – 2017-11-14 (×3): 50 mg via ORAL
  Filled 2017-11-13 (×6): qty 1

## 2017-11-13 MED ORDER — PROPOFOL 500 MG/50ML IV EMUL
INTRAVENOUS | Status: DC | PRN
  Administered 2017-11-13: 60 ug/kg/min via INTRAVENOUS

## 2017-11-13 MED ORDER — SODIUM CHLORIDE 0.9 % IV SOLN
INTRAVENOUS | Status: DC | PRN
  Administered 2017-11-13: 60 mL

## 2017-11-13 MED ORDER — PHENYLEPHRINE HCL 10 MG/ML IJ SOLN
INTRAMUSCULAR | Status: AC
  Filled 2017-11-13: qty 1

## 2017-11-13 MED ORDER — PREGABALIN 25 MG PO CAPS
25.0000 mg | ORAL_CAPSULE | Freq: Three times a day (TID) | ORAL | Status: DC
  Administered 2017-11-13 – 2017-11-15 (×7): 25 mg via ORAL
  Filled 2017-11-13 (×8): qty 1

## 2017-11-13 MED ORDER — BISACODYL 10 MG RE SUPP
10.0000 mg | Freq: Every day | RECTAL | Status: DC | PRN
  Administered 2017-11-15: 10 mg via RECTAL
  Filled 2017-11-13: qty 1

## 2017-11-13 MED ORDER — SODIUM CHLORIDE 0.9 % IJ SOLN
INTRAMUSCULAR | Status: AC
  Filled 2017-11-13: qty 50

## 2017-11-13 MED ORDER — SODIUM CHLORIDE 0.9 % IV SOLN
INTRAVENOUS | Status: DC
  Administered 2017-11-13 (×2): via INTRAVENOUS

## 2017-11-13 MED ORDER — ONDANSETRON HCL 4 MG PO TABS: 4.0000 mg | ORAL_TABLET | Freq: Four times a day (QID) | ORAL | Status: DC | PRN

## 2017-11-13 MED ORDER — ACETAMINOPHEN 325 MG PO TABS
325.0000 mg | ORAL_TABLET | Freq: Four times a day (QID) | ORAL | Status: DC | PRN
  Administered 2017-11-15 – 2017-11-16 (×2): 650 mg via ORAL
  Filled 2017-11-13 (×2): qty 2

## 2017-11-13 SURGICAL SUPPLY — 61 items
BLADE SAGITTAL WIDE XTHICK NO (BLADE) ×3 IMPLANT
BLADE SURG SZ20 CARB STEEL (BLADE) ×3 IMPLANT
CANISTER SUCT 1200ML W/VALVE (MISCELLANEOUS) ×3 IMPLANT
CANISTER SUCT 3000ML PPV (MISCELLANEOUS) ×6 IMPLANT
CHLORAPREP W/TINT 26ML (MISCELLANEOUS) ×3 IMPLANT
DRAPE IMP U-DRAPE 54X76 (DRAPES) ×3 IMPLANT
DRAPE INCISE IOBAN 66X60 STRL (DRAPES) ×3 IMPLANT
DRAPE SHEET LG 3/4 BI-LAMINATE (DRAPES) ×3 IMPLANT
DRAPE SURG 17X11 SM STRL (DRAPES) ×6 IMPLANT
DRAPE TABLE BACK 80X90 (DRAPES) ×3 IMPLANT
DRSG OPSITE POSTOP 4X10 (GAUZE/BANDAGES/DRESSINGS) ×2 IMPLANT
DRSG OPSITE POSTOP 4X8 (GAUZE/BANDAGES/DRESSINGS) ×1 IMPLANT
ELECT BLADE 6.5 EXT (BLADE) ×3 IMPLANT
ELECT CAUTERY BLADE 6.4 (BLADE) ×3 IMPLANT
GLOVE BIO SURGEON STRL SZ7.5 (GLOVE) ×12 IMPLANT
GLOVE BIO SURGEON STRL SZ8 (GLOVE) ×12 IMPLANT
GLOVE BIOGEL PI IND STRL 8 (GLOVE) ×1 IMPLANT
GLOVE BIOGEL PI INDICATOR 8 (GLOVE) ×2
GLOVE INDICATOR 8.0 STRL GRN (GLOVE) ×3 IMPLANT
GOWN STRL REUS W/ TWL LRG LVL3 (GOWN DISPOSABLE) ×1 IMPLANT
GOWN STRL REUS W/ TWL XL LVL3 (GOWN DISPOSABLE) ×1 IMPLANT
GOWN STRL REUS W/TWL LRG LVL3 (GOWN DISPOSABLE) ×3
GOWN STRL REUS W/TWL XL LVL3 (GOWN DISPOSABLE) ×3
HANDLE YANKAUER SUCT BULB TIP (MISCELLANEOUS) ×2 IMPLANT
HEAD BIOLOX DELTA 36 P6 NECK (Head) ×2 IMPLANT
HOOD PEEL AWAY FLYTE STAYCOOL (MISCELLANEOUS) ×9 IMPLANT
KIT TURNOVER KIT A (KITS) ×3 IMPLANT
LINER ACET G7 36 SZH (Liner) ×3 IMPLANT
LINER ACTB H 36XHW HIP E1 G7 (Liner) IMPLANT
MAT ABSORB  FLUID 56X50 GRAY (MISCELLANEOUS) ×2
MAT ABSORB FLUID 56X50 GRAY (MISCELLANEOUS) ×1 IMPLANT
NDL FILTER BLUNT 18X1 1/2 (NEEDLE) ×1 IMPLANT
NDL SAFETY ECLIPSE 18X1.5 (NEEDLE) ×2 IMPLANT
NDL SPNL 20GX3.5 QUINCKE YW (NEEDLE) ×1 IMPLANT
NEEDLE FILTER BLUNT 18X 1/2SAF (NEEDLE) ×2
NEEDLE FILTER BLUNT 18X1 1/2 (NEEDLE) ×1 IMPLANT
NEEDLE HYPO 18GX1.5 SHARP (NEEDLE) ×6
NEEDLE SPNL 20GX3.5 QUINCKE YW (NEEDLE) ×3 IMPLANT
PACK HIP PROSTHESIS (MISCELLANEOUS) ×3 IMPLANT
PIN STEINMAN 3/16 (PIN) ×3 IMPLANT
PIN STEINMANN 3/16X9 BAY 6PK (Pin) ×1 IMPLANT
PULSAVAC PLUS IRRIG FAN TIP (DISPOSABLE) ×3
SHELL ACET G7 3H 62 SZH (Shell) ×2 IMPLANT
SOL .9 NS 3000ML IRR  AL (IV SOLUTION) ×2
SOL .9 NS 3000ML IRR AL (IV SOLUTION) ×1
SOL .9 NS 3000ML IRR UROMATIC (IV SOLUTION) ×1 IMPLANT
SPONGE LAP 18X18 RF (DISPOSABLE) ×1 IMPLANT
ST PIN 3/16X9 BAY 6PK (Pin) ×3 IMPLANT
STAPLER SKIN PROX 35W (STAPLE) ×3 IMPLANT
STEM COLLARLESS FULL 14X150MM (Stem) ×2 IMPLANT
SUT TICRON 2-0 30IN 311381 (SUTURE) ×11 IMPLANT
SUT VIC AB 0 CT1 36 (SUTURE) ×3 IMPLANT
SUT VIC AB 1 CT1 36 (SUTURE) ×6 IMPLANT
SUT VIC AB 2-0 CT1 (SUTURE) ×12 IMPLANT
SYR 10ML LL (SYRINGE) ×3 IMPLANT
SYR 20CC LL (SYRINGE) ×3 IMPLANT
SYR 30ML LL (SYRINGE) ×9 IMPLANT
TAPE TRANSPORE STRL 2 31045 (GAUZE/BANDAGES/DRESSINGS) ×3 IMPLANT
TIP FAN IRRIG PULSAVAC PLUS (DISPOSABLE) ×1 IMPLANT
TRAY FOLEY MTR SLVR 16FR STAT (SET/KITS/TRAYS/PACK) ×3 IMPLANT
WATER STERILE IRR 1000ML POUR (IV SOLUTION) ×3 IMPLANT

## 2017-11-13 NOTE — Anesthesia Post-op Follow-up Note (Signed)
Anesthesia QCDR form completed.        

## 2017-11-13 NOTE — NC FL2 (Signed)
[place] LEVEL OF CARE SCREENING TOOL     IDENTIFICATION  Patient Name: LENIX KIDD Birthdate: 09/12/1931 Sex: male Admission Date (Current Location): 11/13/2017  [place] and [place] Number:  Engineering geologist and Address:  [facility], [address], [place], [place] [address]      Provider Number: [id number]  Attending Physician Name and Address:  Corky Mull, MD  Relative Name and Phone Number:       Current Level of Care: Hospital Recommended Level of Care: [place] Prior Approval Number:    Date Approved/Denied:   PASRR Number: [id number])  Discharge Plan: SNF    Current Diagnoses: Patient Active Problem List   Diagnosis Date Noted  . 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 Rash 06/11/2017  . Bruising 04/20/2017  . Urothelial carcinoma of bladder ([place]) 02/22/2017  . Sciatica 01/17/2017  . Hematuria 12/20/2016  . Prediabetes 11/02/2016  . Neuropathy 01/07/2016  . Elevated glucose 10/07/2015  . Allergic rhinitis 10/07/2015  . Insomnia 10/07/2015  . Arthritis 03/10/2015  . Gout 03/10/2015  . H/O varicella 03/10/2015  . HLD (hyperlipidemia) 03/10/2015  . Fibromyalgia 03/09/2015  . Lightheadedness 03/09/2015  . Memory deficit 03/09/2015  . Essential hypertension 03/09/2015  . Hemorrhoids, internal, with bleeding 08/12/2014    Orientation RESPIRATION BLADDER Height & Weight     Self, Time, Situation, Place  Normal Continent Weight: 169 lb 12.8 oz (77 kg) Height:  5\' 8"  (172.7 cm)  BEHAVIORAL SYMPTOMS/MOOD NEUROLOGICAL BOWEL NUTRITION STATUS      Continent Diet(Diet: Heart Healthy/ Carb Modified. )  AMBULATORY STATUS COMMUNICATION OF NEEDS Skin   Extensive Assist Verbally Surgical wounds(Incision: Right Hip. )                       Personal Care Assistance Level of Assistance  Bathing, Feeding, Dressing Bathing Assistance: Limited assistance Feeding assistance:  Independent Dressing Assistance: Limited assistance     Functional Limitations Info  Sight, Hearing, Speech Sight Info: Adequate Hearing Info: Adequate Speech Info: Adequate    SPECIAL CARE FACTORS FREQUENCY  PT (By licensed PT), OT (By licensed OT)     PT Frequency: (5) OT Frequency: (5)            Contractures      Additional Factors Info  Code Status, Allergies Code Status Info: (Full Code. ) Allergies Info: (No Known Allergies. )           Current Medications (11/13/2017):  This is the current hospital active medication list Current Facility-Administered Medications  Medication Dose Route Frequency Provider Last Rate Last Dose  . 0.9 %  sodium chloride infusion   Intravenous Continuous Poggi, Marshall Cork, MD 75 mL/hr at 11/13/17 1249    . acetaminophen (TYLENOL) tablet 1,000 mg  1,000 mg Oral Q6H Poggi, Marshall Cork, MD   1,000 mg at 11/13/17 1235  . [START ON 11/14/2017] acetaminophen (TYLENOL) tablet 325-650 mg  325-650 mg Oral Q6H PRN Poggi, Marshall Cork, MD      . Derrill Memo ON 11/14/2017] amLODipine (NORVASC) tablet 2.5 mg  2.5 mg Oral Daily Poggi, Marshall Cork, MD      . bisacodyl (DULCOLAX) suppository 10 mg  10 mg Rectal Daily PRN Poggi, Marshall Cork, MD      . ceFAZolin (ANCEF) IVPB 2g/100 mL premix  2 g Intravenous Q6H Poggi, Marshall Cork, MD 200 mL/hr at 11/13/17 1402    . [START ON 11/14/2017] citalopram (CELEXA)  tablet 20 mg  20 mg Oral Daily Poggi, Marshall Cork, MD      . diphenhydrAMINE (BENADRYL) 12.5 MG/5ML elixir 12.5-25 mg  12.5-25 mg Oral Q4H PRN Poggi, Marshall Cork, MD      . docusate sodium (COLACE) capsule 100 mg  100 mg Oral BID Poggi, Marshall Cork, MD   100 mg at 11/13/17 1357  . donepezil (ARICEPT) tablet 10 mg  10 mg Oral QPM Poggi, Marshall Cork, MD      . Derrill Memo ON 11/14/2017] enoxaparin (LOVENOX) injection 40 mg  40 mg Subcutaneous Q24H Poggi, Marshall Cork, MD      . fluticasone (FLONASE) 50 MCG/ACT nasal spray 1 spray  1 spray Each Nare Daily PRN Poggi, Marshall Cork, MD      . gabapentin (NEURONTIN) capsule 300 mg   300 mg Oral QID Poggi, Marshall Cork, MD   300 mg at 11/13/17 1357  . HYDROmorphone (DILAUDID) injection 0.5-1 mg  0.5-1 mg Intravenous Q4H PRN Poggi, Marshall Cork, MD      . Derrill Memo ON 11/19/2017] ketoconazole (NIZORAL) 2 % shampoo 1 application  1 application Topical Weekly Poggi, Marshall Cork, MD      . loratadine (CLARITIN) tablet 10 mg  10 mg Oral Daily Poggi, Marshall Cork, MD      . magnesium hydroxide (MILK OF MAGNESIA) suspension 30 mL  30 mL Oral Daily PRN Poggi, Marshall Cork, MD      . metoCLOPramide (REGLAN) tablet 5-10 mg  5-10 mg Oral Q8H PRN Poggi, Marshall Cork, MD       Or  . metoCLOPramide (REGLAN) injection 5-10 mg  5-10 mg Intravenous Q8H PRN Poggi, Marshall Cork, MD      . ondansetron (ZOFRAN) tablet 4 mg  4 mg Oral Q6H PRN Poggi, Marshall Cork, MD       Or  . ondansetron (ZOFRAN) injection 4 mg  4 mg Intravenous Q6H PRN Poggi, Marshall Cork, MD      . oxyCODONE (Oxy IR/ROXICODONE) immediate release tablet 5-10 mg  5-10 mg Oral Q4H PRN Poggi, Marshall Cork, MD   5 mg at 11/13/17 1235  . pantoprazole (PROTONIX) EC tablet 40 mg  40 mg Oral Daily Poggi, Marshall Cork, MD      . pregabalin (LYRICA) capsule 25 mg  25 mg Oral TID Poggi, Marshall Cork, MD      . sodium phosphate (FLEET) 7-19 GM/118ML enema 1 enema  1 enema Rectal Once PRN Poggi, Marshall Cork, MD      . traMADol Veatrice Bourbon) tablet 50 mg  50 mg Oral Q6H PRN Poggi, Marshall Cork, MD         Discharge Medications: Please see discharge summary for a list of discharge medications.  Relevant Imaging Results:  Relevant Lab Results:   Additional Information (SSN: [id number])  Kandas Oliveto, Veronia Beets, LCSW

## 2017-11-13 NOTE — Anesthesia Postprocedure Evaluation (Signed)
Anesthesia Post Note  Patient: Aaron Quinn  Procedure(s) Performed: TOTAL HIP ARTHROPLASTY (Right Hip)  Patient location during evaluation: PACU Anesthesia Type: Spinal Level of consciousness: awake and alert Pain management: pain level controlled Vital Signs Assessment: post-procedure vital signs reviewed and stable Respiratory status: spontaneous breathing, nonlabored ventilation, respiratory function stable and patient connected to nasal cannula oxygen Cardiovascular status: blood pressure returned to baseline and stable Postop Assessment: no apparent nausea or vomiting Anesthetic complications: no     Last Vitals:  Vitals:   11/13/17 1051 11/13/17 1110  BP: 136/68 131/74  Pulse: (!) 45 (!) 53  Resp: 12 14  Temp:  36.5 C  SpO2: 95% 96%    Last Pain:  Vitals:   11/13/17 1110  TempSrc: Oral  PainSc:                  Molli Barrows

## 2017-11-13 NOTE — Progress Notes (Signed)
Family Meeting Note  Advance Directive:yes  Today a meeting took place with the Patient.spouse  The following clinical team members were present during this meeting:MD  The following were discussed:Patient's diagnosis:syncope Right hip replacement , Patient's progosis: > 12 months and Goals for treatment: DNR  Additional follow-up to be provided: DNR  Advanced directives in place and up to date.  Time spent during discussion:16 minutes  Hana Trippett, MD

## 2017-11-13 NOTE — Op Note (Signed)
11/13/2017  10:07 AM  Patient:   Aaron Quinn  Pre-Op Diagnosis:   Degenerative joint disease, right hip.  Post-Op Diagnosis:   Same.  Procedure:   Right total hip arthroplasty.  Surgeon:   Pascal Lux, MD  Assistant:   Cameron Proud, PA-C  Anesthesia:   Spinal  Findings:   As above.  Complications:   None  EBL:   200 cc  Fluids:   1700 cc crystalloid  UOP:   500 cc  TT:   None  Drains:   None  Closure:   Staples  Implants:   Biomet press-fit system with a #14 laterally offset Echo femoral stem, a 62 mm acetabular shell with an E-poly hi-wall liner, and a 36 mm ceramic head with a +6 mm neck.  Brief Clinical Note:   The patient is an 84 year old male with a long history of progressively worsening right hip pain. His symptoms have progressed despite medications, activity modification, etc. His history and examination consistent with advanced degenerative joint disease confirmed by plain radiographs. The patient presents at this time for a right total hip arthroplasty.   Procedure:   The patient was brought into the operating room. After adequate spinal anesthesia was obtained, the patient was repositioned in the left lateral decubitus position and secured using a lateral hip positioner. The right hip and lower extremity were prepped with ChloroPrep solution before being draped sterilely. Preoperative antibiotics were administered. A timeout was performed to verify the appropriate surgical site before a standard posterior approach to the hip was made through an approximately 4-5 inch incision. The incision was carried down through the subcutaneous tissues to expose the gluteal fascia and proximal end of the iliotibial band. These structures were split the length of the incision and the Charnley self-retaining hip retractor placed. The bursal tissues were swept posteriorly to expose the short external rotators. The anterior border of the piriformis tendon was identified and  this plane developed down through the capsule to enter the joint. A flap of tissue was elevated off the posterior aspect of the femoral neck and greater trochanter and retracted posteriorly. This flap included the piriformis tendon, the short external rotators, and the posterior capsule. The soft tissues were elevated off the lateral aspect of the ilium and a large Steinmann pin placed bicortically. With the right leg aligned over the left, a drill bit was placed into the greater trochanter parallel to the Steinmann pin and the distance between these two pins measured in order to optimize leg lengths postoperatively. The drill bit was removed and the hip dislocated. The piriformis fossa was debrided of soft tissues before the intramedullary canal was accessed through this point using a triple step reamer. The canal was reamed sequentially beginning with a #7 tapered reamer and progressing to a #14 tapered reamer. This provided excellent circumferential chatter. Using the appropriate guide, a femoral neck cut was made 10-12 mm above the lesser trochanter. The femoral head was removed.  Attention was directed to the acetabular side. The labrum was debrided circumferentially before the ligamentum teres was removed using a large curette. A line was drawn on the drapes corresponding to the native version of the acetabulum. This line was used as a guide while the acetabulum was reamed sequentially beginning with a 48 mm reamer and progressing to a 61 mm reamer. This provided excellent circumferential chatter. The 61 mm trial acetabulum was positioned and found to fit quite well. Therefore, the 62 mm acetabular shell was selected  and impacted into place with care taken to maintain the appropriate version. The trial high wall liner was inserted.  Attention was redirected to the femoral side. A box osteotome was used to establish version before the canal was broached sequentially beginning with a #11 broach and  progressing to a #14 broach. This was left in place and several trial reductions performed using both a standard and laterally offset neck options, as well as the +0 mm and +6 mm neck lengths. The permanent E-polyethylene hi-wall liner was impacted into the acetabular shell and its locking mechanism verified using a quarter-inch osteotome. Next, the #14 lateral offset femoral stem was impacted into place with care taken to maintain the appropriate version. A repeat trial reduction was performed using the +6 mm neck length. This neck length demonstrated excellent stability both in extension and external rotation as well as with flexion to 90 and internal rotation beyond 70. It also was stable in the position of sleep. In addition, leg lengths appeared to be restored appropriately, both by reassessing the position of the right leg over the left, as well as by measuring the distance between the Steinmann pin and the drill bit. The 36 mm ceramic head with the +6 mm neck adapter construct was put together on the back table before being impacted onto the stem of the femoral component. The Morse taper locking mechanism was verified using manual distraction before the head was relocated and placed through a range of motion with the findings as described above.  The wound was copiously irrigated with bacitracin saline solution via the jet lavage system before the peri-incisional and pericapsular tissues were injected with 30 cc of 0.5% Sensorcaine with epinephrine and 20 cc of Exparel diluted out to 60 cc with normal saline to help with postoperative analgesia. The posterior flap was reapproximated to the posterior aspect of the greater trochanter using #2 Tycron interrupted sutures placed through drill holes. Several additional #2 Tycron interrupted sutures were used to reinforce this layer of closure. The iliotibial band was reapproximated using #1 Vicryl interrupted sutures before the gluteal fascia was closed using a  running #1 Vicryl suture. At this point, 1 g of transexemic acid in 10 cc of normal saline was injected into the joint to help reduce postoperative bleeding. The subcutaneous tissues were closed in several layers using 2-0 Vicryl interrupted sutures before the skin was closed using staples. A sterile occlusive dressing was applied to the wound before the patient was placed into an abduction wedge pillow. The patient was then rolled back into the supine position on his/her hospital bed before being awakened and returned to the recovery room in satisfactory condition after tolerating the procedure well.

## 2017-11-13 NOTE — Progress Notes (Signed)
RN responded to [place] in pt's room. Pt was lying on the floor upon entrance with physical therapist present. Pt was very pale, and responded slowly initially to questions. VSS while on floor was 191/78, HR 53, O2 93 % on RA. Pt has history of SB on previous EKGs. Pt reported nausea, zofran given. Pt was assisted back to bed by nursing and PT staff using hoyer lift. Dr. Roland Rack notified, received order for STAT hospitalist consult, [place] PA came to asses pt's hip. Wife at bedside. Currently, pt is A&O at baseline prior to event, complains of pain to right hip.  [place], Jerry Caras

## 2017-11-13 NOTE — Anesthesia Preprocedure Evaluation (Signed)
Anesthesia Evaluation  Patient identified by MRN, date of birth, ID band Patient awake    Reviewed: Allergy & Precautions, H&P , NPO status , Patient's Chart, lab work & pertinent test results, reviewed documented beta blocker date and time   History of Anesthesia Complications (+) history of anesthetic complications  Airway Mallampati: II   Neck ROM: full    Dental  (+) Poor Dentition, Teeth Intact   Pulmonary neg pulmonary ROS, former smoker,    Pulmonary exam normal        Cardiovascular Exercise Tolerance: Poor hypertension, On Medications negative cardio ROS Normal cardiovascular exam Rhythm:regular Rate:Normal     Neuro/Psych  Neuromuscular disease negative neurological ROS  negative psych ROS   GI/Hepatic negative GI ROS, Neg liver ROS,   Endo/Other  negative endocrine ROS  Renal/GU negative Renal ROS  negative genitourinary   Musculoskeletal   Abdominal   Peds  Hematology negative hematology ROS (+)   Anesthesia Other Findings Past Medical History: No date: Arthritis 2018: Cancer ([place])     Comment:  bladder cancer No date: Complication of anesthesia     Comment:  slow to get rid of anesthesia, over a couple of days. 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 Past Surgical History: 2005: APPENDECTOMY 2012: CATARACT EXTRACTION, BILATERAL No date: COLONOSCOPY W/ BIOPSIES 1950's: CYST EXCISION; Right     Comment:  thigh No date: HEMORRHOID BANDING 2014: KNEE ARTHROSCOPY; Left 1951: KNEE CARTILAGE SURGERY; Left 1950's: NASAL SINUS SURGERY 1940: TONSILLECTOMY 02/13/2017: TRANSURETHRAL RESECTION OF BLADDER TUMOR; N/A     Comment:  Procedure:  TRANSURETHRAL RESECTION OF BLADDER TUMOR               (TURBT);  Surgeon: Abbie Sons, MD;  Location: [facility]               ORS;  Service: Urology;  Laterality: N/A;  45 minutes               needed 1972: VASECTOMY   Reproductive/Obstetrics negative OB ROS                             Anesthesia Physical Anesthesia Plan  ASA: III  Anesthesia Plan: General and Spinal   Post-op Pain Management:    Induction:   PONV Risk Score and Plan:   Airway Management Planned:   Additional Equipment:   Intra-op Plan:   Post-operative Plan:   Informed Consent: I have reviewed the patients History and Physical, chart, labs and discussed the procedure including the risks, benefits and alternatives for the proposed anesthesia with the patient or authorized representative who has indicated his/her understanding and acceptance.   Dental Advisory Given  Plan Discussed with: CRNA  Anesthesia Plan Comments:         Anesthesia Quick Evaluation

## 2017-11-13 NOTE — H&P (Deleted)
[facility] NAME: Aaron Quinn    MR#:  [id number]  DATE OF BIRTH:  04-01-1931  DATE OF ADMISSION:  11/13/2017  PRIMARY CARE PHYSICIAN: Leone Haven, MD   REQUESTING/REFERRING PHYSICIAN: Dr Roland Rack  CHIEF COMPLAINT:    syncope HISTORY OF PRESENT ILLNESS:  Aaron Quinn  is a 84 y.o. male with a known history of hyperparathyroidism, essential hypertension and fibromyalgia who is postoperative day #0 for right total hip arthroplasty.  Hospitalist service was consulted due to syncopal event.  Patient had an uneventful surgical procedure today.  After returning to his room he started working with physical therapy.  The patient went from the bed and took about 2 steps when he apparently passed out.  He leaned forward however he never fell because a therapist was able to catch him and put him back on his bed.  The therapist reports that he was unable to find a carotid pulse and the patient was unresponsive.  The patient finally came to after several seconds.  During this time he was also snoring.  Patient has no cardiac history and this is never happened to him before.  Patient reports no chest pain, dizziness, lightheadedness, fever, chills, nausea or vomiting.  Patient had no pain when working with physical therapy.  His blood pressure systolic was in the 676H after this acute event and his heart rate in the 50s.  PAST MEDICAL HISTORY:   Past Medical History:  Diagnosis Date  . Arthritis   . Cancer ([place]) 2018   bladder cancer  . Complication of anesthesia    slow to get rid of anesthesia, over a couple of days.  . Diverticulosis   . Fibromyalgia 1995  . Glaucoma    pt denies  . History of bladder cancer 2018  . History of kidney stones   . Hyperlipidemia   . Hyperparathyroidism ([place]) 10/2017  . Hypertension   . Positive TB test   . Tubulovillous adenoma of colon     PAST SURGICAL HISTORY:   Past Surgical History:   Procedure Laterality Date  . APPENDECTOMY  2005  . CATARACT EXTRACTION, BILATERAL  2012  . COLONOSCOPY W/ BIOPSIES    . CYST EXCISION Right 1950's   thigh  . HEMORRHOID BANDING    . KNEE ARTHROSCOPY Left 2014  . KNEE CARTILAGE SURGERY Left 1951  . NASAL SINUS SURGERY  1950's  . TONSILLECTOMY  1940  . TRANSURETHRAL RESECTION OF BLADDER TUMOR N/A 02/13/2017   Procedure: TRANSURETHRAL RESECTION OF BLADDER TUMOR (TURBT);  Surgeon: Abbie Sons, MD;  Location: [facility] ORS;  Service: Urology;  Laterality: N/A;  45 minutes needed  . VASECTOMY  1972    SOCIAL HISTORY:   Social History   Tobacco Use  . Smoking status: Former Smoker    Packs/day: 0.50    Years: 20.00    Pack years: 10.00    Types: Cigarettes    Last attempt to quit: 03/20/1982    Years since quitting: 35.6  . Smokeless tobacco: Never Used  Substance Use Topics  . Alcohol use: No    Alcohol/week: 0.0 standard drinks    FAMILY HISTORY:   Family History  Problem Relation Age of Onset  . Colon cancer Maternal Grandmother 15       Died at 40  . Heart disease Father   . Arthritis Unknown        Parent  . Hypertension Unknown  Parent, grandparent  . Diabetes Son   . Alcohol abuse Son   . Depression Son   . Liver cancer Mother   . Rheum arthritis Mother   . Colon polyps Neg Hx   . Kidney disease Neg Hx   . Esophageal cancer Neg Hx   . Gallbladder disease Neg Hx     DRUG ALLERGIES:  No Known Allergies  REVIEW OF SYSTEMS:   Review of Systems  Constitutional: Negative.  Negative for chills, fever and malaise/fatigue.  HENT: Negative.  Negative for ear discharge, ear pain, hearing loss, nosebleeds and sore throat.   Eyes: Negative.  Negative for blurred vision and pain.  Respiratory: Negative.  Negative for cough, hemoptysis, shortness of breath and wheezing.   Cardiovascular: Negative.  Negative for chest pain, palpitations and leg swelling.  Gastrointestinal: Negative.  Negative for abdominal  pain, blood in stool, diarrhea, nausea and vomiting.  Genitourinary: Negative.  Negative for dysuria.  Musculoskeletal: Negative.  Negative for back pain.  Skin: Negative.   Neurological: Negative for dizziness, tremors, speech change, focal weakness, seizures and headaches.  Endo/Heme/Allergies: Negative.  Does not bruise/bleed easily.  Psychiatric/Behavioral: Negative.  Negative for depression, hallucinations and suicidal ideas.    MEDICATIONS AT HOME:   Prior to Admission medications   Medication Sig Start Date End Date Taking? Authorizing Provider  amLODipine (NORVASC) 2.5 MG tablet Take 2.5 mg daily by mouth.    Yes [provider]  citalopram (CELEXA) 20 MG tablet Take 20 mg by mouth daily.   Yes [provider]  donepezil (ARICEPT) 10 MG tablet Take 10 mg by mouth every evening.   Yes [provider]  fluticasone (FLONASE) 50 MCG/ACT nasal spray Place 1 spray into both nostrils daily as needed for allergies.    Yes [provider]  gabapentin (NEURONTIN) 300 MG capsule Take 300 mg by mouth 4 (four) times daily.    Yes [provider]  ketoconazole (NIZORAL) 2 % shampoo Apply 1 application topically once a week.  01/10/15  Yes [provider]  loratadine (CLARITIN) 10 MG tablet Take 10 mg by mouth daily.   Yes [provider]  LYRICA 25 MG capsule Take 25 mg by mouth 3 (three) times daily.  10/10/16  Yes [provider]  zolpidem (AMBIEN) 10 MG tablet Take 1 tablet (10 mg total) by mouth at bedtime as needed for sleep. 11/15/17 02/13/18 Yes Leone Haven, MD      VITAL SIGNS:  Blood pressure (!) 160/77, pulse (!) 55, temperature 98.3 F (36.8 C), temperature source Oral, resp. rate 18, height 5\' 8"  (1.727 m), weight 77 kg, SpO2 96 %.  PHYSICAL EXAMINATION:   Physical Exam  Constitutional: He is oriented to person, place, and time. No distress.  HENT:  Head: Normocephalic.  Eyes: No scleral icterus.   Neck: Normal range of motion. Neck supple. No JVD present. No tracheal deviation present.  Cardiovascular: Normal rate, regular rhythm and normal heart sounds. Exam reveals no gallop and no friction rub.  No murmur heard. Pulmonary/Chest: Effort normal and breath sounds normal. No respiratory distress. He has no wheezes. He has no rales. He exhibits no tenderness.  Abdominal: Soft. Bowel sounds are normal. He exhibits no distension and no mass. There is no tenderness. There is no rebound and no guarding.  Musculoskeletal: Normal range of motion. He exhibits no edema.  Neurological: He is alert and oriented to person, place, and time.  Skin: Skin is warm. No rash noted. No  erythema.  Psychiatric: Judgment normal.      LABORATORY PANEL:   CBC No results for input(s): WBC, HGB, HCT, PLT in the last 168 hours. ------------------------------------------------------------------------------------------------------------------  Chemistries  No results for input(s): NA, K, CL, CO2, GLUCOSE, BUN, CREATININE, CALCIUM, MG, AST, ALT, ALKPHOS, BILITOT in the last 168 hours.  Invalid input(s): GFRCGP ------------------------------------------------------------------------------------------------------------------  Cardiac Enzymes No results for input(s): TROPONINI in the last 168 hours. ------------------------------------------------------------------------------------------------------------------  RADIOLOGY:  Dg Hip Unilat W Or W/o Pelvis 2-3 Views Right  Result Date: 11/13/2017 CLINICAL DATA:  Status post right total hip joint prosthesis placement. EXAM: DG HIP (WITH OR WITHOUT PELVIS) 2-3V RIGHT COMPARISON:  Coronal and sagittal reconstructed images through the the pelvis from an abdominal and pelvic CT scan of January 24, 2017 FINDINGS: The patient has undergone right total hip joint prosthesis placement. Radiographic positioning of the prosthetic components is good. The interface with the  native bone appears normal. No acute native bone abnormality is observed. IMPRESSION: No immediate complication following right total hip joint prosthesis placement. Electronically Signed   By: David  Martinique M.D.   On: 11/13/2017 10:50    EKG:  Pending  IMPRESSION AND PLAN:   84 year old male postoperative day #1 right hip total arthroplasty who had a syncopal event while working with physical therapy.  1.  Syncope: I suspect this is more orthostasis in nature. Start telemetry monitoring. Order echocardiogram and serial troponins Order EKG Order carotid Doppler Order orthostatic vitals Further work-up pending the studies I will check CBC and BMP as these have not been performed.   2.  Essential hypertension: Continue Norvasc  3.  Mild dementia: Continue donepezil  4.  Postoperative day #0 right hip arthroplasty: Management as per orthopedic surgery. All the records are reviewed and case discussed with ED provider. Management plans discussed with the patient and wife and they are in agreement  CODE STATUS: DNR  TOTAL TIME TAKING CARE OF THIS PATIENT: 42 minutes.  Discussed with orthopedic surgery  Thank you for allowing [place] to participate in the care of your patient.  We will continue to follow.  Lailie Smead M.D on 11/13/2017 at 3:54 PM  Between 7am to 6pm - Pager - [phone]  After 6pm go to [email or web address] - password [email or web address] [facility]  Office  [phone]  CC: Primary care physician; Leone Haven, MD

## 2017-11-13 NOTE — Progress Notes (Signed)
  Subjective: Day of Surgery Procedure(s) (LRB): TOTAL HIP ARTHROPLASTY (Right) Patient reports pain as mild.   I was contacted to visit the patient due to a syncopal episode while working with PT. Pt was up with therapy when he passed out, he did not hit the ground, staff was able to lower him down to the floor, they don't think that his hip went into any hyperflexed position and he is not complaining of any increased right hip pain at this time.  Objective: Vital signs in last 24 hours: Temp:  [97.6 F (36.4 C)-98.3 F (36.8 C)] 98.3 F (36.8 C) (08/27 1439) Pulse Rate:  [45-66] 55 (08/27 1540) Resp:  [9-20] 18 (08/27 1439) BP: (103-191)/(63-90) 160/77 (08/27 1540) SpO2:  [93 %-99 %] 96 % (08/27 1540) Weight:  [77 kg] 77 kg (08/27 1136)  Intake/Output from previous day:  Intake/Output Summary (Last 24 hours) at 11/13/2017 1555 Last data filed at 11/13/2017 1402 Gross per 24 hour  Intake 2779.94 ml  Output 1200 ml  Net 1579.94 ml    Intake/Output this shift: Total I/O In: 2779.9 [P.O.:700; I.V.:2059.9; IV Piggyback:20.1] Out: 1200 [Urine:1000; Blood:200]  Labs: No results for input(s): HGB in the last 72 hours. No results for input(s): WBC, RBC, HCT, PLT in the last 72 hours. No results for input(s): NA, K, CL, CO2, BUN, CREATININE, GLUCOSE, CALCIUM in the last 72 hours. No results for input(s): LABPT, INR in the last 72 hours.   EXAM General - Patient is Alert, Appropriate and Oriented Extremity - Leg lengths are equal, no internal or external rotation appearance to the right leg.  Minimal pain with internal and external rotation to the right hip, no palpable deformity to the right hip. Dressing/Incision - clean, dry, no drainage Motor Function - intact, moving foot and toes well on exam.  Past Medical History:  Diagnosis Date  . Arthritis   . Cancer ([place]) 2018   bladder cancer  . Complication of anesthesia    slow to get rid of anesthesia, over a couple of days.   . Diverticulosis   . Fibromyalgia 1995  . Glaucoma    pt denies  . History of bladder cancer 2018  . History of kidney stones   . Hyperlipidemia   . Hyperparathyroidism ([place]) 10/2017  . Hypertension   . Positive TB test   . Tubulovillous adenoma of colon     Assessment/Plan: Day of Surgery Procedure(s) (LRB): TOTAL HIP ARTHROPLASTY (Right) Active Problems:   Status post total hip replacement, right  Estimated body mass index is 25.82 kg/m as calculated from the following:   Height as of this encounter: 5\' 8"  (1.727 m).   Weight as of this encounter: 77 kg.  At this time I don't believe there has been any acute damage to the right hip. DG Pelvis has been ordered. Internal medicine consulted for work-up of syncope. Will re-evaluate patient in the morning.  DVT Prophylaxis - Lovenox, Foot Pumps and TED hose Weight-Bearing as tolerated to right leg  J. Cameron Proud, PA-C [facility] Orthopaedic Surgery 11/13/2017, 3:55 PM

## 2017-11-13 NOTE — Progress Notes (Signed)
Pt admitted to room 145 from PACU. Pt is A&Ox4, he can bend both knees. Pt and his wife educated on plan of care and pain medication regimen. Abudction pillow and ice pack in place, honeycomb dressing CDI to right hip.   [place], Jerry Caras

## 2017-11-13 NOTE — Consult Note (Signed)
[facility] NAME: Aaron Quinn    MR#:  [id number]  DATE OF BIRTH:  Jul 11, 1931  DATE OF ADMISSION:  11/13/2017  PRIMARY CARE PHYSICIAN: Leone Haven, MD   REQUESTING/REFERRING PHYSICIAN: Dr Roland Rack  CHIEF COMPLAINT:    syncope HISTORY OF PRESENT ILLNESS:  Aaron Quinn  is a 84 y.o. male with a known history of hyperparathyroidism, essential hypertension and fibromyalgia who is postoperative day #0 for right total hip arthroplasty.  Hospitalist service was consulted due to syncopal event.  Patient had an uneventful surgical procedure today.  After returning to his room he started working with physical therapy.  The patient went from the bed and took about 2 steps when he apparently passed out.  He leaned forward however he never fell because a therapist was able to catch him and put him back on his bed.  The therapist reports that he was unable to find a carotid pulse and the patient was unresponsive.  The patient finally came to after several seconds.  During this time he was also snoring.  Patient has no cardiac history and this is never happened to him before.  Patient reports no chest pain, dizziness, lightheadedness, fever, chills, nausea or vomiting.  Patient had no pain when working with physical therapy.  His blood pressure systolic was in the 703J after this acute event and his heart rate in the 50s.  PAST MEDICAL HISTORY:       Past Medical History:  Diagnosis Date  . Arthritis   . Cancer ([place]) 2018   bladder cancer  . Complication of anesthesia    slow to get rid of anesthesia, over a couple of days.  . Diverticulosis   . Fibromyalgia 1995  . Glaucoma    pt denies  . History of bladder cancer 2018  . History of kidney stones   . Hyperlipidemia   . Hyperparathyroidism ([place]) 10/2017  . Hypertension   . Positive TB test   . Tubulovillous adenoma of colon     PAST SURGICAL HISTORY:         Past Surgical History:  Procedure Laterality Date  . APPENDECTOMY  2005  . CATARACT EXTRACTION, BILATERAL  2012  . COLONOSCOPY W/ BIOPSIES    . CYST EXCISION Right 1950's   thigh  . HEMORRHOID BANDING    . KNEE ARTHROSCOPY Left 2014  . KNEE CARTILAGE SURGERY Left 1951  . NASAL SINUS SURGERY  1950's  . TONSILLECTOMY  1940  . TRANSURETHRAL RESECTION OF BLADDER TUMOR N/A 02/13/2017   Procedure: TRANSURETHRAL RESECTION OF BLADDER TUMOR (TURBT);  Surgeon: Abbie Sons, MD;  Location: [facility] ORS;  Service: Urology;  Laterality: N/A;  45 minutes needed  . VASECTOMY  1972    SOCIAL HISTORY:   Social History        Tobacco Use  . Smoking status: Former Smoker    Packs/day: 0.50    Years: 20.00    Pack years: 10.00    Types: Cigarettes    Last attempt to quit: 03/20/1982    Years since quitting: 35.6  . Smokeless tobacco: Never Used  Substance Use Topics  . Alcohol use: No    Alcohol/week: 0.0 standard drinks    FAMILY HISTORY:        Family History  Problem Relation Age of Onset  . Colon cancer Maternal Grandmother 45       Died at 81  . Heart disease Father   .  Arthritis Unknown        Parent  . Hypertension Unknown        Parent, grandparent  . Diabetes Son   . Alcohol abuse Son   . Depression Son   . Liver cancer Mother   . Rheum arthritis Mother   . Colon polyps Neg Hx   . Kidney disease Neg Hx   . Esophageal cancer Neg Hx   . Gallbladder disease Neg Hx     DRUG ALLERGIES:  No Known Allergies  REVIEW OF SYSTEMS:   Review of Systems  Constitutional: Negative.  Negative for chills, fever and malaise/fatigue.  HENT: Negative.  Negative for ear discharge, ear pain, hearing loss, nosebleeds and sore throat.   Eyes: Negative.  Negative for blurred vision and pain.  Respiratory: Negative.  Negative for cough, hemoptysis, shortness of breath and wheezing.   Cardiovascular: Negative.  Negative for  chest pain, palpitations and leg swelling.  Gastrointestinal: Negative.  Negative for abdominal pain, blood in stool, diarrhea, nausea and vomiting.  Genitourinary: Negative.  Negative for dysuria.  Musculoskeletal: Negative.  Negative for back pain.  Skin: Negative.   Neurological: Negative for dizziness, tremors, speech change, focal weakness, seizures and headaches.  Endo/Heme/Allergies: Negative.  Does not bruise/bleed easily.  Psychiatric/Behavioral: Negative.  Negative for depression, hallucinations and suicidal ideas.    MEDICATIONS AT HOME:          Prior to Admission medications   Medication Sig Start Date End Date Taking? Authorizing Provider  amLODipine (NORVASC) 2.5 MG tablet Take 2.5 mg daily by mouth.    Yes [provider]  citalopram (CELEXA) 20 MG tablet Take 20 mg by mouth daily.   Yes [provider]  donepezil (ARICEPT) 10 MG tablet Take 10 mg by mouth every evening.   Yes [provider]  fluticasone (FLONASE) 50 MCG/ACT nasal spray Place 1 spray into both nostrils daily as needed for allergies.    Yes [provider]  gabapentin (NEURONTIN) 300 MG capsule Take 300 mg by mouth 4 (four) times daily.    Yes [provider]  ketoconazole (NIZORAL) 2 % shampoo Apply 1 application topically once a week.  01/10/15  Yes [provider]  loratadine (CLARITIN) 10 MG tablet Take 10 mg by mouth daily.   Yes [provider]  LYRICA 25 MG capsule Take 25 mg by mouth 3 (three) times daily.  10/10/16  Yes [provider]  zolpidem (AMBIEN) 10 MG tablet Take 1 tablet (10 mg total) by mouth at bedtime as needed for sleep. 11/15/17 02/13/18 Yes Leone Haven, MD      VITAL SIGNS:  Blood pressure (!) 160/77, pulse (!) 55, temperature 98.3 F (36.8 C), temperature source Oral, resp. rate 18, height 5\' 8"  (1.727 m), weight 77 kg, SpO2 96 %.  PHYSICAL EXAMINATION:   Physical Exam   Constitutional: He is oriented to person, place, and time. No distress.  HENT:  Head: Normocephalic.  Eyes: No scleral icterus.  Neck: Normal range of motion. Neck supple. No JVD present. No tracheal deviation present.  Cardiovascular: Normal rate, regular rhythm and normal heart sounds. Exam reveals no gallop and no friction rub.  No murmur heard. Pulmonary/Chest: Effort normal and breath sounds normal. No respiratory distress. He has no wheezes. He has no rales. He exhibits no tenderness.  Abdominal: Soft. Bowel sounds are normal. He exhibits no distension and no mass. There is no tenderness. There is no rebound and no guarding.  Musculoskeletal: Normal  range of motion. He exhibits no edema.  Neurological: He is alert and oriented to person, place, and time.  Skin: Skin is warm. No rash noted. No erythema.  Psychiatric: Judgment normal.      LABORATORY PANEL:   CBC LastLabs  No results for input(s): WBC, HGB, HCT, PLT in the last 168 hours.   ------------------------------------------------------------------------------------------------------------------  Chemistries   LastLabs  No results for input(s): NA, K, CL, CO2, GLUCOSE, BUN, CREATININE, CALCIUM, MG, AST, ALT, ALKPHOS, BILITOT in the last 168 hours.  Invalid input(s): GFRCGP   ------------------------------------------------------------------------------------------------------------------  Cardiac Enzymes LastLabs  No results for input(s): TROPONINI in the last 168 hours.   ------------------------------------------------------------------------------------------------------------------  RADIOLOGY:   ImagingResults(Last48hours)  Dg Hip Unilat W Or W/o Pelvis 2-3 Views Right  Result Date: 11/13/2017 CLINICAL DATA:  Status post right total hip joint prosthesis placement. EXAM: DG HIP (WITH OR WITHOUT PELVIS) 2-3V RIGHT COMPARISON:  Coronal and sagittal reconstructed images through the the  pelvis from an abdominal and pelvic CT scan of January 24, 2017 FINDINGS: The patient has undergone right total hip joint prosthesis placement. Radiographic positioning of the prosthetic components is good. The interface with the native bone appears normal. No acute native bone abnormality is observed. IMPRESSION: No immediate complication following right total hip joint prosthesis placement. Electronically Signed   By: David  Martinique M.D.   On: 11/13/2017 10:50     EKG:  Pending  IMPRESSION AND PLAN:   84 year old male postoperative day #1 right hip total arthroplasty who had a syncopal event while working with physical therapy.  1.  Syncope: I suspect this is more orthostasis in nature. Start telemetry monitoring. Order echocardiogram and serial troponins Order EKG Order carotid Doppler Order orthostatic vitals Further work-up pending the studies I will check CBC and BMP as these have not been performed.   2.  Essential hypertension: Continue Norvasc  3.  Mild dementia: Continue donepezil  4.  Postoperative day #0 right hip arthroplasty: Management as per orthopedic surgery. All the records are reviewed and case discussed with ED provider. Management plans discussed with the patient and wife and they are in agreement  CODE STATUS: DNR  TOTAL TIME TAKING CARE OF THIS PATIENT: 42 minutes.  Discussed with orthopedic surgery  Thank you for allowing [place] to participate in the care of your patient.  We will continue to follow.  Emoni Whitworth M.D on 11/13/2017 at 3:54 PM  Between 7am to 6pm - Pager - [phone]  After 6pm go to [email or web address] - password [email or web address] [facility]  Office  [phone]  CC: Primary care physician; Leone Haven, MD            Routing History

## 2017-11-13 NOTE — Progress Notes (Signed)
Physical Therapy Evaluation Patient Details Name: Aaron Quinn MRN: [id number] DOB: 1931/07/12 Today's Date: 11/13/2017   History of Present Illness  Pt underwent R THR posterior approach. PMH includes HTN, bladder CA, fibromyalgia, hyperlipidemia, HTN, and L partial knee replacement. PT evaluation performed on POD#0.  Clinical Impression  Pt admitted with above diagnosis. Pt currently with functional limitations due to the deficits listed below (see PT Problem List).  Pt requires minA+1 to assist RLE when moving to EOB. HOB elevated and use of bed rails. Pt demonstrates good safety when coming to standing. Once upright pt is steady and stable with UE support on rolling walker. Pt reports mild dizziness in standing upon questioning but states that he does not feel like he will pass out/black out. Pt denies nausea. Pt remains at bedside to ensure he feels well. Upon questioning pt reports feeling well and states that he would like to get to recliner. Pt is able to take 3-4 short steps to ambulate partially from bed to recliner. Once in front of recliner pt is instructed in R hip external rotation to turn to the right and avoid R hip internal rotation. During explanation by therapist pt loses consciousness and starts to fall forward. Physical therapist pulled pt in close to body and supported pt in an attempt to get him to the recliner. Therapist unable to lift patient onto the chair so pt was lowered slowly to the floor. Pt never struck his head during the loss of consciousness or while being assisted to the floor. Care taken to protect R hip from dislocation while lowering. Pt snoring loudly when being lowered to the floor and once in position. At therapist request wife calls for RN who calls a code. Therapist attempts to get a carotid pulse but is unable to palpate. Chest is not rising and falling. RN Raquel Sarna arrives and is also unable to palpate a pulse. Pt continuous snoring loudly. Upon recheck by  therapist faint pulse is appreciated at L carotid. Lower extremities elevated and pt eventually regains consciousness. Additional nursing team arrives with dynamap and BP is 191/78, confirmed with two seperate readings. HR is 53. Pt eventually assisted with Harrel Lemon back to bed by team with care taken to avoid breaking R posterior hip precautions. Full time patient was unconscious is around 25-45 seconds. Will continue to progress mobility at follow-up sessions with care taken to assess vitals. He should be appropriate to return home with [facility] PT and support from wife. Pt will benefit from PT services to address deficits in strength, balance, and mobility in order to return to full function at home.     Follow Up Recommendations Home health PT;Supervision - Intermittent    Equipment Recommendations  None recommended by PT    Recommendations for Other Services       Precautions / Restrictions Precautions Precautions: Fall;Posterior Hip Precaution Booklet Issued: Yes (comment) Restrictions Weight Bearing Restrictions: Yes RLE Weight Bearing: Weight bearing as tolerated      Mobility  Bed Mobility Overal bed mobility: Needs Assistance Bed Mobility: Supine to Sit     Supine to sit: Min assist     General bed mobility comments: Pt requires minA+1 to assist RLE when moving to EOB. HOB elevated and use of bed rails  Transfers Overall transfer level: Needs assistance Equipment used: Rolling walker (2 wheeled) Transfers: Sit to/from Stand Sit to Stand: Min guard         General transfer comment: Pt demonstrates good safety when coming to  standing. Once upright pt is steady and stable with UE support on rolling walker. Pt reports mild dizziness in standing but states that he does not feel like he will pass out/black out. Pt denies nausea. Pt remains at bedside for a few seconds and reports that he feels well and would like to get to recliner  Ambulation/Gait Ambulation/Gait assistance:  Total assist Gait Distance (Feet): 3 Feet Assistive device: Rolling walker (2 wheeled)       General Gait Details: Pt is able to take 3-4 short steps to ambulate partially from bed to recliner. Once in front of recliner pt is instructed in R hip external rotation to turn to the right and avoid R hip internal rotation. During explanation by therapist pt loses consciousness and starts to fall forward. Physical therapist pulled pt in close to body and supported pt in an attempt to get him to the recliner. Therapist unable to lift patient onto the chair so pt was lowered slowly to the floor. Pt never struck his head during the loss of consciousness or while being assisted to the floor. Care taken to protect R hip from dislocation while lowering. Pt snoring loudly when being lowered to the floor and once in position. At therapist request wife calls for RN who calls a code. Therapist attempts to get a carotid pulse but is unable to palpate. Chest is not rising and falling. RN Raquel Sarna arrives and is also unable to palpate a pulse. Pt continuous snoring loudly. Upon recheck by therapist faint pulse is appreciated at L carotid. Lower extremities elevated and pt eventually regains consciousness. Additional nursing team arrives with dynamap and BP is 191/78, confirmed with two seperate readings. HR is 53. Pt eventually assisted with Harrel Lemon back to bed by team with care taken to avoid breaking R posterior hip precautions. Full time patient was unconscious is around 25-45 seconds.  Stairs            Wheelchair Mobility    Modified Rankin (Stroke Patients Only)       Balance Overall balance assessment: Needs assistance Sitting-balance support: No upper extremity supported Sitting balance-Leahy Scale: Good     Standing balance support: Bilateral upper extremity supported Standing balance-Leahy Scale: Fair Standing balance comment: Able to balance in standing with UE support on walker                              Pertinent Vitals/Pain Pain Assessment: 0-10 Pain Score: 3  Pain Location: R hip Pain Descriptors / Indicators: Operative site guarding Pain Intervention(s): Monitored during session    Home Living Family/patient expects to be discharged to:: Private residence Living Arrangements: Spouse/significant other Available Help at Discharge: Family Type of Home: House Home Access: Stairs to enter Entrance Stairs-Rails: Right;Left(Wide rails) Entrance Stairs-Number of Steps: 3 Home Layout: Multi-level;Able to live on main level with bedroom/bathroom Home Equipment: Gilford Rile - 2 wheels;Cane - single point;Bedside commode;Shower seat      Prior Function Level of Independence: Independent         Comments: Ambulating recently with assistance of single point cane but previously independent ambulation without assistive device. No falls in the last 12 months. Independent with ADLs/IADLs. Drives     Hand Dominance   Dominant Hand: Right    Extremity/Trunk Assessment   Upper Extremity Assessment Upper Extremity Assessment: Overall WFL for tasks assessed    Lower Extremity Assessment Lower Extremity Assessment: RLE deficits/detail RLE Deficits / Details: Assist  required for R SLR. Able to perform R SAQ without assistance. Full dorsiflexion/plantarflexion       Communication   Communication: No difficulties  Cognition Arousal/Alertness: Awake/alert Behavior During Therapy: WFL for tasks assessed/performed Overall Cognitive Status: Within Functional Limits for tasks assessed                                        General Comments      Exercises Total Joint Exercises Ankle Circles/Pumps: Both;10 reps Quad Sets: Both;10 reps Gluteal Sets: Both;10 reps Towel Squeeze: Both;10 reps Short Arc Quad: Right;10 reps Heel Slides: Right;10 reps Hip ABduction/ADduction: Right;10 reps Straight Leg Raises: Right;10 reps   Assessment/Plan    PT  Assessment Patient needs continued PT services  PT Problem List Decreased strength;Decreased activity tolerance;Decreased balance;Decreased mobility       PT Treatment Interventions DME instruction;Gait training;Therapeutic activities;Therapeutic exercise;Balance training;Neuromuscular re-education;Patient/family education;Stair training;Manual techniques    PT Goals (Current goals can be found in the Care Plan section)  Acute Rehab PT Goals Patient Stated Goal: Improve pain-free function at home PT Goal Formulation: With patient/family Time For Goal Achievement: 11/27/17 Potential to Achieve Goals: Good    Frequency BID   Barriers to discharge        Co-evaluation               AM-PAC PT "6 Clicks" Daily Activity  Outcome Measure Difficulty turning over in bed (including adjusting bedclothes, sheets and blankets)?: A Little Difficulty moving from lying on back to sitting on the side of the bed? : Unable Difficulty sitting down on and standing up from a chair with arms (e.g., wheelchair, bedside commode, etc,.)?: A Little Help needed moving to and from a bed to chair (including a wheelchair)?: Total Help needed walking in hospital room?: Total Help needed climbing 3-5 steps with a railing? : Total 6 Click Score: 10    End of Session Equipment Utilized During Treatment: Gait belt Activity Tolerance: Treatment limited secondary to medical complications (Comment)(Loss of consciousness) Patient left: in bed;with call bell/phone within reach;with nursing/sitter in room Nurse Communication: Other (comment)(Nurses present after code called) PT Visit Diagnosis: Unsteadiness on feet (R26.81);Muscle weakness (generalized) (M62.81);Difficulty in walking, not elsewhere classified (R26.2);Pain Pain - Right/Left: Right Pain - part of body: Hip    Time: 1459-1540 PT Time Calculation (min) (ACUTE ONLY): 41 min   Charges:   PT Evaluation $PT Eval Moderate Complexity: 1 Mod PT  Treatments $Therapeutic Exercise: 8-22 mins        Lyndel Safe Huprich PT, DPT, GCS  Huprich,Jason 11/13/2017, 5:04 PM

## 2017-11-13 NOTE — H&P (Signed)
Paper H&P to be scanned into permanent record. H&P reviewed and patient re-examined. No changes. 

## 2017-11-13 NOTE — Progress Notes (Signed)
Incentive spirometry instructions given by nursing.

## 2017-11-13 NOTE — Anesthesia Procedure Notes (Signed)
Spinal  Patient location during procedure: OR Start time: 11/13/2017 7:38 AM End time: 11/13/2017 7:40 AM Staffing Resident/CRNA: Bernardo Heater, CRNA Performed: resident/CRNA  Preanesthetic Checklist Completed: patient identified, site marked, surgical consent, pre-op evaluation, timeout performed, IV checked, risks and benefits discussed and monitors and equipment checked Spinal Block Patient position: sitting Prep: ChloraPrep Patient monitoring: heart rate, continuous pulse ox, blood pressure and cardiac monitor Approach: midline Location: L4-5 Injection technique: single-shot Needle Needle type: Introducer and Pencan  Needle gauge: 24 G Needle length: 9 cm Additional Notes Negative paresthesia. Negative blood return. Positive free-flowing CSF. Expiration date of kit checked and confirmed. Patient tolerated procedure well, without complications.

## 2017-11-13 NOTE — Transfer of Care (Signed)
Immediate Anesthesia Transfer of Care Note  Patient: Aaron Quinn  Procedure(s) Performed: TOTAL HIP ARTHROPLASTY (Right Hip)  Patient Location: PACU  Anesthesia Type:Spinal  Level of Consciousness: awake and patient cooperative  Airway & Oxygen Therapy: Patient Spontanous Breathing and Patient connected to nasal cannula oxygen  Post-op Assessment: Report given to RN and Post -op Vital signs reviewed and stable  Post vital signs: Reviewed and stable  Last Vitals:  Vitals Value Taken Time  BP 103/64 11/13/2017 10:04 AM  Temp 36.6 C 11/13/2017 10:04 AM  Pulse 57 11/13/2017 10:06 AM  Resp 13 11/13/2017 10:06 AM  SpO2 98 % 11/13/2017 10:06 AM  Vitals shown include unvalidated device data.  Last Pain:  Vitals:   11/13/17 0609  TempSrc: Tympanic  PainSc: 2          Complications: No apparent anesthesia complications

## 2017-11-14 ENCOUNTER — Inpatient Hospital Stay (HOSPITAL_COMMUNITY)
Admission: RE | Admit: 2017-11-14 | Discharge: 2017-11-14 | Disposition: A | Discharge status: Home / Self Care | Payer: Medicare Other | Source: Ambulatory Visit | Attending: Internal Medicine | Admitting: Internal Medicine

## 2017-11-14 ENCOUNTER — Inpatient Hospital Stay: Payer: Medicare Other

## 2017-11-14 DIAGNOSIS — I340 Nonrheumatic mitral (valve) insufficiency: Secondary | ICD-10-CM

## 2017-11-14 LAB — BASIC METABOLIC PANEL
ANION GAP: 4 — AB (ref 5–15)
BUN: 12 mg/dL (ref 8–23)
CALCIUM: 8.8 mg/dL — AB (ref 8.9–10.3)
CHLORIDE: 101 mmol/L (ref 98–111)
CO2: 26 mmol/L (ref 22–32)
Creatinine, Ser: 1.01 mg/dL (ref 0.61–1.24)
GFR calc Af Amer: >60 mL/min (ref >60)
GFR calc non Af Amer: >60 mL/min (ref >60)
Glucose, Bld: 137 mg/dL — ABNORMAL HIGH (ref 70–99)
POTASSIUM: 4.2 mmol/L (ref 3.5–5.1)
Sodium: 131 mmol/L — ABNORMAL LOW (ref 135–145)

## 2017-11-14 LAB — LIPID PANEL
CHOL/HDL RATIO: 3.9 ratio
Cholesterol: 126 mg/dL (ref 0–200)
HDL: 32 mg/dL — AB (ref >40)
LDL CALC: 71 mg/dL (ref 0–99)
TRIGLYCERIDES: 113 mg/dL (ref <150)
VLDL: 23 mg/dL (ref 0–40)

## 2017-11-14 LAB — CBC WITH DIFFERENTIAL/PLATELET
BASOS PCT: 0 %
Basophils Absolute: 0 10*3/uL (ref 0–0.1)
Eosinophils Absolute: 0 10*3/uL (ref 0–0.7)
Eosinophils Relative: 0 %
HEMATOCRIT: 31 % — AB (ref 40.0–52.0)
HEMOGLOBIN: 11 g/dL — AB (ref 13.0–18.0)
LYMPHS PCT: 14 %
Lymphs Abs: 1.3 10*3/uL (ref 1.0–3.6)
MCH: 32 pg (ref 26.0–34.0)
MCHC: 35.4 g/dL (ref 32.0–36.0)
MCV: 90.2 fL (ref 80.0–100.0)
MONO ABS: 0.7 10*3/uL (ref 0.2–1.0)
Monocytes Relative: 8 %
NEUTROS ABS: 6.9 10*3/uL — AB (ref 1.4–6.5)
NEUTROS PCT: 78 %
Platelets: 185 10*3/uL (ref 150–440)
RBC: 3.44 MIL/uL — AB (ref 4.40–5.90)
RDW: 13.4 % (ref 11.5–14.5)
WBC: 8.9 10*3/uL (ref 3.8–10.6)

## 2017-11-14 LAB — ECHOCARDIOGRAM COMPLETE
Height: 68 in
Weight: 2716.8079 oz

## 2017-11-14 LAB — TROPONIN I: Troponin I: <0.03 ng/mL (ref <0.03)

## 2017-11-14 MED ORDER — TAMSULOSIN HCL 0.4 MG PO CAPS
0.4000 mg | ORAL_CAPSULE | Freq: Every day | ORAL | Status: DC
  Administered 2017-11-14 – 2017-11-16 (×3): 0.4 mg via ORAL
  Filled 2017-11-14 (×3): qty 1

## 2017-11-14 MED ORDER — POLYETHYLENE GLYCOL 3350 17 G PO PACK: 17.0000 g | PACK | Freq: Every day | ORAL | Status: DC | PRN

## 2017-11-14 MED ORDER — SENNOSIDES-DOCUSATE SODIUM 8.6-50 MG PO TABS
1.0000 | ORAL_TABLET | Freq: Two times a day (BID) | ORAL | Status: DC
  Administered 2017-11-14 – 2017-11-15 (×4): 1 via ORAL
  Filled 2017-11-14 (×4): qty 1

## 2017-11-14 MED ORDER — IOHEXOL 350 MG/ML SOLN
75.0000 mL | Freq: Once | INTRAVENOUS | Status: AC | PRN
  Administered 2017-11-14: 75 mL via INTRAVENOUS

## 2017-11-14 NOTE — Progress Notes (Signed)
Clinical Social Worker (CSW) received SNF consult. PT is recommending home health. RN case manager aware of above. Please reconsult if future social work needs arise. CSW signing off.   Bina Veenstra, LCSW [phone] 

## 2017-11-14 NOTE — Progress Notes (Signed)
  Subjective: 1 Day Post-Op Procedure(s) (LRB): TOTAL HIP ARTHROPLASTY (Right) Patient reports pain as mild.  States that the pain in the hip is better than prior to surgery already. Patient is well but did have a syncopal episode yesterday, hospitalist was consulted  Carotid US and ECHO ordered for the patient. PT and Care management to assist with discharge planning. Negative for chest pain and shortness of breath Fever: no Gastrointestinal:negative for nausea and vomiting  Objective: Vital signs in last 24 hours: Temp:  [97.5 F (36.4 C)-99.3 F (37.4 C)] 98.3 F (36.8 C) (08/28 0749) Pulse Rate:  [45-60] 59 (08/28 0749) Resp:  [9-20] 18 (08/28 0749) BP: (103-191)/(59-78) 132/68 (08/28 0749) SpO2:  [93 %-99 %] 95 % (08/28 0749) Weight:  [77 kg] 77 kg (08/27 1136)  Intake/Output from previous day:  Intake/Output Summary (Last 24 hours) at 11/14/2017 0756 Last data filed at 11/14/2017 0622 Gross per 24 hour  Intake 4024.29 ml  Output 2175 ml  Net 1849.29 ml    Intake/Output this shift: No intake/output data recorded.  Labs: Recent Labs    11/14/17 0419  HGB 11.0*   Recent Labs    11/14/17 0419  WBC 8.9  RBC 3.44*  HCT 31.0*  PLT 185   Recent Labs    11/14/17 0419  NA 131*  K 4.2  CL 101  CO2 26  BUN 12  CREATININE 1.01  GLUCOSE 137*  CALCIUM 8.8*   No results for input(s): LABPT, INR in the last 72 hours.   EXAM General - Patient is Alert, Appropriate and Oriented Extremity - Neurovascular intact Sensation intact distally Intact pulses distally Dorsiflexion/Plantar flexion intact Incision: dressing C/D/I No cellulitis present Dressing/Incision - clean, dry, no drainage Motor Function - intact, moving foot and toes well on exam.  Abdomen with mild distention, mild tympany but normal BS.  Past Medical History:  Diagnosis Date  . Arthritis   . Cancer ([place]) 2018   bladder cancer  . Complication of anesthesia    slow to get rid of  anesthesia, over a couple of days.  . Diverticulosis   . Fibromyalgia 1995  . Glaucoma    pt denies  . History of bladder cancer 2018  . History of kidney stones   . Hyperlipidemia   . Hyperparathyroidism ([place]) 10/2017  . Hypertension   . Positive TB test   . Tubulovillous adenoma of colon     Assessment/Plan: 1 Day Post-Op Procedure(s) (LRB): TOTAL HIP ARTHROPLASTY (Right) Active Problems:   Status post total hip replacement, right  Estimated body mass index is 25.82 kg/m as calculated from the following:   Height as of this encounter: 5\' 8"  (1.727 m).   Weight as of this encounter: 77 kg. Up with therapy D/C IV fluids when tolerating po intake.  Labs reviewed this AM, Na 131, encouraged increased oral intake. Slowly increase oral intake due to mild bowel distention. Up with therapy today.  Encouraged incentive spirometer. Pt is passing gas, will continue to work on BM. X-ray of hip after syncopal episode did not reveal any acute abnormality.  DVT Prophylaxis - Lovenox, Foot Pumps and TED hose Weight-Bearing as tolerated to right leg  J. Cameron Proud, PA-C [facility] Orthopaedic Surgery 11/14/2017, 7:56 AM

## 2017-11-14 NOTE — Progress Notes (Signed)
*  PRELIMINARY RESULTS* Echocardiogram 2D Echocardiogram has been performed.  Aaron Quinn 11/14/2017, 11:07 AM

## 2017-11-14 NOTE — Progress Notes (Addendum)
Physical Therapy Treatment Patient Details Name: Aaron Quinn MRN: [id number] DOB: Mar 01, 1932 Today's Date: 11/14/2017    History of Present Illness Pt underwent R THR posterior approach. PMH includes HTN, bladder CA, fibromyalgia, hyperlipidemia, HTN, and L partial knee replacement. PT evaluation performed on POD#0.    PT Comments    Pt making excellent progress with therapy this afternoon. SaO2 89% on room air at rest upon arrival. He demonstrates improved sequencing with bed mobility and transfers. Pt is able to complete a full lap around RN station with therapist. Two standing rest breaks provided to check vitals. SaO2 92-94% on 2L/min supplemental O2 during ambulation. Pt denies DOE, chest pain, or palpitations. Gait speed is slow however speeds up with cuing to progress to reciprocal pattern. Pt reports feeling slightly dizzy at end of ambulation so he was returned to recliner and wheeled back into room. He is able to complete all supine exercises as instructed. Pt will need to be able to perform stairs tomorrow before he can discharge. He continues to present with a blunted affect and is somewhat slow to answer questions. Wife agrees. Wife is somewhat concerned about pt discharging tomorrow due to slowed cognition. Encouraged wife to speak with orthopedic surgeon. Pt will benefit from PT services to address deficits in strength, balance, and mobility in order to return to full function at home.    Follow Up Recommendations  Home health PT;Supervision - Intermittent     Equipment Recommendations  None recommended by PT    Recommendations for Other Services       Precautions / Restrictions Precautions Precautions: Fall;Posterior Hip Precaution Booklet Issued: Yes (comment) Restrictions Weight Bearing Restrictions: Yes RLE Weight Bearing: Weight bearing as tolerated    Mobility  Bed Mobility Overal bed mobility: Needs Assistance Bed Mobility: Supine to Sit     Supine to  sit: Min assist     General bed mobility comments: Pt requires minA+1 to assist RLE when moving to EOB. HOB elevated and use of bed rails  Transfers Overall transfer level: Needs assistance Equipment used: Rolling walker (2 wheeled) Transfers: Sit to/from Stand Sit to Stand: Min assist         General transfer comment: Pt initially requires minA+1 to come to standing from EOB but when standing from chair later in the session only requires CGA. Pt continues to require cues for safe hand placement during transfers  Ambulation/Gait Ambulation/Gait assistance: Min guard Gait Distance (Feet): 200 Feet Assistive device: Rolling walker (2 wheeled)       General Gait Details: Pt is able to complete a full lap around RN station with therapist. Two standing rest breaks provided to check vitals. SaO2 92-94% on 2L/min supplemental O2 during ambulation. Pt denies DOE, chest pain, or palpitations. Gait speed is slow however speeds up with cuing to progress to reciprocal pattern. Pt reports feeling slightly dizzy at end of ambulation so he was returned to recliner and wheeled back into room.   Stairs             Wheelchair Mobility    Modified Rankin (Stroke Patients Only)       Balance Overall balance assessment: Needs assistance Sitting-balance support: No upper extremity supported Sitting balance-Leahy Scale: Good     Standing balance support: Bilateral upper extremity supported Standing balance-Leahy Scale: Fair Standing balance comment: Able to balance in standing with UE support on walker  Cognition Arousal/Alertness: Awake/alert Behavior During Therapy: WFL for tasks assessed/performed Overall Cognitive Status: Within Functional Limits for tasks assessed                                        Exercises Total Joint Exercises Ankle Circles/Pumps: Both;10 reps Quad Sets: Both;10 reps Gluteal Sets: Both;10  reps Towel Squeeze: Both;10 reps Short Arc Quad: Right;10 reps Heel Slides: Right;10 reps Hip ABduction/ADduction: Right;10 reps Straight Leg Raises: Right;10 reps    General Comments        Pertinent Vitals/Pain Pain Assessment: 0-10 Pain Score: 6  Pain Location: R hip Pain Descriptors / Indicators: Operative site guarding Pain Intervention(s): Monitored during session    Home Living                      Prior Function            PT Goals (current goals can now be found in the care plan section) Acute Rehab PT Goals Patient Stated Goal: Improve pain-free function at home PT Goal Formulation: With patient/family Time For Goal Achievement: 11/27/17 Potential to Achieve Goals: Good Progress towards PT goals: Progressing toward goals    Frequency    BID      PT Plan Current plan remains appropriate    Co-evaluation              AM-PAC PT "6 Clicks" Daily Activity  Outcome Measure  Difficulty turning over in bed (including adjusting bedclothes, sheets and blankets)?: A Little Difficulty moving from lying on back to sitting on the side of the bed? : A Little Difficulty sitting down on and standing up from a chair with arms (e.g., wheelchair, bedside commode, etc,.)?: A Little Help needed moving to and from a bed to chair (including a wheelchair)?: A Little Help needed walking in hospital room?: A Little Help needed climbing 3-5 steps with a railing? : A Lot 6 Click Score: 17    End of Session Equipment Utilized During Treatment: Gait belt Activity Tolerance: Patient tolerated treatment well Patient left: with family/visitor present;in bed;with bed alarm set;with SCD's reapplied;Other (comment)(abduction pillows between knees)   PT Visit Diagnosis: Unsteadiness on feet (R26.81);Muscle weakness (generalized) (M62.81);Difficulty in walking, not elsewhere classified (R26.2);Pain Pain - Right/Left: Right Pain - part of body: Hip     Time:  1510-1540 PT Time Calculation (min) (ACUTE ONLY): 30 min  Charges:  $Gait Training: 8-22 mins $Therapeutic Exercise: 8-22 mins                     Lyndel Safe Joliyah Lippens PT, DPT, GCS    Yareni Creps 11/14/2017, 4:14 PM

## 2017-11-14 NOTE — Progress Notes (Signed)
Pt voiding small amounts of 100-200cc overnight, has history of BPH per pt. Post void bladder scan (void was 250cc) showed 478 cc. Per verbal order from [facility], can I&O cath if bladder scan >400cc. I&O cath returned 450cc of clear yellow urine. Dr. Tressia Miners started pt on flomax.   [name], Aaron Quinn

## 2017-11-14 NOTE — Progress Notes (Signed)
Physical Therapy Treatment Patient Details Name: Aaron Quinn MRN: [id number] DOB: 04-03-1931 Today's Date: 11/14/2017    History of Present Illness Pt underwent R THR posterior approach. PMH includes HTN, bladder CA, fibromyalgia, hyperlipidemia, HTN, and L partial knee replacement. PT evaluation performed on POD#0.    PT Comments    Pt admitted with above diagnosis. Pt currently with functional limitations due to the deficits listed below (see PT Problem List).  Pt making good progress with therapy on this date. Pt demonstrates good safety when coming to standing. Once upright pt is steady and stable with UE support on rolling walker. Pt reports mild dizziness in standing but states that he does not feel like he will pass out/black out. Orthostatic vitals obtained which are negative. Supine readings BP: 141/65, HR: 55, Sitting: BP: 136/69, HR: 60, Standing: BP: 119/63, HR: 63.  Stood at edge of bed with patient prior to ambulation to ensure no syncope. Pt is able to ambulate partially around RN station with therapist. Gait is slow but pt is steady. Cues initially for proper sequencing with walker. Once pt is partially around RN station he reports that he starts to feel nauseaous. Wife followed patient with recliner so pt sat in chair. Pt proceeded to vomit. Pt returned to room in recliner and further ambulation deferred. Will continue to progress ambulation and exercise as pt is able. Pt will benefit from PT services to address deficits in strength, balance, and mobility in order to return to full function at home.   Follow Up Recommendations  Home health PT;Supervision - Intermittent     Equipment Recommendations  None recommended by PT    Recommendations for Other Services       Precautions / Restrictions Precautions Precautions: Fall;Posterior Hip Precaution Booklet Issued: Yes (comment) Restrictions Weight Bearing Restrictions: Yes RLE Weight Bearing: Weight bearing as tolerated     Mobility  Bed Mobility Overal bed mobility: Needs Assistance Bed Mobility: Supine to Sit     Supine to sit: Min assist     General bed mobility comments: Pt requires minA+1 to assist RLE when moving to EOB. HOB elevated and use of bed rails  Transfers Overall transfer level: Needs assistance Equipment used: Rolling walker (2 wheeled) Transfers: Sit to/from Stand Sit to Stand: Min guard         General transfer comment: Pt demonstrates good safety when coming to standing. Once upright pt is steady and stable with UE support on rolling walker. Pt reports mild dizziness in standing but states that he does not feel like he will pass out/black out. Stood at edge of bed with patient prior to ambulation.  Ambulation/Gait Ambulation/Gait assistance: Min guard Gait Distance (Feet): 100 Feet Assistive device: Rolling walker (2 wheeled)       General Gait Details: Pt is able to ambulate partially around RN station with therapist. Gait is slow but pt is steady. Cues initially for proper sequencing with walker. Once pt is partially around RN station he reports that he starts to feel nauseaous. Wife followed patient with recliner so pt sat in chair. Pt proceeded to vomit. Pt returned to room in recliner and further ambulation deferred.    Stairs             Wheelchair Mobility    Modified Rankin (Stroke Patients Only)       Balance Overall balance assessment: Needs assistance Sitting-balance support: No upper extremity supported Sitting balance-Leahy Scale: Good     Standing balance support: Bilateral  upper extremity supported Standing balance-Leahy Scale: Fair Standing balance comment: Able to balance in standing with UE support on walker                            Cognition Arousal/Alertness: Awake/alert Behavior During Therapy: WFL for tasks assessed/performed Overall Cognitive Status: Within Functional Limits for tasks assessed                                         Exercises Total Joint Exercises Ankle Circles/Pumps: Both;10 reps Quad Sets: Both;10 reps Gluteal Sets: Both;10 reps Heel Slides: Right;10 reps Hip ABduction/ADduction: Right;10 reps Straight Leg Raises: Right;10 reps    General Comments        Pertinent Vitals/Pain Pain Assessment: 0-10 Pain Score: 3  Pain Location: R hip Pain Descriptors / Indicators: Operative site guarding Pain Intervention(s): Monitored during session    Home Living                      Prior Function            PT Goals (current goals can now be found in the care plan section) Acute Rehab PT Goals Patient Stated Goal: Improve pain-free function at home PT Goal Formulation: With patient/family Time For Goal Achievement: 11/27/17 Potential to Achieve Goals: Good Progress towards PT goals: Progressing toward goals    Frequency    BID      PT Plan Current plan remains appropriate    Co-evaluation              AM-PAC PT "6 Clicks" Daily Activity  Outcome Measure  Difficulty turning over in bed (including adjusting bedclothes, sheets and blankets)?: A Little Difficulty moving from lying on back to sitting on the side of the bed? : A Little Difficulty sitting down on and standing up from a chair with arms (e.g., wheelchair, bedside commode, etc,.)?: A Little Help needed moving to and from a bed to chair (including a wheelchair)?: A Little Help needed walking in hospital room?: A Little Help needed climbing 3-5 steps with a railing? : A Lot 6 Click Score: 17    End of Session Equipment Utilized During Treatment: Gait belt Activity Tolerance: Treatment limited secondary to medical complications (Comment)(Limited due to vomiting) Patient left: with call bell/phone within reach;in chair;with chair alarm set;with family/visitor present   PT Visit Diagnosis: Unsteadiness on feet (R26.81);Muscle weakness (generalized) (M62.81);Difficulty in  walking, not elsewhere classified (R26.2);Pain Pain - Right/Left: Right Pain - part of body: Hip     Time: 8453-6468 PT Time Calculation (min) (ACUTE ONLY): 46 min  Charges:  $Gait Training: 8-22 mins $Therapeutic Exercise: 8-22 mins                     Lyndel Safe Cortland Crehan PT, DPT, GCS    Elmo Shumard 11/14/2017, 2:08 PM

## 2017-11-14 NOTE — Progress Notes (Signed)
[facility] [place] NAME: Aaron Quinn    MR#:  [id number]  DATE OF BIRTH:  1931-09-18  SUBJECTIVE:  CHIEF COMPLAINT:  No chief complaint on file.  -Postop day 1 after right total hip arthroplasty.  Patient had a syncopal episode yesterday.  Episode of nausea and vomiting today while working with physical therapy.  REVIEW OF SYSTEMS:  Review of Systems  Constitutional: Negative for chills and fever.  HENT: Negative for congestion, ear discharge, hearing loss and nosebleeds.   Eyes: Negative for blurred vision and double vision.  Respiratory: Negative for cough, shortness of breath and wheezing.   Cardiovascular: Negative for chest pain, palpitations and leg swelling.  Gastrointestinal: Positive for nausea and vomiting. Negative for abdominal pain, constipation and diarrhea.  Genitourinary: Negative for dysuria.  Musculoskeletal: Positive for myalgias.  Skin: Negative for rash.  Neurological: Positive for dizziness. Negative for speech change, focal weakness, seizures, weakness and headaches.  Psychiatric/Behavioral: Negative for depression.    DRUG ALLERGIES:  No Known Allergies  VITALS:  Blood pressure 132/68, pulse (!) 59, temperature 98.3 F (36.8 C), temperature source Oral, resp. rate 18, height 5\' 8"  (1.727 m), weight 77 kg, SpO2 95 %.  PHYSICAL EXAMINATION:  Physical Exam  GENERAL:  84 y.o.-year-old patient lying in the bed with no acute distress.  EYES: Pupils equal, round, reactive to light and accommodation. No scleral icterus. Extraocular muscles intact.  HEENT: Head atraumatic, normocephalic. Oropharynx and nasopharynx clear.  NECK:  Supple, no jugular venous distention. No thyroid enlargement, no tenderness.  LUNGS: Normal breath sounds bilaterally, no wheezing, rales,rhonchi or crepitation. No use of accessory muscles of respiration.  CARDIOVASCULAR: S1, S2 normal. No rubs, or gallops.  2/6 systolic murmur is  present ABDOMEN: Soft, nontender, nondistended. Bowel sounds present. No organomegaly or mass.  EXTREMITIES: No pedal edema, cyanosis, or clubbing.  Right hip lateral swelling noted.  Dressing in place with no bleeding. NEUROLOGIC: Cranial nerves II through XII are intact. Muscle strength 5/5 in all extremities. Sensation intact. Gait not checked.  PSYCHIATRIC: The patient is alert and oriented x 3.  SKIN: No obvious rash, lesion, or ulcer.    LABORATORY PANEL:   CBC Recent Labs  Lab 11/14/17 0419  WBC 8.9  HGB 11.0*  HCT 31.0*  PLT 185   ------------------------------------------------------------------------------------------------------------------  Chemistries  Recent Labs  Lab 11/14/17 0419  NA 131*  K 4.2  CL 101  CO2 26  GLUCOSE 137*  BUN 12  CREATININE 1.01  CALCIUM 8.8*   ------------------------------------------------------------------------------------------------------------------  Cardiac Enzymes Recent Labs  Lab 11/14/17 0419  TROPONINI <0.03   ------------------------------------------------------------------------------------------------------------------  RADIOLOGY:  Dg Pelvis Portable  Result Date: 11/13/2017 CLINICAL DATA:  Hip surgery this morning. Syncopal episode resulting in fall. EXAM: PORTABLE PELVIS 1-2 VIEWS COMPARISON:  Earlier same day FINDINGS: Right hip arthroplasty appears the same. No complication following the fall. The distal stem is not included on the image. No other pelvic fracture. IMPRESSION: No change. No sign of injury related to a fall. The distal stem is not included. Electronically Signed   By: Nelson Chimes M.D.   On: 11/13/2017 16:43   US Carotid Bilateral  Result Date: 11/14/2017 CLINICAL DATA:  Syncopal event.  History of hypertension. EXAM: BILATERAL CAROTID DUPLEX ULTRASOUND TECHNIQUE: Pearline Cables scale imaging, color Doppler and duplex ultrasound were performed of bilateral carotid and vertebral arteries in the neck.  COMPARISON:  None. FINDINGS: Criteria: Quantification of carotid stenosis is based on velocity parameters that  correlate the residual internal carotid diameter with NASCET-based stenosis levels, using the diameter of the distal internal carotid lumen as the denominator for stenosis measurement. The following velocity measurements were obtained: RIGHT ICA:  67/7 cm/sec CCA:  08/1 cm/sec SYSTOLIC ICA/CCA RATIO:  0.8 ECA:  149 cm/sec LEFT ICA:  63/13 cm/sec CCA:  44/8 cm/sec SYSTOLIC ICA/CCA RATIO:  0.8 ECA:  105 cm/sec RIGHT CAROTID ARTERY: Mild intimal thickening present in the common carotid artery and carotid bulb. There is a mild amount of eccentric partially calcified plaque in the right internal carotid artery. Velocities and waveforms are normal and estimated right ICA stenosis is less than 50%. RIGHT VERTEBRAL ARTERY: Antegrade flow with normal waveform and velocity. LEFT CAROTID ARTERY: Mild amount of partially calcified plaque is present at the level of the carotid bulb. No evidence of ICA plaque or stenosis. The left ICA is tortuous. LEFT VERTEBRAL ARTERY: Antegrade flow with normal waveform and velocity. IMPRESSION: 1. Mild plaque in the right internal carotid artery. Estimated right ICA stenosis is less than 50%. 2. No evidence of left ICA plaque or stenosis. Mild amount of plaque in the left carotid bulb. Electronically Signed   By: Aletta Edouard M.D.   On: 11/14/2017 07:54   Dg Hip Unilat W Or W/o Pelvis 2-3 Views Right  Result Date: 11/13/2017 CLINICAL DATA:  Status post right total hip joint prosthesis placement. EXAM: DG HIP (WITH OR WITHOUT PELVIS) 2-3V RIGHT COMPARISON:  Coronal and sagittal reconstructed images through the the pelvis from an abdominal and pelvic CT scan of January 24, 2017 FINDINGS: The patient has undergone right total hip joint prosthesis placement. Radiographic positioning of the prosthetic components is good. The interface with the native bone appears normal. No acute  native bone abnormality is observed. IMPRESSION: No immediate complication following right total hip joint prosthesis placement. Electronically Signed   By: David  Martinique M.D.   On: 11/13/2017 10:50    EKG:   Orders placed or performed during the hospital encounter of 11/13/17  . EKG 12-Lead  . EKG 12-Lead    ASSESSMENT AND PLAN:   84 year old male with past medical history significant for hyperparathyroidism, hypertension, arthritis and fibromyalgia admitted to orthopedic service for right hip surgery.  Medical consult requested for syncopal episode.  1.  Syncope-in the postop period, while trying to walk with PT - atleast a  30-40 sec syncope-no seizure activity noted.  Noted to be hypertensive after the event - No known cardiac history- could be vasovagal - carotid dopplers with no hemodynamically significant stenosis - ECHO is pending - no significant orthostatic BP changes, labs are within normal limits - troponins negative - continue to work with PT -If noted to be hypoxic or dyspneic or recurrent episodes-consider CT of the chest to rule out PE  2.  Acute urinary retention-started on Flomax.  In and out catheterization twice.  If continues to retain-we will replace Foley catheter  3.  Neuropathy-continue gabapentin and Lyrica  4.  GERD-Protonix  5.  DVT prophylaxis-Lovenox    All the records are reviewed and case discussed with Care Management/Social Workerr. Management plans discussed with the patient, family and they are in agreement.  CODE STATUS: Full code  TOTAL TIME TAKING CARE OF THIS PATIENT: 37 minutes.   POSSIBLE D/C IN 1-2 DAYS, DEPENDING ON CLINICAL CONDITION.   Gladstone Lighter M.D on 11/14/2017 at 9:59 AM  Between 7am to 6pm - Pager - [phone]  After 6pm go to [email or web address] - [place]  [place]  [phone]  CC: Primary care physician; Leone Haven, MD

## 2017-11-14 NOTE — Progress Notes (Signed)
Pt has voided 3 separate times about 300 cc each time since being I&O cathed this morning. Bladder scan around 6:15pm showed >999cc. I&O cath performed per hospitalist. 930 cc urine returned.    [place], Jerry Caras

## 2017-11-15 ENCOUNTER — Other Ambulatory Visit: Payer: Self-pay

## 2017-11-15 DIAGNOSIS — F5101 Primary insomnia: Secondary | ICD-10-CM

## 2017-11-15 LAB — URINALYSIS, COMPLETE (UACMP) WITH MICROSCOPIC
Bilirubin Urine: NEGATIVE
GLUCOSE, UA: NEGATIVE mg/dL
KETONES UR: NEGATIVE mg/dL
Nitrite: NEGATIVE
PH: 6 (ref 5.0–8.0)
Protein, ur: NEGATIVE mg/dL
Specific Gravity, Urine: 1.002 — ABNORMAL LOW (ref 1.005–1.030)

## 2017-11-15 LAB — CBC
HCT: 29.7 % — ABNORMAL LOW (ref 40.0–52.0)
HEMOGLOBIN: 10.7 g/dL — AB (ref 13.0–18.0)
MCH: 31.9 pg (ref 26.0–34.0)
MCHC: 36 g/dL (ref 32.0–36.0)
MCV: 88.6 fL (ref 80.0–100.0)
Platelets: 151 10*3/uL (ref 150–440)
RBC: 3.35 MIL/uL — AB (ref 4.40–5.90)
RDW: 13.4 % (ref 11.5–14.5)
WBC: 8.7 10*3/uL (ref 3.8–10.6)

## 2017-11-15 LAB — BASIC METABOLIC PANEL
ANION GAP: 6 (ref 5–15)
BUN: 10 mg/dL (ref 8–23)
CALCIUM: 9.4 mg/dL (ref 8.9–10.3)
CO2: 27 mmol/L (ref 22–32)
CREATININE: 1.04 mg/dL (ref 0.61–1.24)
Chloride: 102 mmol/L (ref 98–111)
GFR calc non Af Amer: >60 mL/min (ref >60)
Glucose, Bld: 121 mg/dL — ABNORMAL HIGH (ref 70–99)
Potassium: 3.9 mmol/L (ref 3.5–5.1)
SODIUM: 135 mmol/L (ref 135–145)

## 2017-11-15 LAB — SURGICAL PATHOLOGY

## 2017-11-15 MED ORDER — TRAMADOL HCL 50 MG PO TABS: 50.0000 mg | ORAL_TABLET | Freq: Four times a day (QID) | ORAL | 0 refills | Status: DC | PRN

## 2017-11-15 MED ORDER — ONDANSETRON HCL 4 MG PO TABS: 4.0000 mg | ORAL_TABLET | Freq: Three times a day (TID) | ORAL | 0 refills | Status: DC | PRN

## 2017-11-15 MED ORDER — OXYCODONE HCL 5 MG PO TABS: 5.0000 mg | ORAL_TABLET | ORAL | 0 refills | Status: DC | PRN

## 2017-11-15 MED ORDER — TAMSULOSIN HCL 0.4 MG PO CAPS: 0.4000 mg | ORAL_CAPSULE | Freq: Every day | ORAL | 0 refills | Status: DC

## 2017-11-15 MED ORDER — IBUPROFEN 400 MG PO TABS
400.0000 mg | ORAL_TABLET | Freq: Once | ORAL | Status: AC
  Administered 2017-11-15: 400 mg via ORAL
  Filled 2017-11-15: qty 1

## 2017-11-15 MED ORDER — ENOXAPARIN SODIUM 40 MG/0.4ML ~~LOC~~ SOLN: 40.0000 mg | SUBCUTANEOUS | 0 refills | Status: DC

## 2017-11-15 NOTE — Progress Notes (Signed)
  Subjective: 2 Days Post-Op Procedure(s) (LRB): TOTAL HIP ARTHROPLASTY (Right) Patient reports pain as mild.   Patient is well, lethargic this AM. PT and care management to assist with discharge planning. May need SNF tomorrow upon discharge. Negative for chest pain and shortness of breath Fever: no Gastrointestinal: Improved N/V today. Pt with acute urinary retention.  Objective: Vital signs in last 24 hours: Temp:  [97.6 F (36.4 C)-98.8 F (37.1 C)] 97.6 F (36.4 C) (08/28 2308) Pulse Rate:  [61-67] 67 (08/28 2308) Resp:  [18] 18 (08/28 2308) BP: (133-155)/(65-76) 138/65 (08/29 1023) SpO2:  [88 %-97 %] 95 % (08/29 0828)  Intake/Output from previous day:  Intake/Output Summary (Last 24 hours) at 11/15/2017 1330 Last data filed at 11/15/2017 1010 Gross per 24 hour  Intake 1475.86 ml  Output 2879 ml  Net -1403.14 ml    Intake/Output this shift: Total I/O In: 240 [P.O.:240] Out: -   Labs: Recent Labs    11/14/17 0419 11/15/17 0353  HGB 11.0* 10.7*   Recent Labs    11/14/17 0419 11/15/17 0353  WBC 8.9 8.7  RBC 3.44* 3.35*  HCT 31.0* 29.7*  PLT 185 151   Recent Labs    11/14/17 0419 11/15/17 0353  NA 131* 135  K 4.2 3.9  CL 101 102  CO2 26 27  BUN 12 10  CREATININE 1.01 1.04  GLUCOSE 137* 121*  CALCIUM 8.8* 9.4   No results for input(s): LABPT, INR in the last 72 hours.   EXAM General - Patient is Alert, Appropriate and Oriented Extremity - ABD soft Neurovascular intact Sensation intact distally Intact pulses distally Dorsiflexion/Plantar flexion intact Incision: dressing C/D/I No cellulitis present Dressing/Incision - clean, dry, no drainage Motor Function - intact, moving foot and toes well on exam.  Abdomen soft with normal BS at this time.  Past Medical History:  Diagnosis Date  . Arthritis   . Cancer ([place]) 2018   bladder cancer  . Complication of anesthesia    slow to get rid of anesthesia, over a couple of days.  .  Diverticulosis   . Fibromyalgia 1995  . Glaucoma    pt denies  . History of bladder cancer 2018  . History of kidney stones   . Hyperlipidemia   . Hyperparathyroidism ([place]) 10/2017  . Hypertension   . Positive TB test   . Tubulovillous adenoma of colon     Assessment/Plan: 2 Days Post-Op Procedure(s) (LRB): TOTAL HIP ARTHROPLASTY (Right) Active Problems:   Status post total hip replacement, right  Estimated body mass index is 25.82 kg/m as calculated from the following:   Height as of this encounter: 5\' 8"  (1.727 m).   Weight as of this encounter: 77 kg. Advance diet Up with therapy D/C IV fluids  When tolerating po intake.  Labs reviewed, Cardiac work-up negative. Acute urinary retention- current plan is for discharge with foley and follow-up in one week with urology.  Started on Flomax. Up with PT today.   Attempt to decrease pain medication today due to sedation. Continue to work on having a BM.  CBC and BMP ordered for tomorrow morning. Encouraged incentive spirometer. Plan will be for discharge tomorrow.  DVT Prophylaxis - Lovenox, Foot Pumps and TED hose Weight-Bearing as tolerated to right leg  J. Cameron Proud, PA-C [facility] Orthopaedic Surgery 11/15/2017, 1:30 PM

## 2017-11-15 NOTE — Progress Notes (Addendum)
Physical Therapy Treatment Patient Details Name: Aaron Quinn MRN: [id number] DOB: [birth year] Today's Date: 11/15/2017    History of Present Illness Pt underwent R THR posterior approach. PMH includes HTN, bladder CA, fibromyalgia, hyperlipidemia, HTN, and L partial knee replacement. PT evaluation performed on POD#0.    PT Comments    Pt initially stated he was feeling better this am but during session he seemed more lethargic and less energetic.  HR 80 at rest.  Pt on 2 lpm O2 with sats mid 90's.  O2 removed to test for qualifying O2 sats and remained low to mid 90's at rest. Pt required increased assist for bed mobility this am - mod assist.   Pt initially had difficulty maintaining upright posture but with cueing was able to gain balance.  Stood with min a x 1 and +1 assist for safety.  He was able to ambulate 56' with slow irregular gait pattern that required verbal cues to correct pattern and overall safety.  Gait quality decreasing with distance.  +2 assist for gait with wife following with recliner.  Pt was cued to sit at 68' for safety.  O2 sats were monitored throughout but once sitting noted to drop to 87% and O2 was reapplied and sats quickly returned to mid 90's. Heart rate did drop from 80 at start to 64 while sitting.  Session stopped and pt was given a ride back to his room and remained in recliner.  He was unable to complete stair training this am.    Discussed with primary PT prior to session.  Discussed session with Dr. Tressia Miners and Janett Billow RN after session.    HHPT was initially recommended.  Discussed with Crista Elliot and MD regarding option of SNF for rehab upon discharge if pt does not improve during PM session.  Will leave recommendation adjustments for PM session to see if he does better after lunch but SNF may be needed for a successful discharge due to continued concerns.     Follow Up Recommendations  Home health PT;Supervision/Assistance - 24 hour;Other (comment)      Equipment Recommendations  None recommended by PT    Recommendations for Other Services       Precautions / Restrictions Precautions Precautions: Fall;Posterior Hip Restrictions Weight Bearing Restrictions: Yes RLE Weight Bearing: Weight bearing as tolerated    Mobility  Bed Mobility Overal bed mobility: Needs Assistance Bed Mobility: Supine to Sit     Supine to sit: Mod assist     General bed mobility comments: increased assist today, some difficulty obtaining balance initially  Transfers Overall transfer level: Needs assistance   Transfers: Sit to/from Stand Sit to Stand: Min assist;+2 safety/equipment            Ambulation/Gait Ambulation/Gait assistance: Min guard;Min assist Gait Distance (Feet): 90 Feet Assistive device: Rolling walker (2 wheeled) Gait Pattern/deviations: Step-to pattern   Gait velocity interpretation: <1.8 ft/sec, indicate of risk for recurrent falls General Gait Details: Fatigued this am after 61' and limited by Probation officer.  Verbal cues for step pattern and walker use.   Stairs             Wheelchair Mobility    Modified Rankin (Stroke Patients Only)       Balance Overall balance assessment: Needs assistance Sitting-balance support: No upper extremity supported Sitting balance-Leahy Scale: Fair     Standing balance support: Bilateral upper extremity supported Standing balance-Leahy Scale: Fair  Cognition Arousal/Alertness: Lethargic Behavior During Therapy: WFL for tasks assessed/performed Overall Cognitive Status: Within Functional Limits for tasks assessed                                        Exercises      General Comments        Pertinent Vitals/Pain Pain Assessment: 0-10 Pain Score: 6  Pain Location: R hip Pain Descriptors / Indicators: Operative site guarding Pain Intervention(s): Limited activity within patient's tolerance;Monitored during  session    Home Living                      Prior Function            PT Goals (current goals can now be found in the care plan section) Progress towards PT goals: Progressing toward goals    Frequency    BID      PT Plan Current plan remains appropriate;Other (comment)    Co-evaluation              AM-PAC PT "6 Clicks" Daily Activity  Outcome Measure  Difficulty turning over in bed (including adjusting bedclothes, sheets and blankets)?: Unable Difficulty moving from lying on back to sitting on the side of the bed? : Unable Difficulty sitting down on and standing up from a chair with arms (e.g., wheelchair, bedside commode, etc,.)?: Unable Help needed moving to and from a bed to chair (including a wheelchair)?: A Little Help needed walking in hospital room?: A Little Help needed climbing 3-5 steps with a railing? : A Lot 6 Click Score: 11    End of Session Equipment Utilized During Treatment: Gait belt Activity Tolerance: Patient limited by fatigue;Patient limited by lethargy Patient left: in chair;with chair alarm set;with call bell/phone within reach;with family/visitor present Nurse Communication: Mobility status;Other (comment) Pain - Right/Left: Right Pain - part of body: Hip     Time: 6222-9798 PT Time Calculation (min) (ACUTE ONLY): 23 min  Charges:  $Gait Training: 8-22 mins $Therapeutic Exercise: 8-22 mins                     Chesley Noon, PTA 11/15/17, 9:52 AM

## 2017-11-15 NOTE — Progress Notes (Signed)
PT Cancellation Note  Patient Details Name: ZAELYN BARBARY MRN: [id number] DOB: [birth year]   Cancelled Treatment:    Reason Eval/Treat Not Completed: Fatigue/lethargy limiting ability to participate. Treatment attempted this afternoon; pt had returned to bed a short while ago after being up in the chair since early morning session. Pt/spouse note is extremely tired; pt continually falling asleep during conversation and very groggy while conversing. Pt agreeable to attempt PT and attempt to ambulate again; offered to begin with exercises/sitting edge of bed to allow for pt to awaken. Pt unable to awaken fully for any appreciable PT participation at this time. No further attempt and agreed to allow pt to rest and re attempt in the morning. Due to inconsistent ability to ambulate; inability to perform steps and increased assistance required for functional mobility recommending skilled nursing facility placement post acute care discharge to progress strength, endurance, quality of ambulation and safety; pt is unsafe to discharge home at this time.    Larae Grooms, PTA 11/15/2017, 3:10 PM

## 2017-11-15 NOTE — Discharge Instructions (Signed)
Instructions after Total Hip Replacement     J. Jeffrey Poggi, M.D.  J. Lance Dyllen Menning, PA-C     Dept. of Orthopaedics & Sports Medicine  [facility]  [address]  [place], [place]  [address]  Phone: [phone]   Fax: [phone]    DIET: . Drink plenty of non-alcoholic fluids. . Resume your normal diet. Include foods high in fiber.  ACTIVITY:  . You may use crutches or a walker with weight-bearing as tolerated, unless instructed otherwise. . You may be weaned off of the walker or crutches by your Physical Therapist.  . Do NOT reach below the level of your knees or cross your legs until allowed.    . Continue doing gentle exercises. Exercising will reduce the pain and swelling, increase motion, and prevent muscle weakness.   . Please continue to use the TED compression stockings for 6 weeks. You may remove the stockings at night, but should reapply them in the morning. . Do not drive or operate any equipment until instructed.  WOUND CARE:  . Continue to use ice packs periodically to reduce pain and swelling. . Keep the incision clean and dry. . You may bathe or shower after the staples are removed at the first office visit following surgery.  MEDICATIONS: . You may resume your regular medications. . Please take the pain medication as prescribed on the medication. . Do not take pain medication on an empty stomach. . You have been given a prescription for a blood thinner to prevent blood clots. Please take the medication as instructed. (NOTE: After completing a 2 week course of Lovenox, take one Enteric-coated aspirin once a day.) . Pain medications and iron supplements can cause constipation. Use a stool softener (Senokot or Colace) on a daily basis and a laxative (dulcolax or miralax) as needed. . Do not drive or drink alcoholic beverages when taking pain medications.  CALL THE OFFICE FOR: . Temperature above 101 degrees . Excessive bleeding or drainage on the  dressing. . Excessive swelling, coldness, or paleness of the toes. . Persistent nausea and vomiting.  FOLLOW-UP:  . You should have an appointment to return to the office in 2 weeks after surgery. . Arrangements have been made for continuation of Physical Therapy (either home therapy or outpatient therapy).  

## 2017-11-15 NOTE — Progress Notes (Signed)
Pt temp 103.5. 650mg  Tylenol given. An hour later temp 102.3. MD [facility] paged. Verbal orders to give one time dose of 400mg  Motrin, obtain blood cultures, and obtain UA.

## 2017-11-15 NOTE — Clinical Social Work Note (Signed)
Clinical Social Work Assessment  Patient Details  Name: Aaron Quinn MRN: [id number] Date of Birth: 06-15-1931  Date of referral:  11/15/17               Reason for consult:  Facility Placement                Permission sought to share information with:  Chartered certified accountant granted to share information::  Yes, Verbal Permission Granted  Name::      [place]::   [place]   Relationship::     Contact Information:     Housing/Transportation Living arrangements for the past 2 months:  [place] of Information:  Patient, Spouse Patient Interpreter Needed:  None Criminal Activity/Legal Involvement Pertinent to Current Situation/Hospitalization:  No - Comment as needed Significant Relationships:  Spouse Lives with:  Spouse Do you feel safe going back to the place where you live?  Yes Need for family participation in patient care:  Yes (Comment)  Care giving concerns:  Patient lives in [place] with his wife Aaron Quinn.    Social Worker assessment / plan:  Holiday representative (CSW) received verbal consult from PT today that recommendation is changing from home health to SNF. CSW met with patient and his wife Aaron Quinn was at bedside. Patient was alert and oriented X3 and was sitting up in the chair at bedside. CSW introduced self and explained role of CSW department. Per patient he lives in [place] with his wife. CSW explained SNF process and that [facility] will have to approve it. Patient and wife verbalized their understanding and are agreeable to SNF search in [place]. FL2 complete and faxed out.   CSW presented bed offers to patient and his wife. They chose [facility]. Per [facility] admissions coordinator at [facility] she will start [facility] SNF authorization. Per Magda Paganini PT is still recommending home health in today's PT note so [employer] may not approve SNF. CSW made PT aware of above. CSW will continue to follow and assist as  needed.    Employment status:  Retired Nurse, adult PT Recommendations:  [place], Home with [place] / Referral to community resources:  [place]  Patient/Family's Response to care:  Patient and is wife chose [facility].   Patient/Family's Understanding of and Emotional Response to Diagnosis, Current Treatment, and Prognosis:  Patient and his wife were very pleasant and thanked CSW for assistance.   Emotional Assessment Appearance:  Appears stated age Attitude/Demeanor/Rapport:    Affect (typically observed):  Accepting, Adaptable, Pleasant Orientation:  Oriented to Self, Oriented to Place, Oriented to  Time, Oriented to Situation Alcohol / Substance use:  Not Applicable Psych involvement (Current and /or in the community):  No (Comment)  Discharge Needs  Concerns to be addressed:  Discharge Planning Concerns Readmission within the last 30 days:  No Current discharge risk:  Dependent with Mobility Barriers to Discharge:  Continued Medical Work up   [employer], Veronia Beets, LCSW 11/15/2017, 2:36 PM

## 2017-11-15 NOTE — Progress Notes (Signed)
Patient notified on vmail 

## 2017-11-15 NOTE — Progress Notes (Addendum)
[place] [facility] [place] NAME: Aaron Quinn    MR#:  [id number]  DATE OF BIRTH:  June 25, 1931  SUBJECTIVE:  CHIEF COMPLAINT:  No chief complaint on file.  -Postop day 2 after right total hip arthroplasty.   -Sluggish to respond due to pain medications likely.  Acute urinary retention and now has Foley catheter.  REVIEW OF SYSTEMS:  Review of Systems  Constitutional: Negative for chills and fever.  HENT: Negative for congestion, ear discharge, hearing loss and nosebleeds.   Eyes: Negative for blurred vision and double vision.  Respiratory: Negative for cough, shortness of breath and wheezing.   Cardiovascular: Negative for chest pain, palpitations and leg swelling.  Gastrointestinal: Negative for abdominal pain, constipation, diarrhea, nausea and vomiting.  Genitourinary: Negative for dysuria.       Urinary retention  Musculoskeletal: Positive for myalgias.  Skin: Negative for rash.  Neurological: Positive for dizziness. Negative for speech change, focal weakness, seizures, weakness and headaches.  Psychiatric/Behavioral: Negative for depression.    DRUG ALLERGIES:  No Known Allergies  VITALS:  Blood pressure (!) 155/76, pulse 67, temperature 97.6 F (36.4 C), temperature source Oral, resp. rate 18, height 5\' 8"  (1.727 m), weight 77 kg, SpO2 95 %.  PHYSICAL EXAMINATION:  Physical Exam  GENERAL:  84 y.o.-year-old patient lying in the bed with no acute distress.  EYES: Pupils equal, round, reactive to light and accommodation. No scleral icterus. Extraocular muscles intact.  HEENT: Head atraumatic, normocephalic. Oropharynx and nasopharynx clear.  NECK:  Supple, no jugular venous distention. No thyroid enlargement, no tenderness.  LUNGS: Normal breath sounds bilaterally, no wheezing, rales,rhonchi or crepitation. No use of accessory muscles of respiration.  CARDIOVASCULAR: S1, S2 normal. No rubs, or gallops.  2/6 systolic murmur is  present ABDOMEN: Soft, nontender, nondistended. Bowel sounds present. No organomegaly or mass.  EXTREMITIES: No pedal edema, cyanosis, or clubbing.  Right hip lateral swelling noted.  Dressing in place with no bleeding. NEUROLOGIC: Cranial nerves II through XII are intact. Muscle strength 5/5 in all extremities. Sensation intact. Gait not checked.  PSYCHIATRIC: The patient is alert and oriented x 2-3. Intermittent confusion noted SKIN: No obvious rash, lesion, or ulcer.    LABORATORY PANEL:   CBC Recent Labs  Lab 11/15/17 0353  WBC 8.7  HGB 10.7*  HCT 29.7*  PLT 151   ------------------------------------------------------------------------------------------------------------------  Chemistries  Recent Labs  Lab 11/15/17 0353  NA 135  K 3.9  CL 102  CO2 27  GLUCOSE 121*  BUN 10  CREATININE 1.04  CALCIUM 9.4   ------------------------------------------------------------------------------------------------------------------  Cardiac Enzymes Recent Labs  Lab 11/14/17 0419  TROPONINI <0.03   ------------------------------------------------------------------------------------------------------------------  RADIOLOGY:  Ct Angio Chest Pe W Or Wo Contrast  Result Date: 11/14/2017 CLINICAL DATA:  84 year old male with a history of syncope EXAM: CT ANGIOGRAPHY CHEST WITH CONTRAST TECHNIQUE: Multidetector CT imaging of the chest was performed using the standard protocol during bolus administration of intravenous contrast. Multiplanar CT image reconstructions and MIPs were obtained to evaluate the vascular anatomy. CONTRAST:  92mL OMNIPAQUE IOHEXOL 350 MG/ML SOLN COMPARISON:  None. FINDINGS: Cardiovascular: Heart: No cardiomegaly. No pericardial fluid/thickening. Calcifications of the left anterior descending, circumflex, right coronary arteries. Aorta: Atherosclerotic changes of the aortic arch and descending thoracic aorta. Greatest diameter ascending aorta measures 4.2 cm. No  dissection or periaortic fluid. Pulmonary arteries: No central, lobar, segmental, or proximal subsegmental filling defects. Mediastinum/Nodes: No mediastinal adenopathy. Unremarkable appearance of the thoracic esophagus. Unremarkable appearance of  the thoracic inlet and thyroid. Lungs/Pleura: Central airways are clear. No pleural effusion. No confluent airspace disease. No pneumothorax. Calcified granuloma at the apex of the left lung. Linear scarring/atelectasis at the right greater than left lung apex. Atelectasis at the dependent lung bases. Centrilobular nodularity of the lingula. Upper Abdomen: Calcifications within the liver parenchyma. Musculoskeletal: No acute displaced fracture. Degenerative changes of the spine. Review of the MIP images confirms the above findings. . IMPRESSION: CT is negative for pulmonary emboli. Ascending aorta measures 4.2 cm. Recommend annual imaging followup by CTA or MRA. This recommendation follows 2010 ACCF/AHA/AATS/ACR/ASA/SCA/SCAI/SIR/STS/SVM Guidelines for the Diagnosis and Management of Patients with Thoracic Aortic Disease. Circulation. 2010; 121: J188-C166 Atherosclerotic changes of the thoracic aorta, with associated coronary atherosclerosis. Aortic Atherosclerosis (ICD10-I70.0). Minimal centrilobular nodularity of the lingula, potentially inflammatory/infectious. Evidence of prior granulomatous disease. Electronically Signed   By: Corrie Mckusick D.O.   On: 11/14/2017 12:35   Dg Pelvis Portable  Result Date: 11/13/2017 CLINICAL DATA:  Hip surgery this morning. Syncopal episode resulting in fall. EXAM: PORTABLE PELVIS 1-2 VIEWS COMPARISON:  Earlier same day FINDINGS: Right hip arthroplasty appears the same. No complication following the fall. The distal stem is not included on the image. No other pelvic fracture. IMPRESSION: No change. No sign of injury related to a fall. The distal stem is not included. Electronically Signed   By: Nelson Chimes M.D.   On: 11/13/2017 16:43    US Carotid Bilateral  Result Date: 11/14/2017 CLINICAL DATA:  Syncopal event.  History of hypertension. EXAM: BILATERAL CAROTID DUPLEX ULTRASOUND TECHNIQUE: Pearline Cables scale imaging, color Doppler and duplex ultrasound were performed of bilateral carotid and vertebral arteries in the neck. COMPARISON:  None. FINDINGS: Criteria: Quantification of carotid stenosis is based on velocity parameters that correlate the residual internal carotid diameter with NASCET-based stenosis levels, using the diameter of the distal internal carotid lumen as the denominator for stenosis measurement. The following velocity measurements were obtained: RIGHT ICA:  67/7 cm/sec CCA:  06/3 cm/sec SYSTOLIC ICA/CCA RATIO:  0.8 ECA:  149 cm/sec LEFT ICA:  63/13 cm/sec CCA:  01/6 cm/sec SYSTOLIC ICA/CCA RATIO:  0.8 ECA:  105 cm/sec RIGHT CAROTID ARTERY: Mild intimal thickening present in the common carotid artery and carotid bulb. There is a mild amount of eccentric partially calcified plaque in the right internal carotid artery. Velocities and waveforms are normal and estimated right ICA stenosis is less than 50%. RIGHT VERTEBRAL ARTERY: Antegrade flow with normal waveform and velocity. LEFT CAROTID ARTERY: Mild amount of partially calcified plaque is present at the level of the carotid bulb. No evidence of ICA plaque or stenosis. The left ICA is tortuous. LEFT VERTEBRAL ARTERY: Antegrade flow with normal waveform and velocity. IMPRESSION: 1. Mild plaque in the right internal carotid artery. Estimated right ICA stenosis is less than 50%. 2. No evidence of left ICA plaque or stenosis. Mild amount of plaque in the left carotid bulb. Electronically Signed   By: Aletta Edouard M.D.   On: 11/14/2017 07:54   Dg Hip Unilat W Or W/o Pelvis 2-3 Views Right  Result Date: 11/13/2017 CLINICAL DATA:  Status post right total hip joint prosthesis placement. EXAM: DG HIP (WITH OR WITHOUT PELVIS) 2-3V RIGHT COMPARISON:  Coronal and sagittal reconstructed  images through the the pelvis from an abdominal and pelvic CT scan of January 24, 2017 FINDINGS: The patient has undergone right total hip joint prosthesis placement. Radiographic positioning of the prosthetic components is good. The interface with the native bone appears  normal. No acute native bone abnormality is observed. IMPRESSION: No immediate complication following right total hip joint prosthesis placement. Electronically Signed   By: David  Martinique M.D.   On: 11/13/2017 10:50    EKG:   Orders placed or performed during the hospital encounter of 11/13/17  . EKG 12-Lead  . EKG 12-Lead    ASSESSMENT AND PLAN:   84 year old male with past medical history significant for hyperparathyroidism, hypertension, arthritis and fibromyalgia admitted to orthopedic service for right hip surgery.  Medical consult requested for syncopal episode.  1.  Syncope-in the postop period, while trying to walk with PT - atleast a  30-40 sec syncope-no seizure activity noted.  Noted to be hypertensive after the event - No known cardiac history- could be vasovagal - carotid dopplers with no hemodynamically significant stenosis - ECHO is normal with slightly elevated pulmonary artery pressures.  EF is within normal limits and no wall motion abnormalities noted. -CT angiogram negative for any pulmonary embolism - no significant orthostatic BP changes now, labs are within normal limits - troponins negative -No further work-up necessary.  2.  Acute urinary retention-started on Flomax.  In and out catheterization done thrice and still retaining almost a liter of urine.  Foley catheter is placed. -Patient follows with a urologist as outpatient-get an appointment within 1 week for a voiding trial in the office  3.  Neuropathy-continue gabapentin and Lyrica  4.  GERD-Protonix  5.  DVT prophylaxis-Lovenox  6.  Hypoxia-requiring 2 L oxygen acutely.  Likely atelectasis and hypoventilation.  Encourage incentive  spirometry and wean off oxygen as tolerated.  CT angiogram is negative  Medically stable for discharge either later today or tomorrow   All the records are reviewed and case discussed with Care Management/Social Workerr. Management plans discussed with the patient, family and they are in agreement.  CODE STATUS: Full code  TOTAL TIME TAKING CARE OF THIS PATIENT: 36 minutes.   POSSIBLE D/C IN 1-2 DAYS, DEPENDING ON CLINICAL CONDITION.   Gladstone Lighter M.D on 11/15/2017 at 8:49 AM  Between 7am to 6pm - Pager - [phone]  After 6pm go to [email or web address] - password [email or web address] [facility]  Office  [phone]  CC: Primary care physician; Leone Haven, MD

## 2017-11-15 NOTE — Telephone Encounter (Signed)
Last OV 08/15/17 last filled today

## 2017-11-15 NOTE — Progress Notes (Signed)
Patient had bladder scan of over 900.  Per order foley in place

## 2017-11-15 NOTE — Clinical Social Work Placement (Signed)
   CLINICAL SOCIAL WORK PLACEMENT  NOTE  Date:  11/15/2017  Patient Details  Name: Aaron Quinn MRN: [id number] Date of Birth: 1931-03-28  Clinical Social Work is seeking post-discharge placement for this patient at the [place] level of care (*CSW will initial, date and re-position this form in  chart as items are completed):  Yes   Patient/family provided with [place] Work Department's list of facilities offering this level of care within the geographic area requested by the patient (or if unable, by the patient's family).  Yes   Patient/family informed of their freedom to choose among providers that offer the needed level of care, that participate in Medicare, Medicaid or managed care program needed by the patient, have an available bed and are willing to accept the patient.  Yes   Patient/family informed of [facility]'s ownership interest in [facility] and [facility], as well as of the fact that they are under no obligation to receive care at these facilities.  PASRR submitted to EDS on 11/13/17     PASRR number received on 11/13/17     Existing PASRR number confirmed on       FL2 transmitted to all facilities in geographic area requested by pt/family on 11/15/17     FL2 transmitted to all facilities within larger geographic area on       Patient informed that his/her managed care company has contracts with or will negotiate with certain facilities, including the following:        Yes   Patient/family informed of bed offers received.  Patient chooses bed at [facility] )     Physician recommends and patient chooses bed at      Patient to be transferred to   on  .  Patient to be transferred to facility by       Patient family notified on   of transfer.  Name of family member notified:        PHYSICIAN       Additional Comment:    _______________________________________________ Kyden Potash, Veronia Beets,  LCSW 11/15/2017, 2:35 PM

## 2017-11-15 NOTE — Progress Notes (Signed)
Urine cytology was negative for malignant cells

## 2017-11-15 NOTE — Plan of Care (Signed)
  Problem: Education: Goal: Knowledge of General Education information will improve Description Including pain rating scale, medication(s)/side effects and non-pharmacologic comfort measures Outcome: Progressing   

## 2017-11-15 NOTE — Plan of Care (Signed)
  Problem: Education: Goal: Knowledge of General Education information will improve Description Including pain rating scale, medication(s)/side effects and non-pharmacologic comfort measures Outcome: Progressing   Problem: Health Behavior/Discharge Planning: Goal: Ability to manage health-related needs will improve Outcome: Progressing   Problem: Clinical Measurements: Goal: Ability to maintain clinical measurements within normal limits will improve Outcome: Progressing Goal: Will remain free from infection Outcome: Progressing Goal: Diagnostic test results will improve Outcome: Progressing Goal: Respiratory complications will improve Outcome: Progressing Goal: Cardiovascular complication will be avoided Outcome: Progressing   Problem: Activity: Goal: Risk for activity intolerance will decrease Outcome: Progressing   Problem: Nutrition: Goal: Adequate nutrition will be maintained Outcome: Progressing   Problem: Coping: Goal: Level of anxiety will decrease Outcome: Progressing   Problem: Elimination: Goal: Will not experience complications related to bowel motility Outcome: Progressing Goal: Will not experience complications related to urinary retention Outcome: Not Progressing   Problem: Pain Managment: Goal: General experience of comfort will improve Outcome: Progressing   Problem: Safety: Goal: Ability to remain free from injury will improve Outcome: Progressing   Problem: Skin Integrity: Goal: Risk for impaired skin integrity will decrease Outcome: Progressing   Problem: Education: Goal: Knowledge of the prescribed therapeutic regimen will improve Outcome: Progressing Goal: Understanding of discharge needs will improve Outcome: Progressing Goal: Individualized Educational Video(s) Outcome: Progressing   Problem: Activity: Goal: Ability to avoid complications of mobility impairment will improve Outcome: Progressing Goal: Ability to tolerate increased  activity will improve Outcome: Progressing   Problem: Clinical Measurements: Goal: Postoperative complications will be avoided or minimized Outcome: Progressing

## 2017-11-16 ENCOUNTER — Ambulatory Visit: Payer: Medicare Other | Admitting: Family Medicine

## 2017-11-16 LAB — CBC
HCT: 30.1 % — ABNORMAL LOW (ref 40.0–52.0)
Hemoglobin: 10.4 g/dL — ABNORMAL LOW (ref 13.0–18.0)
MCH: 31 pg (ref 26.0–34.0)
MCHC: 34.7 g/dL (ref 32.0–36.0)
MCV: 89.6 fL (ref 80.0–100.0)
PLATELETS: 153 10*3/uL (ref 150–440)
RBC: 3.36 MIL/uL — ABNORMAL LOW (ref 4.40–5.90)
RDW: 13.2 % (ref 11.5–14.5)
WBC: 6.7 10*3/uL (ref 3.8–10.6)

## 2017-11-16 LAB — BASIC METABOLIC PANEL
ANION GAP: 3 — AB (ref 5–15)
BUN: 12 mg/dL (ref 8–23)
CALCIUM: 9.6 mg/dL (ref 8.9–10.3)
CO2: 30 mmol/L (ref 22–32)
CREATININE: 0.97 mg/dL (ref 0.61–1.24)
Chloride: 104 mmol/L (ref 98–111)
GFR calc Af Amer: >60 mL/min (ref >60)
Glucose, Bld: 102 mg/dL — ABNORMAL HIGH (ref 70–99)
Potassium: 3.9 mmol/L (ref 3.5–5.1)
Sodium: 137 mmol/L (ref 135–145)

## 2017-11-16 MED ORDER — NITROFURANTOIN MONOHYD MACRO 100 MG PO CAPS
100.0000 mg | ORAL_CAPSULE | Freq: Two times a day (BID) | ORAL | Status: DC
  Administered 2017-11-16: 100 mg via ORAL
  Filled 2017-11-16 (×2): qty 1

## 2017-11-16 MED ORDER — NITROFURANTOIN MONOHYD MACRO 100 MG PO CAPS: 100.0000 mg | ORAL_CAPSULE | Freq: Two times a day (BID) | ORAL | 0 refills | Status: DC

## 2017-11-16 NOTE — Progress Notes (Signed)
Pt ready for d/c home today per MD. Pt met all PT goals to go home, [place] PA made aware. Pt has had multiple BMs. Pt's wife, Margaretha Sheffield, showed how to provide foley care, standard drainage bag changed to leg bag prior to d/c. Reviewed discharge instructions and prescriptions with pt and Margaretha Sheffield, all questions answered. PIV removed, VSS. Pt assisted to car by NT.   [place], Jerry Caras

## 2017-11-16 NOTE — Progress Notes (Signed)
[place] [facility] [place] NAME: Aaron Quinn    MR#:  [id number]  DATE OF BIRTH:  02-Mar-1932  SUBJECTIVE:  CHIEF COMPLAINT:  No chief complaint on file.  -Postop day 3 after right total hip arthroplasty.   -Alert and oriented today.  Worked well with physical therapy.  Had a fever of 103 F last night  REVIEW OF SYSTEMS:  Review of Systems  Constitutional: Positive for fever. Negative for chills.  HENT: Negative for congestion, ear discharge, hearing loss and nosebleeds.   Eyes: Negative for blurred vision and double vision.  Respiratory: Negative for cough, shortness of breath and wheezing.   Cardiovascular: Negative for chest pain, palpitations and leg swelling.  Gastrointestinal: Negative for abdominal pain, constipation, diarrhea, nausea and vomiting.  Genitourinary: Negative for dysuria.       Urinary retention  Musculoskeletal: Positive for myalgias.  Skin: Negative for rash.  Neurological: Positive for dizziness. Negative for speech change, focal weakness, seizures, weakness and headaches.  Psychiatric/Behavioral: Negative for depression.    DRUG ALLERGIES:  No Known Allergies  VITALS:  Blood pressure 138/64, pulse 65, temperature 97.8 F (36.6 C), temperature source Oral, resp. rate 16, height 5\' 8"  (1.727 m), weight 77 kg, SpO2 96 %.  PHYSICAL EXAMINATION:  Physical Exam  GENERAL:  84 y.o.-year-old patient lying in the bed with no acute distress.  EYES: Pupils equal, round, reactive to light and accommodation. No scleral icterus. Extraocular muscles intact.  HEENT: Head atraumatic, normocephalic. Oropharynx and nasopharynx clear.  NECK:  Supple, no jugular venous distention. No thyroid enlargement, no tenderness.  LUNGS: Normal breath sounds bilaterally, no wheezing, rales,rhonchi or crepitation. No use of accessory muscles of respiration.  CARDIOVASCULAR: S1, S2 normal. No rubs, or gallops.  2/6 systolic murmur is  present ABDOMEN: Soft, nontender, nondistended. Bowel sounds present. No organomegaly or mass.  EXTREMITIES: No pedal edema, cyanosis, or clubbing.  Right hip lateral swelling noted.  Dressing in place with no bleeding. NEUROLOGIC: Cranial nerves II through XII are intact. Muscle strength 5/5 in all extremities. Sensation intact. Gait not checked.  PSYCHIATRIC: The patient is alert and oriented x 3 SKIN: No obvious rash, lesion, or ulcer.    LABORATORY PANEL:   CBC Recent Labs  Lab 11/16/17 0405  WBC 6.7  HGB 10.4*  HCT 30.1*  PLT 153   ------------------------------------------------------------------------------------------------------------------  Chemistries  Recent Labs  Lab 11/16/17 0405  NA 137  K 3.9  CL 104  CO2 30  GLUCOSE 102*  BUN 12  CREATININE 0.97  CALCIUM 9.6   ------------------------------------------------------------------------------------------------------------------  Cardiac Enzymes Recent Labs  Lab 11/14/17 0419  TROPONINI <0.03   ------------------------------------------------------------------------------------------------------------------  RADIOLOGY:  No results found.  EKG:   Orders placed or performed during the hospital encounter of 11/13/17  . EKG 12-Lead  . EKG 12-Lead    ASSESSMENT AND PLAN:   84 year old male with past medical history significant for hyperparathyroidism, hypertension, arthritis and fibromyalgia admitted to orthopedic service for right hip surgery.  Medical consult requested for syncopal episode.  1.  Syncope-in the postop period, likely vasovagal syncope. -While trying to walk with PT - atleast a  30-40 sec syncope-no seizure activity noted.  Noted to be hypertensive after the event - No known cardiac history- could be vasovagal - carotid dopplers with no hemodynamically significant stenosis - ECHO is normal with slightly elevated pulmonary artery pressures.  EF is within normal limits and no wall  motion abnormalities noted. -CT angiogram negative for  any pulmonary embolism - no significant orthostatic BP changes now, labs are within normal limits - troponins negative -No further work-up necessary.  2.  Acute urinary retention-started on Flomax.  In and out catheterization done thrice and still retaining almost a liter of urine.  Foley catheter is placed. -Patient follows with a urologist as outpatient- appointment within 1 week for a voiding trial in the office  3.  Neuropathy-continue gabapentin and Lyrica  4.  GERD-Protonix  5.  DVT prophylaxis-Lovenox  6.  Hypoxia-likely atelectasis.  Encourage incentive spirometry.  Weaned off oxygen now.  CT angiogram is negative -One episode of fever yesterday.  Cultures are negative so far.  Urine analysis with some bacteria, likely from the Foley catheter.  Started on Macrobid.  Medically stable for discharge today.  Worked with physical therapy and they have recommended home health   All the records are reviewed and case discussed with Care Management/Social Workerr. Management plans discussed with the patient, family and they are in agreement.  CODE STATUS: Full code  TOTAL TIME TAKING CARE OF THIS PATIENT: 36 minutes.   POSSIBLE D/C IN 1-2 DAYS, DEPENDING ON CLINICAL CONDITION.   Aaron Quinn M.D on 11/16/2017 at 12:48 PM  Between 7am to 6pm - Pager - [phone]  After 6pm go to [email or web address] - password [email or web address] [facility]  Office  [phone]  CC: Primary care physician; Leone Haven, MD

## 2017-11-16 NOTE — Plan of Care (Signed)

## 2017-11-16 NOTE — Care Management Note (Signed)
Case Management Note  Patient Details  Name: Aaron Quinn MRN: [id number] Date of Birth: [birth year]  Subjective/Objective:  POD # 3 THA (R). Spoke with patient to discuss discharge planning. He lives with his wife. He has a walker. He will be going home with HHPT. Offered a list of agencies. Referral to [facility] for HHPT. Wife has taken Lovenox prescription to pharmacy to get it filled.                  Action/Plan: [facility] for HHPT. NO DME  Expected Discharge Date:  11/16/17               Expected Discharge Plan:     In-House Referral:     Discharge planning Services  CM Consult  Post Acute Care Choice:  Home Health Choice offered to:  Patient  DME Arranged:    DME Agency:     HH Arranged:  PT [place]:  [facility] (formerly [employer])  Status of Service:  Completed, signed off  If discussed at [employer], dates discussed:    Additional Comments:  Jolly Mango, RN 11/16/2017, 12:14 PM

## 2017-11-16 NOTE — Progress Notes (Signed)
Physical Therapy Treatment Patient Details Name: Aaron Quinn MRN: [id number] DOB: 03/30/1931 Today's Date: 11/16/2017    History of Present Illness Pt underwent R THR posterior approach. PMH includes HTN, bladder CA, fibromyalgia, hyperlipidemia, HTN, and L partial knee replacement. PT evaluation performed on POD#0.    PT Comments    Pt presents with deficits in strength, transfers, mobility, gait, balance, and activity tolerance but made good progress towards goals this date.  Pt demonstrated good RLE control and effort during sup to/from sit and required SBA only along with min verbal cues for sequencing during transfer training from various surfaces.  Pt was able to amb 1 x 100' and 1 x 150' with a RW with gait quality improving significantly during the session.  Pt initially was ambulating with step-to pattern with the RW pausing after every steps and by the end of the session pt transitioned to step-through pattern with no RW pausing.  Pt was steady ascending and descending steps both with B rails and with only one rail.  Pt and spouse also state that they have ramp access into the home if needed.  Pt will benefit from HHPT services upon discharge to safely address above deficits for decreased caregiver assistance and eventual return to PLOF.     Follow Up Recommendations  Home health PT;Supervision for mobility/OOB     Equipment Recommendations       Recommendations for Other Services       Precautions / Restrictions Precautions Precautions: Fall;Posterior Hip Precaution Booklet Issued: Yes (comment) Restrictions Weight Bearing Restrictions: Yes RLE Weight Bearing: Weight bearing as tolerated    Mobility  Bed Mobility Overal bed mobility: Modified Independent Bed Mobility: Supine to Sit;Sit to Supine           General bed mobility comments: Min extra time required for bed mobility tasks but no physical assistance needed  Transfers Overall transfer level: Needs  assistance Equipment used: Rolling walker (2 wheeled) Transfers: Sit to/from Stand Sit to Stand: Supervision         General transfer comment: Min verbal cues for sequencing during transfers from various surfaces but pt demonstrated good carryover  Ambulation/Gait Ambulation/Gait assistance: Min guard Gait Distance (Feet): 150 Feet Assistive device: Rolling walker (2 wheeled) Gait Pattern/deviations: Step-through pattern;Decreased step length - right;Decreased step length - left;Decreased stance time - right     General Gait Details: Step-to pattern that progressed to step-through pattern with slow cadence but steady without LOB   Stairs Stairs: Yes Stairs assistance: Min guard Stair Management: One rail Right;Two rails   General stair comments: Ascend/descend 2 steps with B rails and then with BUEs on the R rail with pt/spouse training on proper sequencing; per pt/spouse they have the option of a ramp to enter home if needed   Wheelchair Mobility    Modified Rankin (Stroke Patients Only)       Balance Overall balance assessment: Needs assistance Sitting-balance support: No upper extremity supported;Feet supported Sitting balance-Leahy Scale: Good     Standing balance support: Bilateral upper extremity supported Standing balance-Leahy Scale: Good                              Cognition Arousal/Alertness: Awake/alert Behavior During Therapy: WFL for tasks assessed/performed Overall Cognitive Status: Within Functional Limits for tasks assessed  Exercises Total Joint Exercises Ankle Circles/Pumps: AROM;Both;10 reps;5 reps Quad Sets: Strengthening;Both;5 reps;10 reps Gluteal Sets: Strengthening;Both;5 reps;10 reps Long Arc Quad: AROM;Both;10 reps;15 reps Knee Flexion: AROM;Both;10 reps;15 reps Marching in Standing: AROM;Both;10 reps;Standing Other Exercises Other Exercises: R turn training during  amb to prevent CKC R hip IR Other Exercises: R posterior hip precaution education/review with pt and spouse    General Comments        Pertinent Vitals/Pain Pain Assessment: 0-10 Pain Score: 4  Pain Location: R hip Pain Descriptors / Indicators: Sore Pain Intervention(s): Premedicated before session;Monitored during session    Home Living                      Prior Function            PT Goals (current goals can now be found in the care plan section) Progress towards PT goals: Progressing toward goals    Frequency    BID      PT Plan Current plan remains appropriate    Co-evaluation              AM-PAC PT "6 Clicks" Daily Activity  Outcome Measure                   End of Session Equipment Utilized During Treatment: Gait belt Activity Tolerance: Patient tolerated treatment well Patient left: Other (comment)(Pt left toileting with spouse with CNA notified) Nurse Communication: Mobility status PT Visit Diagnosis: Muscle weakness (generalized) (M62.81);Pain;Other abnormalities of gait and mobility (R26.89) Pain - Right/Left: Right Pain - part of body: Hip     Time: 3474-2595 PT Time Calculation (min) (ACUTE ONLY): 40 min  Charges:  $Gait Training: 8-22 mins $Therapeutic Exercise: 8-22 mins $Therapeutic Activity: 8-22 mins                     D. Scott Verlie Liotta PT, DPT 11/16/17, 10:42 AM

## 2017-11-16 NOTE — Progress Notes (Signed)
Subjective: 3 Days Post-Op Procedure(s) (LRB): TOTAL HIP ARTHROPLASTY (Right) Patient reports pain as mild.   Patient is well, did run fevers last night, UA shows signs of UTI. Plan will be for discharge to SNF today. Negative for chest pain and shortness of breath Fever: 103 last night. Gastrointestinal: Improved N/V today. Pt with acute urinary retention, still has foley.  Objective: Vital signs in last 24 hours: Temp:  [97.8 F (36.6 C)-103.1 F (39.5 C)] 97.8 F (36.6 C) (08/29 2335) Pulse Rate:  [52-76] 52 (08/29 2335) Resp:  [16] 16 (08/29 2335) BP: (133-148)/(62-67) 133/67 (08/29 2335) SpO2:  [88 %-98 %] 97 % (08/29 2335)  Intake/Output from previous day:  Intake/Output Summary (Last 24 hours) at 11/16/2017 0755 Last data filed at 11/16/2017 0400 Gross per 24 hour  Intake 960 ml  Output 2300 ml  Net -1340 ml    Intake/Output this shift: No intake/output data recorded.  Labs: Recent Labs    11/14/17 0419 11/15/17 0353 11/16/17 0405  HGB 11.0* 10.7* 10.4*   Recent Labs    11/15/17 0353 11/16/17 0405  WBC 8.7 6.7  RBC 3.35* 3.36*  HCT 29.7* 30.1*  PLT 151 153   Recent Labs    11/15/17 0353 11/16/17 0405  NA 135 137  K 3.9 3.9  CL 102 104  CO2 27 30  BUN 10 12  CREATININE 1.04 0.97  GLUCOSE 121* 102*  CALCIUM 9.4 9.6   No results for input(s): LABPT, INR in the last 72 hours.   EXAM General - Patient is Alert, Appropriate and Oriented Extremity - ABD soft Neurovascular intact Sensation intact distally Intact pulses distally Dorsiflexion/Plantar flexion intact Incision: dressing C/D/I No cellulitis present Dressing/Incision - clean, dry, no drainage Motor Function - intact, moving foot and toes well on exam.  Abdomen soft with normal BS at this time.  Past Medical History:  Diagnosis Date  . Arthritis   . Cancer ([place]) 2018   bladder cancer  . Complication of anesthesia    slow to get rid of anesthesia, over a couple of days.  .  Diverticulosis   . Fibromyalgia 1995  . Glaucoma    pt denies  . History of bladder cancer 2018  . History of kidney stones   . Hyperlipidemia   . Hyperparathyroidism ([place]) 10/2017  . Hypertension   . Positive TB test   . Tubulovillous adenoma of colon     Assessment/Plan: 3 Days Post-Op Procedure(s) (LRB): TOTAL HIP ARTHROPLASTY (Right) Active Problems:   Status post total hip replacement, right  Estimated body mass index is 25.82 kg/m as calculated from the following:   Height as of this encounter: 5\' 8"  (1.727 m).   Weight as of this encounter: 77 kg. Advance diet Up with therapy D/C IV fluids  When tolerating po intake.  Labs reviewed, Cardiac work-up negative. Acute urinary retention- current plan is for discharge with foley and follow-up in one week with urology.  Started on Flomax. UA demonstrates signs of UTI, will place on antibiotics for discharge, likely source of recent fevers. WBC 6.7 this AM. Up with PT today.  Plan is for discharge to SNF today. Encouraged incentive spirometer. Plan will be for discharge later this afternoon.  DVT Prophylaxis - Lovenox, Foot Pumps and TED hose Weight-Bearing as tolerated to right leg  J. Cameron Proud, PA-C [facility] 11/16/2017, 7:55 AM

## 2017-11-16 NOTE — Progress Notes (Signed)
Patient did well with PT this morning and recommendation is now home health. Clinical Social Worker (CSW) met with patient and his wife Margaretha Sheffield at bedside and made them aware of above. CSW explained that [facility] will not pay for SNF however they can pay out of pocket for SNF. Per patient and his wife, patient will go home with home health today. RN case manager aware of above. Please reconsult if future social work needs arise. CSW signing off.   [employer], LCSW [phone]

## 2017-11-16 NOTE — Discharge Summary (Signed)
Physician Discharge Summary  Patient ID: Aaron Quinn MRN: [id number] DOB/AGE: February 03, 1932 84 y.o.  Admit date: 11/13/2017 Discharge date: 11/16/2017  Admission Diagnoses:  PRIMARY OSTEOARTHRITIS OF RIGHT HIP  Discharge Diagnoses: Patient Active Problem List   Diagnosis Date Noted  . 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 Rash 06/11/2017  . Bruising 04/20/2017  . Urothelial carcinoma of bladder ([place]) 02/22/2017  . Sciatica 01/17/2017  . Hematuria 12/20/2016  . Prediabetes 11/02/2016  . Neuropathy 01/07/2016  . Elevated glucose 10/07/2015  . Allergic rhinitis 10/07/2015  . Insomnia 10/07/2015  . Arthritis 03/10/2015  . Gout 03/10/2015  . H/O varicella 03/10/2015  . HLD (hyperlipidemia) 03/10/2015  . Fibromyalgia 03/09/2015  . Lightheadedness 03/09/2015  . Memory deficit 03/09/2015  . Essential hypertension 03/09/2015  . Hemorrhoids, internal, with bleeding 08/12/2014    Past Medical History:  Diagnosis Date  . Arthritis   . Cancer ([place]) 2018   bladder cancer  . Complication of anesthesia    slow to get rid of anesthesia, over a couple of days.  . Diverticulosis   . Fibromyalgia 1995  . Glaucoma    pt denies  . History of bladder cancer 2018  . History of kidney stones   . Hyperlipidemia   . Hyperparathyroidism ([place]) 10/2017  . Hypertension   . Positive TB test   . Tubulovillous adenoma of colon    Transfusion: None.   Consultants (if any): Treatment Team:  Gladstone Lighter, MD  Discharged Condition: Improved  Hospital Course: Aaron Quinn is an 84 y.o. male who was admitted 11/13/2017 with a diagnosis of right hip osteoarthritis and went to the operating room on 11/13/2017 and underwent the above named procedures.    Surgeries: Procedure(s): TOTAL HIP ARTHROPLASTY on 11/13/2017 Patient tolerated the surgery well. Taken to PACU where she was stabilized and then transferred to the orthopedic floor.  Started on Lovenox 40mg  q 24 hrs.  Foot pumps applied bilaterally at 80 mm. Heels elevated on bed with rolled towels. No evidence of DVT. Negative [place]. Physical therapy started on day #1 for gait training and transfer. OT started day #1 for ADL and assisted devices.  Patient's IV was removed on POD1.  Pt had a syncopal episode on POD1, internal medicine was consulted, cardiac work-up has been negative.  Acute urinary retention with underlying UTI.  Will discharge home with foley and follow-up with urology in one week.  WIll d/c on oral antibiotics as well.  Implants: Biomet press-fit system with a #14 laterally offset Echo femoral stem, a 62 mm acetabular shell with an E-poly hi-wall liner, and a 36 mm ceramic head with a +6 mm neck.  He was given perioperative antibiotics:  Anti-infectives (From admission, onward)   Start     Dose/Rate Route Frequency Ordered Stop   11/13/17 1400  ceFAZolin (ANCEF) IVPB 2g/100 mL premix     2 g 200 mL/hr over 30 Minutes Intravenous Every 6 hours 11/13/17 1104 11/14/17 0149   11/12/17 2145  ceFAZolin (ANCEF) IVPB 2g/100 mL premix     2 g 200 mL/hr over 30 Minutes Intravenous  Once 11/12/17 2138 11/13/17 0805    .  He was given sequential compression devices, early ambulation, and lovenox for DVT prophylaxis.  He benefited maximally from the hospital stay and there were no complications.    Recent vital signs:  Vitals:   11/15/17 2110 11/15/17 2335  BP:  133/67  Pulse:  (!) 52  Resp:  16  Temp: 98  F (36.7 C) 97.8 F (36.6 C)  SpO2:  97%    Recent laboratory studies:  Lab Results  Component Value Date   HGB 10.4 (L) 11/16/2017   HGB 10.7 (L) 11/15/2017   HGB 11.0 (L) 11/14/2017   Lab Results  Component Value Date   WBC 6.7 11/16/2017   PLT 153 11/16/2017   Lab Results  Component Value Date   INR 0.97 10/31/2017   Lab Results  Component Value Date   NA 137 11/16/2017   K 3.9 11/16/2017   CL 104 11/16/2017   CO2 30 11/16/2017   BUN 12 11/16/2017   CREATININE  0.97 11/16/2017   GLUCOSE 102 (H) 11/16/2017    Discharge Medications:   Allergies as of 11/16/2017   No Known Allergies     Medication List    TAKE these medications   amLODipine 2.5 MG tablet Commonly known as:  NORVASC Take 2.5 mg daily by mouth.   citalopram 20 MG tablet Commonly known as:  CELEXA Take 20 mg by mouth daily.   donepezil 10 MG tablet Commonly known as:  ARICEPT Take 10 mg by mouth every evening.   enoxaparin 40 MG/0.4ML injection Commonly known as:  LOVENOX Inject 0.4 mLs (40 mg total) into the skin daily.   FLONASE 50 MCG/ACT nasal spray Generic drug:  fluticasone Place 1 spray into both nostrils daily as needed for allergies.   gabapentin 300 MG capsule Commonly known as:  NEURONTIN Take 300 mg by mouth 4 (four) times daily.   ketoconazole 2 % shampoo Commonly known as:  NIZORAL Apply 1 application topically once a week.   loratadine 10 MG tablet Commonly known as:  CLARITIN Take 10 mg by mouth daily.   LYRICA 25 MG capsule Generic drug:  pregabalin Take 25 mg by mouth 3 (three) times daily.   nitrofurantoin (macrocrystal-monohydrate) 100 MG capsule Commonly known as:  MACROBID Take 1 capsule (100 mg total) by mouth every 12 (twelve) hours.   ondansetron 4 MG tablet Commonly known as:  ZOFRAN Take 1 tablet (4 mg total) by mouth every 8 (eight) hours as needed for nausea.   oxyCODONE 5 MG immediate release tablet Commonly known as:  Oxy IR/ROXICODONE Take 1-2 tablets (5-10 mg total) by mouth every 4 (four) hours as needed for moderate pain (pain score 4-6).   tamsulosin 0.4 MG Caps capsule Commonly known as:  FLOMAX Take 1 capsule (0.4 mg total) by mouth daily.   traMADol 50 MG tablet Commonly known as:  ULTRAM Take 1 tablet (50 mg total) by mouth every 6 (six) hours as needed for moderate pain.   zolpidem 10 MG tablet Commonly known as:  AMBIEN Take 1 tablet (10 mg total) by mouth at bedtime as needed for sleep.             Durable Medical Equipment  (From admission, onward)         Start     Ordered   11/13/17 1105  DME Walker rolling  Once    Question:  Patient needs a walker to treat with the following condition  Answer:  Status post total hip replacement, right   11/13/17 1104   11/13/17 1105  DME Bedside commode  Once    Question:  Patient needs a bedside commode to treat with the following condition  Answer:  Status post total hip replacement, right   11/13/17 1104   11/13/17 1105  DME 3 n 1  Once     11/13/17  1104          Diagnostic Studies: Dg Chest 2 View  Result Date: 10/31/2017 CLINICAL DATA:  Preoperative examination. Patient for hip replacement 11/06/2017. EXAM: CHEST - 2 VIEW COMPARISON:  None. FINDINGS: The chest is hyperexpanded with attenuation of the pulmonary vasculature. Lungs are clear. Heart size is normal. Aortic atherosclerosis is noted. No pneumothorax or pleural effusion. No acute or focal bony abnormality. IMPRESSION: No acute disease. Emphysema. Atherosclerosis. Electronically Signed   By: Inge Rise M.D.   On: 10/31/2017 14:06   Ct Angio Chest Pe W Or Wo Contrast  Result Date: 11/14/2017 CLINICAL DATA:  84 year old male with a history of syncope EXAM: CT ANGIOGRAPHY CHEST WITH CONTRAST TECHNIQUE: Multidetector CT imaging of the chest was performed using the standard protocol during bolus administration of intravenous contrast. Multiplanar CT image reconstructions and MIPs were obtained to evaluate the vascular anatomy. CONTRAST:  59mL OMNIPAQUE IOHEXOL 350 MG/ML SOLN COMPARISON:  None. FINDINGS: Cardiovascular: Heart: No cardiomegaly. No pericardial fluid/thickening. Calcifications of the left anterior descending, circumflex, right coronary arteries. Aorta: Atherosclerotic changes of the aortic arch and descending thoracic aorta. Greatest diameter ascending aorta measures 4.2 cm. No dissection or periaortic fluid. Pulmonary arteries: No central, lobar, segmental, or  proximal subsegmental filling defects. Mediastinum/Nodes: No mediastinal adenopathy. Unremarkable appearance of the thoracic esophagus. Unremarkable appearance of the thoracic inlet and thyroid. Lungs/Pleura: Central airways are clear. No pleural effusion. No confluent airspace disease. No pneumothorax. Calcified granuloma at the apex of the left lung. Linear scarring/atelectasis at the right greater than left lung apex. Atelectasis at the dependent lung bases. Centrilobular nodularity of the lingula. Upper Abdomen: Calcifications within the liver parenchyma. Musculoskeletal: No acute displaced fracture. Degenerative changes of the spine. Review of the MIP images confirms the above findings. . IMPRESSION: CT is negative for pulmonary emboli. Ascending aorta measures 4.2 cm. Recommend annual imaging followup by CTA or MRA. This recommendation follows 2010 ACCF/AHA/AATS/ACR/ASA/SCA/SCAI/SIR/STS/SVM Guidelines for the Diagnosis and Management of Patients with Thoracic Aortic Disease. Circulation. 2010; 121: M196-Q229 Atherosclerotic changes of the thoracic aorta, with associated coronary atherosclerosis. Aortic Atherosclerosis (ICD10-I70.0). Minimal centrilobular nodularity of the lingula, potentially inflammatory/infectious. Evidence of prior granulomatous disease. Electronically Signed   By: Corrie Mckusick D.O.   On: 11/14/2017 12:35   Dg Pelvis Portable  Result Date: 11/13/2017 CLINICAL DATA:  Hip surgery this morning. Syncopal episode resulting in fall. EXAM: PORTABLE PELVIS 1-2 VIEWS COMPARISON:  Earlier same day FINDINGS: Right hip arthroplasty appears the same. No complication following the fall. The distal stem is not included on the image. No other pelvic fracture. IMPRESSION: No change. No sign of injury related to a fall. The distal stem is not included. Electronically Signed   By: Nelson Chimes M.D.   On: 11/13/2017 16:43   US Carotid Bilateral  Result Date: 11/14/2017 CLINICAL DATA:  Syncopal event.   History of hypertension. EXAM: BILATERAL CAROTID DUPLEX ULTRASOUND TECHNIQUE: Pearline Cables scale imaging, color Doppler and duplex ultrasound were performed of bilateral carotid and vertebral arteries in the neck. COMPARISON:  None. FINDINGS: Criteria: Quantification of carotid stenosis is based on velocity parameters that correlate the residual internal carotid diameter with NASCET-based stenosis levels, using the diameter of the distal internal carotid lumen as the denominator for stenosis measurement. The following velocity measurements were obtained: RIGHT ICA:  67/7 cm/sec CCA:  79/8 cm/sec SYSTOLIC ICA/CCA RATIO:  0.8 ECA:  149 cm/sec LEFT ICA:  63/13 cm/sec CCA:  92/1 cm/sec SYSTOLIC ICA/CCA RATIO:  0.8 ECA:  105 cm/sec  RIGHT CAROTID ARTERY: Mild intimal thickening present in the common carotid artery and carotid bulb. There is a mild amount of eccentric partially calcified plaque in the right internal carotid artery. Velocities and waveforms are normal and estimated right ICA stenosis is less than 50%. RIGHT VERTEBRAL ARTERY: Antegrade flow with normal waveform and velocity. LEFT CAROTID ARTERY: Mild amount of partially calcified plaque is present at the level of the carotid bulb. No evidence of ICA plaque or stenosis. The left ICA is tortuous. LEFT VERTEBRAL ARTERY: Antegrade flow with normal waveform and velocity. IMPRESSION: 1. Mild plaque in the right internal carotid artery. Estimated right ICA stenosis is less than 50%. 2. No evidence of left ICA plaque or stenosis. Mild amount of plaque in the left carotid bulb. Electronically Signed   By: Aletta Edouard M.D.   On: 11/14/2017 07:54   Dg Hip Unilat W Or W/o Pelvis 2-3 Views Right  Result Date: 11/13/2017 CLINICAL DATA:  Status post right total hip joint prosthesis placement. EXAM: DG HIP (WITH OR WITHOUT PELVIS) 2-3V RIGHT COMPARISON:  Coronal and sagittal reconstructed images through the the pelvis from an abdominal and pelvic CT scan of January 24, 2017  FINDINGS: The patient has undergone right total hip joint prosthesis placement. Radiographic positioning of the prosthetic components is good. The interface with the native bone appears normal. No acute native bone abnormality is observed. IMPRESSION: No immediate complication following right total hip joint prosthesis placement. Electronically Signed   By: David  Martinique M.D.   On: 11/13/2017 10:50    Disposition: Plan will be for discharge to SNF later today, will discharge home on oral antibiotics for UTI.   Contact information for follow-up providers    Leone Haven, MD Follow up in 1 week(s).   Specialty:  Family Medicine Contact information: [place] [address] [phone]        Abbie Sons, MD Follow up in 1 week(s).   Specialty:  Urology Why:  for acute urinary retention- now with a foley catheter Contact information: [place] [address] [phone]        Lattie Corns, PA-C Follow up in 14 day(s).   Specialty:  Physician Assistant Why:  Electa Sniff information: [place] [address] [phone]            Contact information for after-discharge care    [facility] .   Service:  Skilled Nursing Contact information: [place] [phone]                 Signed: Judson Roch PA-C 11/16/2017, 7:59 AM

## 2017-11-20 ENCOUNTER — Ambulatory Visit: Payer: Medicare Other | Admitting: Family Medicine

## 2017-11-20 ENCOUNTER — Encounter: Payer: Self-pay | Admitting: Family Medicine

## 2017-11-20 VITALS — BP 130/70 | HR 73 | Temp 98.8°F | Resp 16 | Wt 168.4 lb

## 2017-11-20 DIAGNOSIS — Z96641 Presence of right artificial hip joint: Secondary | ICD-10-CM | POA: Diagnosis not present

## 2017-11-20 DIAGNOSIS — R55 Syncope and collapse: Secondary | ICD-10-CM | POA: Diagnosis not present

## 2017-11-20 DIAGNOSIS — R931 Abnormal findings on diagnostic imaging of heart and coronary circulation: Secondary | ICD-10-CM | POA: Diagnosis not present

## 2017-11-20 DIAGNOSIS — R339 Retention of urine, unspecified: Secondary | ICD-10-CM | POA: Diagnosis not present

## 2017-11-20 LAB — CULTURE, BLOOD (SINGLE)
CULTURE: NO GROWTH
SPECIAL REQUESTS: ADEQUATE

## 2017-11-20 NOTE — Assessment & Plan Note (Signed)
History seems to be orthostatic in nature.  He had an extensive work-up with no obvious cause.  I will have the CMA contact the patient regarding the findings of elevated pulmonary artery pressures and his aortic enlargement with recommendation for annual CT angiogram.  No further work-up necessary at this time.

## 2017-11-20 NOTE — Assessment & Plan Note (Signed)
Foley catheter in place.  He will see urology as planned.

## 2017-11-20 NOTE — Progress Notes (Signed)
[name], MD Phone: [phone]  Aaron Quinn is a 84 y.o. male who presents today for f/u.  CC: Right hip replacement, urinary retention, syncope  Patient was hospitalized from 11/13/2017-11/16/2017 for right hip replacement.  This was complicated by syncopal episode that seems to have been orthostatic in nature postop day 0.  He got up out of bed and then turned and passed out.  He was snoring during this.  Apparently there was some difficulty finding his pulse though they determined that they were just not able to feel it as it was faint.  He had extensive work-up for cause of syncope which was negative for cause.  His CT angiogram did reveal ascending aortic enlargement with recommendation for annual CT angiogram or MRA.  Echo revealed a mildly dilated aortic root.  Pulmonary artery pressure was moderately elevated.  Carotid ultrasound with mild plaque of the right internal carotid artery with stenosis of less than 50%.  Left internal carotid artery with no plaque or stenosis.  The patient has had no recurrent syncope.  He had no head injury with the event.  He had possible lightheadedness preceding it though no other symptoms.  He has felt somewhat groggy though that has been improving.  He has been taking tramadol for pain.  He has had no fevers.  He also had urinary retention for which a Foley was placed which is still in place.  He was discharged on Macrobid.  He sees urology in follow-up later this week.  Social History   Tobacco Use  Smoking Status Former Smoker  . Packs/day: 0.50  . Years: 20.00  . Pack years: 10.00  . Types: Cigarettes  . Last attempt to quit: 03/20/1982  . Years since quitting: 35.6  Smokeless Tobacco Never Used     ROS see history of present illness  Objective  Physical Exam Vitals:   11/20/17 1542  BP: 130/70  Pulse: 73  Resp: 16  Temp: 98.8 F (37.1 C)  SpO2: 97%    BP Readings from Last 3 Encounters:  11/20/17 130/70  11/16/17 138/73   10/31/17 (!) 160/80   Wt Readings from Last 3 Encounters:  11/20/17 168 lb 6 oz (76.4 kg)  11/13/17 169 lb 12.8 oz (77 kg)  10/31/17 169 lb 12.8 oz (77 kg)    Physical Exam  Constitutional: He is oriented to person, place, and time. No distress.  Cardiovascular: Normal rate, regular rhythm and normal heart sounds.  Pulmonary/Chest: Effort normal and breath sounds normal.  Musculoskeletal: He exhibits no edema.       Legs: Neurological: He is alert and oriented to person, place, and time.  CN 2-12 intact, 5/5 strength in bilateral biceps, triceps, grip, quads, hamstrings, plantar and dorsiflexion, sensation to light touch intact in bilateral UE and LE  Skin: Skin is warm and dry. He is not diaphoretic.     Assessment/Plan: Please see individual problem list.  Status post total hip replacement, right Overall seems to be doing relatively well.  Some grogginess which may be related to a combination of his Ambien and his pain medication tramadol.  Discussed he should not take the tramadol when he takes Ambien.  He notes improving energy levels.  He will keep his appointment with orthopedics next week.  They will contact the orthopedic office regarding home health PT as they report this was supposed to have been set up.  Urinary retention Foley catheter in place.  He will see urology as planned.  Syncope History  seems to be orthostatic in nature.  He had an extensive work-up with no obvious cause.  I will have the CMA contact the patient regarding the findings of elevated pulmonary artery pressures and his aortic enlargement with recommendation for annual CT angiogram.  No further work-up necessary at this time.  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  They did not know about this finding and they were unsure why they are being contacted by oncology.  I relayed the finding based on review of care everywhere notes from Dr.  Sherren Mocha office.  The patient and his wife will follow up on getting this scheduled.  No orders of the defined types were placed in this encounter.   No orders of the defined types were placed in this encounter.    [name], MD [place]

## 2017-11-20 NOTE — Assessment & Plan Note (Signed)
Overall seems to be doing relatively well.  Some grogginess which may be related to a combination of his Ambien and his pain medication tramadol.  Discussed he should not take the tramadol when he takes Ambien.  He notes improving energy levels.  He will keep his appointment with orthopedics next week.  They will contact the orthopedic office regarding home health PT as they report this was supposed to have been set up.

## 2017-11-20 NOTE — Patient Instructions (Signed)
Nice to see you. Please follow-up with orthopedics and urology as planned. I am glad your energy and grogginess are improving.  If they do not continue to improve please be reevaluated.  Please be careful with taking your pain medication.

## 2017-11-22 NOTE — Addendum Note (Signed)
Addended by: Leone Haven on: 11/22/2017 10:46 AM   Modules accepted: Orders

## 2017-11-22 NOTE — Progress Notes (Signed)
Cardiology referral placed

## 2017-11-23 ENCOUNTER — Encounter: Payer: Self-pay | Admitting: Urology

## 2017-11-23 ENCOUNTER — Ambulatory Visit (INDEPENDENT_AMBULATORY_CARE_PROVIDER_SITE_OTHER): Payer: Medicare Other | Admitting: Urology

## 2017-11-23 VITALS — BP 104/57 | HR 85 | Ht 70.0 in | Wt 165.1 lb

## 2017-11-23 DIAGNOSIS — R339 Retention of urine, unspecified: Secondary | ICD-10-CM

## 2017-11-23 NOTE — Progress Notes (Signed)
11/23/2017 9:16 AM   Aaron Quinn Jan 20, 1932 [id number]  Referring provider: Leone Haven, MD [address] [place], [place] [address]  Chief Complaint  Patient presents with  . Hospitalization Follow-up  . Routine Post Op    HPI: 84 year old male followed for urothelial carcinoma.  He underwent a right total hip arthroplasty on 11/13/2017.  He had postoperative urinary retention necessitating replacement of his Foley catheter.  He was discharged with an indwelling Foley and started on tamsulosin.  Prior surveillance cystoscopy remarkable for moderate lateral lobe enlargement.  He is on antibiotic therapy.   PMH: Past Medical History:  Diagnosis Date  . Arthritis   . Cancer ([facility]) 2018   bladder cancer  . Complication of anesthesia    slow to get rid of anesthesia, over a couple of days.  . Diverticulosis   . Fibromyalgia 1995  . Glaucoma    pt denies  . History of bladder cancer 2018  . History of kidney stones   . Hyperlipidemia   . Hyperparathyroidism ([facility]) 10/2017  . Hypertension   . Positive TB test   . Tubulovillous adenoma of colon     Surgical History: Past Surgical History:  Procedure Laterality Date  . APPENDECTOMY  2005  . CATARACT EXTRACTION, BILATERAL  2012  . COLONOSCOPY W/ BIOPSIES    . CYST EXCISION Right 1950's   thigh  . HEMORRHOID BANDING    . KNEE ARTHROSCOPY Left 2014  . KNEE CARTILAGE SURGERY Left 1951  . NASAL SINUS SURGERY  1950's  . TONSILLECTOMY  1940  . TOTAL HIP ARTHROPLASTY Right 11/13/2017   Procedure: TOTAL HIP ARTHROPLASTY;  Surgeon: Corky Mull, MD;  Location: [facility] ORS;  Service: Orthopedics;  Laterality: Right;  . TRANSURETHRAL RESECTION OF BLADDER TUMOR N/A 02/13/2017   Procedure: TRANSURETHRAL RESECTION OF BLADDER TUMOR (TURBT);  Surgeon: Abbie Sons, MD;  Location: [facility] ORS;  Service: Urology;  Laterality: N/A;  45 minutes needed  . VASECTOMY  1972    Home Medications:  Allergies as of 11/23/2017    No Known Allergies     Medication List        Accurate as of 11/23/17  9:16 AM. Always use your most recent med list.          amLODipine 2.5 MG tablet Commonly known as:  NORVASC Take 2.5 mg daily by mouth.   citalopram 20 MG tablet Commonly known as:  CELEXA Take 20 mg by mouth daily.   donepezil 10 MG tablet Commonly known as:  ARICEPT Take 10 mg by mouth every evening.   enoxaparin 40 MG/0.4ML injection Commonly known as:  LOVENOX Inject 0.4 mLs (40 mg total) into the skin daily.   FLONASE 50 MCG/ACT nasal spray Generic drug:  fluticasone Place 1 spray into both nostrils daily as needed for allergies.   gabapentin 300 MG capsule Commonly known as:  NEURONTIN Take 300 mg by mouth 4 (four) times daily.   ketoconazole 2 % shampoo Commonly known as:  NIZORAL Apply 1 application topically once a week.   loratadine 10 MG tablet Commonly known as:  CLARITIN Take 10 mg by mouth daily.   LYRICA 25 MG capsule Generic drug:  pregabalin Take 25 mg by mouth 3 (three) times daily.   nitrofurantoin (macrocrystal-monohydrate) 100 MG capsule Commonly known as:  MACROBID Take 1 capsule (100 mg total) by mouth every 12 (twelve) hours.   ondansetron 4 MG tablet Commonly known as:  ZOFRAN Take 1 tablet (4  mg total) by mouth every 8 (eight) hours as needed for nausea.   oxyCODONE 5 MG immediate release tablet Commonly known as:  Oxy IR/ROXICODONE Take 1-2 tablets (5-10 mg total) by mouth every 4 (four) hours as needed for moderate pain (pain score 4-6).   rOPINIRole 2 MG 24 hr tablet Commonly known as:  REQUIP XL ropinirole ER 2 mg tablet,extended release 24 hr  TAKE 1 TABLET DAILY FOR 1 WEEK, THEN INCREASE TO 1 TABLET TWICE DAILY AND CONTINUE THIS DOSE   tamsulosin 0.4 MG Caps capsule Commonly known as:  FLOMAX Take 1 capsule (0.4 mg total) by mouth daily.   traMADol 50 MG tablet Commonly known as:  ULTRAM Take 1 tablet (50 mg total) by mouth every 6 (six) hours  as needed for moderate pain.   triamcinolone cream 0.1 % Commonly known as:  KENALOG triamcinolone acetonide 0.1 % topical cream  APPLY TO AFFECTED AREA TWICE A DAY UNTIL CLEAR AVOIDING FACE, GROIN, AND UNDERARMS   zolpidem 10 MG tablet Commonly known as:  AMBIEN Take 1 tablet (10 mg total) by mouth at bedtime as needed for sleep.       Allergies: No Known Allergies  Family History: Family History  Problem Relation Age of Onset  . Colon cancer Maternal Grandmother 51       Died at 61  . Heart disease Father   . Arthritis Unknown        Parent  . Hypertension Unknown        Parent, grandparent  . Diabetes Son   . Alcohol abuse Son   . Depression Son   . Liver cancer Mother   . Rheum arthritis Mother   . Colon polyps Neg Hx   . Kidney disease Neg Hx   . Esophageal cancer Neg Hx   . Gallbladder disease Neg Hx     Social History:  reports that he quit smoking about 35 years ago. His smoking use included cigarettes. He has a 10.00 pack-year smoking history. He has never used smokeless tobacco. He reports that he does not drink alcohol or use drugs.  ROS: UROLOGY Frequent Urination?: No Hard to postpone urination?: No Burning/pain with urination?: No Get up at night to urinate?: No Leakage of urine?: No Urine stream starts and stops?: No Trouble starting stream?: No Do you have to strain to urinate?: No Blood in urine?: No Urinary tract infection?: No Sexually transmitted disease?: No Injury to kidneys or bladder?: No Painful intercourse?: No Weak stream?: No Erection problems?: No Penile pain?: No  Gastrointestinal Nausea?: No Vomiting?: No Indigestion/heartburn?: No Diarrhea?: No Constipation?: No  Constitutional Fever: No Night sweats?: No Weight loss?: No Fatigue?: No  Skin Skin rash/lesions?: No Itching?: No  Eyes Blurred vision?: No Double vision?: No  Ears/Nose/Throat Sore throat?: No Sinus problems?:  No  Hematologic/Lymphatic Swollen glands?: No Easy bruising?: No  Cardiovascular Leg swelling?: No Chest pain?: No  Respiratory Cough?: No Shortness of breath?: No  Endocrine Excessive thirst?: No  Musculoskeletal Back pain?: No Joint pain?: No  Neurological Headaches?: No Dizziness?: No  Psychologic Depression?: No Anxiety?: No  Physical Exam: BP (!) 104/57 (BP Location: Left Arm, Patient Position: Sitting, Cuff Size: Normal)   Pulse 85   Ht 5\' 10"  (1.778 m)   Wt 165 lb 1.6 oz (74.9 kg)   BMI 23.69 kg/m   Constitutional:  Alert and oriented, No acute distress. HEENT: [place] AT, moist mucus membranes.  Trachea midline, no masses. Cardiovascular: No clubbing, cyanosis, or  edema. Respiratory: Normal respiratory effort, no increased work of breathing. GI: Abdomen is soft, nontender, nondistended, no abdominal masses GU: Foley catheter draining clear urine   Assessment & Plan:   Postoperative urinary retention status post right hip arthroplasty.  His catheter was removed and he was instructed to call for recurrent urinary retention.  He will otherwise follow-up in approximately 2 weeks for nurse visit with bladder scan for PVR.  Return in about 2 weeks (around 12/07/2017) for Nurse visit,, Bladder scan.  Abbie Sons, [place] [address] [place], [place] [address] [phone]

## 2017-11-25 ENCOUNTER — Encounter: Payer: Self-pay | Admitting: Urology

## 2017-11-27 ENCOUNTER — Other Ambulatory Visit: Payer: Self-pay | Admitting: Family Medicine

## 2017-11-27 DIAGNOSIS — F5101 Primary insomnia: Secondary | ICD-10-CM

## 2017-11-27 NOTE — Telephone Encounter (Signed)
Copied from [place] [id number]. Topic: Quick Communication - Rx Refill/Question >> Nov 27, 2017 10:02 AM Yvette Rack wrote: Medication: zolpidem (AMBIEN) 10 MG tablet  Has the patient contacted their pharmacy? yes   Preferred Pharmacy (with phone number or street name): [facility], [place] [phone] (Phone) [phone] (Fax)  Agent: Please be advised that RX refills may take up to 3 business days. We ask that you follow-up with your pharmacy.

## 2017-11-27 NOTE — Telephone Encounter (Signed)
Refill of Ambien  LRF 11/05/17  #90  0 refills  LOV 11/20/17 Dr. Caryl Bis   [facility] - [place], [place] - [address]        [phone] (Phone) [phone] (Fax)

## 2017-11-29 NOTE — Telephone Encounter (Signed)
rx request 

## 2017-11-29 NOTE — Telephone Encounter (Signed)
I am confused -- an 90 day supply was sent in by Dr Caryl Bis in August  He should not need a refill

## 2017-12-06 NOTE — Telephone Encounter (Signed)
Please call to see what is going on -- this med was refilled 90 day supply at end of august, not sure why request for refill is happening

## 2017-12-07 ENCOUNTER — Ambulatory Visit (INDEPENDENT_AMBULATORY_CARE_PROVIDER_SITE_OTHER): Payer: Medicare Other

## 2017-12-07 VITALS — BP 134/73 | HR 77 | Ht 68.0 in | Wt 162.0 lb

## 2017-12-07 DIAGNOSIS — R339 Retention of urine, unspecified: Secondary | ICD-10-CM

## 2017-12-07 LAB — BLADDER SCAN AMB NON-IMAGING: SCAN RESULT: 75

## 2017-12-07 NOTE — Progress Notes (Signed)
Pt present today for a follow-up nurse visit with pvr. Pt was able to void and bladder scan noted 52ml of urine present. Pt states no problems. He was instructed to follow-up as scheduled.

## 2018-01-07 ENCOUNTER — Telehealth: Payer: Self-pay | Admitting: Urology

## 2018-01-07 NOTE — Telephone Encounter (Signed)
App made Left message for pt to cb and confirm Mailed MB

## 2018-01-07 NOTE — Telephone Encounter (Signed)
-----   Message from Abbie Sons, MD sent at 01/04/2018  8:23 AM EDT ----- Patient due for surveillance cystoscopy November 2019

## 2018-01-22 ENCOUNTER — Encounter: Payer: Self-pay | Admitting: Urology

## 2018-01-22 ENCOUNTER — Ambulatory Visit (INDEPENDENT_AMBULATORY_CARE_PROVIDER_SITE_OTHER): Payer: Medicare Other | Admitting: Urology

## 2018-01-22 VITALS — BP 164/78 | HR 61 | Ht 70.0 in | Wt 167.2 lb

## 2018-01-22 DIAGNOSIS — C679 Malignant neoplasm of bladder, unspecified: Secondary | ICD-10-CM

## 2018-01-22 LAB — URINALYSIS, COMPLETE
BILIRUBIN UA: NEGATIVE
GLUCOSE, UA: NEGATIVE
KETONES UA: NEGATIVE
Leukocytes, UA: NEGATIVE
NITRITE UA: NEGATIVE
SPEC GRAV UA: 1.025 (ref 1.005–1.030)
UUROB: 0.2 mg/dL (ref 0.2–1.0)
pH, UA: 5.5 (ref 5.0–7.5)

## 2018-01-22 LAB — MICROSCOPIC EXAMINATION: Epithelial Cells (non renal): NONE SEEN /hpf (ref 0–10)

## 2018-01-22 MED ORDER — LIDOCAINE HCL URETHRAL/MUCOSAL 2 % EX GEL
1.0000 "application " | Freq: Once | CUTANEOUS | Status: AC
  Administered 2018-01-22: 1 via URETHRAL

## 2018-01-22 NOTE — Progress Notes (Signed)
   01/22/18  CC:  Chief Complaint  Patient presents with  . Cysto   Urologic history: -HG Ta urothelial carcinoma November 2018 -Induction BCG completed February 2019  HPI:  Blood pressure (!) 164/78, pulse 61, height 5\' 10"  (1.778 m), weight 167 lb 3.2 oz (75.8 kg). NED. A&Ox3.   No respiratory distress   Abd soft, NT, ND Normal phallus with bilateral descended testicles  Cystoscopy Procedure Note  Patient identification was confirmed, informed consent was obtained, and patient was prepped using Betadine solution.  Lidocaine jelly was administered per urethral meatus.     Pre-Procedure: - Inspection reveals a normal caliber urethral meatus.  Procedure: The flexible cystoscope was introduced without difficulty - No urethral strictures/lesions are present. - Moderate lateral lobe enlargement prostate  - Mild elevation bladder neck - Bilateral ureteral orifices identified - Bladder mucosa  reveals no ulcers, tumors.  Scattered posterior wall erythema - No bladder stones   Retroflexion shows no intravesical median lobe   Post-Procedure: - Patient tolerated the procedure well  Assessment/ Plan: No recurrent papillary tumor.  Scattered posterior wall erythema.  Urine cytology was ordered and if negative follow-up 3 months for surveillance cystoscopy   Abbie Sons, MD

## 2018-01-23 ENCOUNTER — Encounter: Payer: Self-pay | Admitting: Family Medicine

## 2018-01-23 ENCOUNTER — Ambulatory Visit (INDEPENDENT_AMBULATORY_CARE_PROVIDER_SITE_OTHER): Payer: Medicare Other | Admitting: Family Medicine

## 2018-01-23 VITALS — BP 162/78 | HR 63 | Temp 97.7°F | Ht 69.5 in | Wt 168.2 lb

## 2018-01-23 DIAGNOSIS — I10 Essential (primary) hypertension: Secondary | ICD-10-CM

## 2018-01-23 MED ORDER — AMLODIPINE BESYLATE 5 MG PO TABS: 5.0000 mg | ORAL_TABLET | Freq: Every day | ORAL | 0 refills | Status: DC

## 2018-01-23 NOTE — Progress Notes (Signed)
Subjective:    Patient ID: Aaron Quinn, male    DOB: 09-02-1931, 84 y.o.   MRN: [id number]  HPI   Patient presents to clinic due to elevated blood pressure readings over the past 3 days.  Patient states he usually monitors blood pressure at home with his cough, and over the past couple of days he has been getting high readings in the 160s to 180s over 90s.  Patient also went to urology yesterday for procedure and blood pressure was in the 160s over high 70s.  Patient denies any chest pain, shortness of breath, palpitations, feeling faint or dizzy, or leg swelling.  Patient currently takes amlodipine 2.5 mg once daily and this dose has been working well to control blood pressure up until last 3 days.   BP Readings from Last 3 Encounters:  01/23/18 (!) 190/90  01/22/18 (!) 164/78  12/07/17 134/73   Recent BMPs reviewed, stable BMP Latest Ref Rng & Units 11/16/2017 11/15/2017 11/14/2017  Glucose 70 - 99 mg/dL 102(H) 121(H) 137(H)  BUN 8 - 23 mg/dL 12 10 12   Creatinine 0.61 - 1.24 mg/dL 0.97 1.04 1.01  BUN/Creat Ratio 10 - 24 - - -  Sodium 135 - 145 mmol/L 137 135 131(L)  Potassium 3.5 - 5.1 mmol/L 3.9 3.9 4.2  Chloride 98 - 111 mmol/L 104 102 101  CO2 22 - 32 mmol/L 30 27 26   Calcium 8.9 - 10.3 mg/dL 9.6 9.4 8.8(L)   Review of Systems  Constitutional: Negative for chills, fatigue and fever.  HENT: Negative for congestion, ear pain, sinus pain and sore throat.   Eyes: Negative.   Respiratory: Negative for cough, shortness of breath and wheezing.   Cardiovascular: Negative for chest pain, palpitations and leg swelling.  Gastrointestinal: Negative for abdominal pain, diarrhea, nausea and vomiting.  Genitourinary: Negative for dysuria, frequency and urgency.  Musculoskeletal: Negative for arthralgias and myalgias.  Skin: Negative for color change, pallor and rash.  Neurological: Negative for syncope, light-headedness and headaches.  Psychiatric/Behavioral: The patient is not  nervous/anxious.       Objective:   Physical Exam  Physical Exam  Constitutional: He is oriented to person, place, and time. He appears well-developed and well-nourished. No distress.  Head: Normocephalic and atraumatic.  Eyes: Conjunctivae and EOM are normal. No scleral icterus.  Neck: Normal range of motion. Neck supple. No tracheal deviation present.  Cardiovascular: Normal rate, regular rhythm and normal heart sounds. No LE edema Pulmonary/Chest: Effort normal and breath sounds normal. No respiratory distress. He has no wheezes. He has no rales.  Abdominal: Soft. Bowel sounds are normal. There is no tenderness.  Neurological: He is alert and oriented to person, place, and time.  Gait normal  Skin: Skin is warm and dry. He is not diaphoretic. No pallor.  Psychiatric: He has a normal mood and affect. His behavior is normal. Thought content normal.   Nursing note and vitals reviewed.   Vitals:   01/23/18 0918 01/23/18 0932  BP: (!) 190/90 (!) 162/78  Pulse: 63   Temp: 97.7 F (36.5 C)   SpO2: 96%       Assessment & Plan:   Essential hypertension- we will trial increasing amlodipine from 2.5 mg to 5 mg once daily.  Patient will monitor his blood pressure at home using his home cuff and keep a log of readings.  He will return to clinic in 2 weeks for a nurse visit and also bring his blood pressure cuff from home so we  can compare readings from his cuff to our cuff.  Patient advised to monitor self for any development of chest pain, shortness of breath, palpitations, leg swelling.  If any of these things occur he is advised to call office right away.  Schedule nurse visit for 2 weeks to recheck blood pressure.

## 2018-01-23 NOTE — Patient Instructions (Addendum)
Increase amlodipine to 5 mg per day and we will have nurse recheck BP in 2 weeks.  Please bring your home BP cuff to nurse visit so we can compare readings.

## 2018-01-28 ENCOUNTER — Other Ambulatory Visit: Payer: Self-pay | Admitting: Urology

## 2018-01-28 DIAGNOSIS — N028 Recurrent and persistent hematuria with other morphologic changes: Secondary | ICD-10-CM

## 2018-01-29 ENCOUNTER — Telehealth: Payer: Self-pay | Admitting: Urology

## 2018-01-29 NOTE — Telephone Encounter (Signed)
Pt calling stating he was seen and left a urine on 11/05 has been expecting a call about results and follow up plan, hasn't heard from anyone about his results, Please advise pt of results @ [phone]

## 2018-01-29 NOTE — Telephone Encounter (Signed)
Spoke to patient and informed him that [facility] will be looking at the Cytology results and send [place] a message, as soon as we get the result we will contact him.

## 2018-02-01 ENCOUNTER — Telehealth: Payer: Self-pay | Admitting: Family Medicine

## 2018-02-01 NOTE — Telephone Encounter (Signed)
LMOM for patient to return call.

## 2018-02-01 NOTE — Telephone Encounter (Signed)
Stoioff, Ronda Fairly, MD 1 hour ago (7:09 AM)      Urine cytology showed no evidence of malignant cells however there were red blood cells indicative of bleeding from the filtering aspect of the kidneys.  Would recommend a baseline nephrology evaluation.  Referral was entered to [facility]

## 2018-02-01 NOTE — Progress Notes (Signed)
Urine cytology showed no evidence of malignant cells however there were red blood cells indicative of bleeding from the filtering aspect of the kidneys.  Would recommend a baseline nephrology evaluation.  Referral was entered to [facility]

## 2018-02-01 NOTE — Telephone Encounter (Signed)
Left detailed message for pt 

## 2018-02-04 ENCOUNTER — Telehealth: Payer: Self-pay | Admitting: Urology

## 2018-02-04 NOTE — Telephone Encounter (Signed)
Pt called office and I read message from [facility]

## 2018-02-05 ENCOUNTER — Ambulatory Visit (INDEPENDENT_AMBULATORY_CARE_PROVIDER_SITE_OTHER): Payer: Medicare Other | Admitting: *Deleted

## 2018-02-05 VITALS — BP 142/68 | HR 66

## 2018-02-05 DIAGNOSIS — I10 Essential (primary) hypertension: Secondary | ICD-10-CM

## 2018-02-05 NOTE — Progress Notes (Signed)
Please advise patient to stay on the 5mg  dose of amlodipine as this seems to be working for BP control.  We will continue to keep close eye on BP at his upcoming appt

## 2018-02-05 NOTE — Progress Notes (Signed)
Pt came in for BP check. BP in left arm was BP:142/68 P: 66 O2:95%.

## 2018-02-06 ENCOUNTER — Ambulatory Visit: Payer: Medicare Other

## 2018-02-12 ENCOUNTER — Other Ambulatory Visit: Payer: Self-pay | Admitting: Family Medicine

## 2018-02-12 DIAGNOSIS — I10 Essential (primary) hypertension: Secondary | ICD-10-CM

## 2018-02-25 ENCOUNTER — Other Ambulatory Visit: Payer: Self-pay | Admitting: Family Medicine

## 2018-02-25 DIAGNOSIS — F5101 Primary insomnia: Secondary | ICD-10-CM

## 2018-02-26 NOTE — Telephone Encounter (Signed)
Last OV 01/23/2018 with Philis Nettle   Last refilled 11/15/2017 disp 90 with no refill   Sent to PCP for approval

## 2018-02-28 ENCOUNTER — Other Ambulatory Visit: Payer: Self-pay | Admitting: Lab

## 2018-02-28 DIAGNOSIS — I10 Essential (primary) hypertension: Secondary | ICD-10-CM

## 2018-02-28 MED ORDER — AMLODIPINE BESYLATE 5 MG PO TABS: 5.0000 mg | ORAL_TABLET | Freq: Every day | ORAL | 0 refills | Status: DC

## 2018-02-28 NOTE — Telephone Encounter (Signed)
Controlled substance database reviewed. Sent to pharmacy.   

## 2018-03-06 ENCOUNTER — Ambulatory Visit (INDEPENDENT_AMBULATORY_CARE_PROVIDER_SITE_OTHER): Payer: Medicare Other | Admitting: Family Medicine

## 2018-03-06 ENCOUNTER — Encounter: Payer: Self-pay | Admitting: Family Medicine

## 2018-03-06 ENCOUNTER — Encounter

## 2018-03-06 VITALS — BP 140/80 | HR 68 | Temp 98.0°F | Ht 69.5 in | Wt 167.0 lb

## 2018-03-06 DIAGNOSIS — M791 Myalgia, unspecified site: Secondary | ICD-10-CM | POA: Diagnosis not present

## 2018-03-06 DIAGNOSIS — F5101 Primary insomnia: Secondary | ICD-10-CM

## 2018-03-06 DIAGNOSIS — R202 Paresthesia of skin: Secondary | ICD-10-CM

## 2018-03-06 DIAGNOSIS — R7303 Prediabetes: Secondary | ICD-10-CM | POA: Diagnosis not present

## 2018-03-06 DIAGNOSIS — Z96641 Presence of right artificial hip joint: Secondary | ICD-10-CM

## 2018-03-06 DIAGNOSIS — M797 Fibromyalgia: Secondary | ICD-10-CM

## 2018-03-06 DIAGNOSIS — G629 Polyneuropathy, unspecified: Secondary | ICD-10-CM

## 2018-03-06 DIAGNOSIS — R350 Frequency of micturition: Secondary | ICD-10-CM | POA: Diagnosis not present

## 2018-03-06 DIAGNOSIS — R251 Tremor, unspecified: Secondary | ICD-10-CM

## 2018-03-06 DIAGNOSIS — E213 Hyperparathyroidism, unspecified: Secondary | ICD-10-CM

## 2018-03-06 LAB — POCT URINALYSIS DIPSTICK
Bilirubin, UA: NEGATIVE
GLUCOSE UA: NEGATIVE
KETONES UA: NEGATIVE
Nitrite, UA: NEGATIVE
Protein, UA: POSITIVE — AB
SPEC GRAV UA: 1.015 (ref 1.010–1.025)
Urobilinogen, UA: 0.2 E.U./dL
pH, UA: 6.5 (ref 5.0–8.0)

## 2018-03-06 LAB — VITAMIN B12: VITAMIN B 12: 269 pg/mL (ref 211–911)

## 2018-03-06 LAB — URINALYSIS, MICROSCOPIC ONLY

## 2018-03-06 LAB — COMPREHENSIVE METABOLIC PANEL
ALK PHOS: 104 U/L (ref 39–117)
ALT: 9 U/L (ref 0–53)
AST: 12 U/L (ref 0–37)
Albumin: 4.4 g/dL (ref 3.5–5.2)
BUN: 19 mg/dL (ref 6–23)
CO2: 28 mEq/L (ref 19–32)
Calcium: 9.3 mg/dL (ref 8.4–10.5)
Chloride: 103 mEq/L (ref 96–112)
Creatinine, Ser: 1.23 mg/dL (ref 0.40–1.50)
GFR: 59.53 mL/min — AB (ref >60.00)
GLUCOSE: 175 mg/dL — AB (ref 70–99)
POTASSIUM: 4 meq/L (ref 3.5–5.1)
Sodium: 140 mEq/L (ref 135–145)
TOTAL PROTEIN: 7.4 g/dL (ref 6.0–8.3)
Total Bilirubin: 0.5 mg/dL (ref 0.2–1.2)

## 2018-03-06 LAB — POCT GLYCOSYLATED HEMOGLOBIN (HGB A1C): HEMOGLOBIN A1C: 5.6 % (ref 4.0–5.6)

## 2018-03-06 LAB — TSH: TSH: 2.24 u[IU]/mL (ref 0.35–4.50)

## 2018-03-06 LAB — CK: Total CK: 54 U/L (ref 7–232)

## 2018-03-06 NOTE — Patient Instructions (Signed)
Nice to see you. We will check lab work today and contact you with results. Please keep your appointment with your neurologist and discuss your Lyrica, gabapentin, and Celexa with them as you may not need these for your fibromyalgia. We will check your urine and contact you with the results. Please discuss the tingling and tremor with your neurologist as well.

## 2018-03-06 NOTE — Progress Notes (Signed)
[name], MD Phone: [phone]  Aaron Quinn is a 84 y.o. male who presents today for f/u.  CC: fibromyalgia, hyperparathyroidism, muscle aches, tingling of fingers and feet, tremor, change in bowels, urinary frequency  Fibromyalgia: Patient notes his pain symptoms have improved since having had a parathyroid adenoma removed.  He is on Lyrica, gabapentin, and Celexa and wonders if he should be on all of these.  Hyperparathyroidism: He is status post parathyroidectomy for a parathyroid adenoma.  He notes this has helped with his chronic pain.  He has had no issues following the surgery.  Myalgias: Patient reports he is done quite well since he had his right hip replaced.  He has gotten rid of the walker and cane after 3 weeks.  He notes over the last month his progress has slowed.  He did see orthopedics and they released him.  He did discuss his shoulder pain and knee pain with them that was contributing to muscle aches in his legs and arms.  He was advised that this was related to him using these areas more after his hip replacement though they have not improved.  Patient reports he has had some tingling in his fingers bilaterally.  He has chronic tingling in his feet.  He notes some tremor as well at rest when he holds his hands up.  He notes these are relatively new issues.  Patient does report change in his bowels and urinary frequency since undergoing his hip replacement.  He notes he had a spinal block and they advised him that he may have a change in these things though he notes that the symptoms have persisted.  He notes occasionally he will feel like he has to urinate though not much will come out.  He does note he empties his bladder typically.  No blood in his urine.  He is going to urinate 6-8 times a day and 3 times a night.  No straining.  Occasional urinary urgency.  He notes his bowel movements have increased in frequency and have become more soft.  He notes no blood in  his stool.  Insomnia: Notes he is sleeping well with Ambien.  Social History   Tobacco Use  Smoking Status Former Smoker  . Packs/day: 0.50  . Years: 20.00  . Pack years: 10.00  . Types: Cigarettes  . Last attempt to quit: 03/20/1982  . Years since quitting: 35.9  Smokeless Tobacco Never Used     ROS see history of present illness  Objective  Physical Exam Vitals:   03/06/18 0956  BP: 140/80  Pulse: 68  Temp: 98 F (36.7 C)  SpO2: 94%    BP Readings from Last 3 Encounters:  03/06/18 140/80  02/05/18 (!) 142/68  01/23/18 (!) 162/78   Wt Readings from Last 3 Encounters:  03/06/18 167 lb (75.8 kg)  01/23/18 168 lb 3.2 oz (76.3 kg)  01/22/18 167 lb 3.2 oz (75.8 kg)    Physical Exam Constitutional:      General: He is not in acute distress.    Appearance: He is not diaphoretic.  Cardiovascular:     Rate and Rhythm: Normal rate and regular rhythm.     Heart sounds: Normal heart sounds.  Pulmonary:     Effort: Pulmonary effort is normal.     Breath sounds: Normal breath sounds.  Abdominal:     General: Bowel sounds are normal. There is no distension.     Palpations: Abdomen is soft.     Tenderness:  There is no abdominal tenderness.  Skin:    General: Skin is warm and dry.  Neurological:     Mental Status: He is alert.     Comments: CN 2-12 intact, 5/5 strength in bilateral biceps, triceps, grip, quads, hamstrings, plantar and dorsiflexion, sensation to light touch intact in bilateral UE and LE, normal gait, mild tremor noted with him holding his hands out      Assessment/Plan: Please see individual problem list.  Hyperparathyroidism ([place]) Some of his fibromyalgia symptoms may be related to this.  He is status post surgery for hyperparathyroidism.  Symptoms have improved.  Neuropathy (HCC) Numbness and tingling in his fingers and feet could be related to prediabetes versus a B12 deficiency.  We will check an A1c and a B12.  He will discuss this with his  neurologist as well when he follows up.  Tremor He will discuss with his neurologist at his upcoming visit.  Status post total hip replacement, right Overall patient is doing quite well.  I do suspect some of his achiness is related to his postsurgical course though we will check lab work to evaluate myalgias.  Myalgia Check labs as outlined below.  Urinary frequency Check urinalysis.  Fibromyalgia He will discuss his medications with neurology.  He may not need all 3 medications for his fibromyalgia now that he has had his parathyroidectomy.  Insomnia Continue Ambien.   Orders Placed This Encounter  Procedures  . Urine Culture  . Comp Met (CMET)  . CK (Creatine Kinase)  . TSH  . B12  . Urine Microscopic Only  . POCT Urinalysis Dipstick  . POCT HgB A1C    No orders of the defined types were placed in this encounter.    [name], MD [place]

## 2018-03-07 ENCOUNTER — Other Ambulatory Visit: Payer: Self-pay | Admitting: Family Medicine

## 2018-03-07 DIAGNOSIS — R251 Tremor, unspecified: Secondary | ICD-10-CM | POA: Insufficient documentation

## 2018-03-07 DIAGNOSIS — E538 Deficiency of other specified B group vitamins: Secondary | ICD-10-CM

## 2018-03-07 DIAGNOSIS — R202 Paresthesia of skin: Secondary | ICD-10-CM | POA: Insufficient documentation

## 2018-03-07 DIAGNOSIS — R350 Frequency of micturition: Secondary | ICD-10-CM | POA: Insufficient documentation

## 2018-03-07 DIAGNOSIS — M791 Myalgia, unspecified site: Secondary | ICD-10-CM | POA: Insufficient documentation

## 2018-03-07 LAB — URINE CULTURE
MICRO NUMBER: 91515008
SPECIMEN QUALITY: ADEQUATE

## 2018-03-07 NOTE — Assessment & Plan Note (Signed)
Overall patient is doing quite well.  I do suspect some of his achiness is related to his postsurgical course though we will check lab work to evaluate myalgias.

## 2018-03-07 NOTE — Assessment & Plan Note (Signed)
He will discuss with his neurologist at his upcoming visit.

## 2018-03-07 NOTE — Assessment & Plan Note (Signed)
Numbness and tingling in his fingers and feet could be related to prediabetes versus a B12 deficiency.  We will check an A1c and a B12.  He will discuss this with his neurologist as well when he follows up.

## 2018-03-07 NOTE — Assessment & Plan Note (Signed)
Check labs as outlined below. 

## 2018-03-07 NOTE — Assessment & Plan Note (Signed)
Some of his fibromyalgia symptoms may be related to this.  He is status post surgery for hyperparathyroidism.  Symptoms have improved.

## 2018-03-07 NOTE — Assessment & Plan Note (Signed)
Continue Ambien. 

## 2018-03-07 NOTE — Assessment & Plan Note (Signed)
He will discuss his medications with neurology.  He may not need all 3 medications for his fibromyalgia now that he has had his parathyroidectomy.

## 2018-03-07 NOTE — Assessment & Plan Note (Signed)
Check urinalysis. 

## 2018-03-08 ENCOUNTER — Telehealth: Payer: Self-pay | Admitting: Family Medicine

## 2018-03-08 NOTE — Telephone Encounter (Signed)
Called patient and left a VM to call back. CRM created and sent to [facility] pool.  

## 2018-03-08 NOTE — Telephone Encounter (Signed)
Copied from [place] [id number]. Topic: General - Other >> Mar 08, 2018 11:31 AM Lennox Solders wrote: Reason for CRM: pt saw dr Caryl Bis on 03-06-18 and pt is calling to see [facility] only #30 amlodipine. Pt needs #90 w/[facility]. This is medication medication

## 2018-03-08 NOTE — Telephone Encounter (Signed)
Copied from  [phone]. Topic: Quick Communication - Lab Results (Clinic Use ONLY) >> Mar 08, 2018 11:35 AM Lennox Solders wrote: Pt is calling and would like blood work results from 03-06-18

## 2018-03-14 ENCOUNTER — Other Ambulatory Visit: Payer: Self-pay | Admitting: Family Medicine

## 2018-03-14 DIAGNOSIS — I10 Essential (primary) hypertension: Secondary | ICD-10-CM

## 2018-03-14 MED ORDER — AMLODIPINE BESYLATE 5 MG PO TABS: 5.0000 mg | ORAL_TABLET | Freq: Every day | ORAL | 1 refills | Status: DC

## 2018-03-14 NOTE — Telephone Encounter (Signed)
Copied from [place] [phone]. Topic: Quick Communication - Rx Refill/Question >> Mar 14, 2018  2:21 PM Carolyn Stare wrote: Medication   amLODipine (NORVASC) 5 MG tablet    only 30 pills was sent in on 02/28/18  and pt said he need a 90 day supply     Has the patient contacted their pharmacy   yes   Preferred Pharmacy  [facility]    Agent: Please be advised that RX refills may take up to 3 business days. We ask that you follow-up with your pharmacy.

## 2018-03-22 ENCOUNTER — Other Ambulatory Visit (INDEPENDENT_AMBULATORY_CARE_PROVIDER_SITE_OTHER): Payer: Medicare Other

## 2018-03-22 DIAGNOSIS — E538 Deficiency of other specified B group vitamins: Secondary | ICD-10-CM

## 2018-03-27 LAB — METHYLMALONIC ACID, SERUM: METHYLMALONIC ACID, QUANT: 235 nmol/L (ref 87–318)

## 2018-03-27 LAB — HOMOCYSTEINE: HOMOCYSTEINE: 13.7 umol/L — AB (ref <11.4)

## 2018-04-04 ENCOUNTER — Other Ambulatory Visit: Payer: Self-pay | Admitting: Family Medicine

## 2018-04-04 DIAGNOSIS — R7989 Other specified abnormal findings of blood chemistry: Secondary | ICD-10-CM

## 2018-04-04 DIAGNOSIS — E7211 Homocystinuria: Principal | ICD-10-CM

## 2018-04-10 DIAGNOSIS — E559 Vitamin D deficiency, unspecified: Secondary | ICD-10-CM | POA: Insufficient documentation

## 2018-04-10 DIAGNOSIS — M810 Age-related osteoporosis without current pathological fracture: Secondary | ICD-10-CM | POA: Insufficient documentation

## 2018-04-11 ENCOUNTER — Other Ambulatory Visit: Payer: Self-pay | Admitting: Nephrology

## 2018-04-11 ENCOUNTER — Other Ambulatory Visit (INDEPENDENT_AMBULATORY_CARE_PROVIDER_SITE_OTHER): Payer: Medicare Other

## 2018-04-11 DIAGNOSIS — E7211 Homocystinuria: Secondary | ICD-10-CM | POA: Diagnosis not present

## 2018-04-11 DIAGNOSIS — R809 Proteinuria, unspecified: Secondary | ICD-10-CM

## 2018-04-11 DIAGNOSIS — R319 Hematuria, unspecified: Secondary | ICD-10-CM

## 2018-04-11 DIAGNOSIS — R7989 Other specified abnormal findings of blood chemistry: Secondary | ICD-10-CM

## 2018-04-11 LAB — FOLATE: Folate: 21.5 ng/mL (ref >5.9)

## 2018-04-18 ENCOUNTER — Ambulatory Visit
Admission: RE | Admit: 2018-04-18 | Discharge: 2018-04-18 | Disposition: A | Discharge status: Home / Self Care | Payer: Medicare Other | Source: Ambulatory Visit | Attending: Nephrology | Admitting: Nephrology

## 2018-04-18 DIAGNOSIS — R319 Hematuria, unspecified: Secondary | ICD-10-CM | POA: Insufficient documentation

## 2018-04-18 DIAGNOSIS — R809 Proteinuria, unspecified: Secondary | ICD-10-CM | POA: Diagnosis present

## 2018-04-22 DIAGNOSIS — R200 Anesthesia of skin: Secondary | ICD-10-CM | POA: Insufficient documentation

## 2018-04-22 DIAGNOSIS — R202 Paresthesia of skin: Secondary | ICD-10-CM

## 2018-05-28 ENCOUNTER — Other Ambulatory Visit: Payer: Self-pay | Admitting: Family Medicine

## 2018-05-28 DIAGNOSIS — F5101 Primary insomnia: Secondary | ICD-10-CM

## 2018-05-28 NOTE — Telephone Encounter (Signed)
Refilled: 02/28/2018 Last OV: 03/06/2018 Next OV: 08/16/2018

## 2018-06-17 ENCOUNTER — Telehealth: Payer: Self-pay | Admitting: Family Medicine

## 2018-06-17 NOTE — Telephone Encounter (Signed)
Called pt and left a VM to call back. CRM created and sent to [facility] pool. 

## 2018-06-17 NOTE — Telephone Encounter (Signed)
Please call the patient. Please let him know that I reviewed a note I received from Dr Candiss Norse whom he saw for microscopic blood in his urine. It appears that they did not find a cause related to his kidneys. It looks like he was supposed to follow-up with his urologist, Dr Bernardo Heater, in February though this did not happen. I wanted to see if he had follow-up planned with urology.

## 2018-06-18 NOTE — Telephone Encounter (Signed)
Called pt and left a VM to call back.  

## 2018-06-18 NOTE — Telephone Encounter (Signed)
Pt called and left a voice mail that he would like a call back from [place]. Please advise

## 2018-06-18 NOTE — Telephone Encounter (Signed)
Called and spoke with pt. Pt advised and voiced understanding. Pt does not have any follow up appts scheduled with Dr.Stoioff the last time he was seen by Dr.Stoioff was back in November of 2019 pt stated that he might need a referral he didn't realize  that he needed to see Dr. Bernardo Heater

## 2018-06-25 ENCOUNTER — Telehealth: Payer: Self-pay | Admitting: Urology

## 2018-06-25 NOTE — Telephone Encounter (Signed)
APP made and patient is aware    Aaron Quinn

## 2018-06-25 NOTE — Telephone Encounter (Signed)
He is 2 months overdue for surveillance cystoscopy for bladder cancer.  Would schedule him within the next month.

## 2018-06-25 NOTE — Telephone Encounter (Signed)
Please advise on post op appt, thanks

## 2018-06-25 NOTE — Telephone Encounter (Signed)
Pt called and "wants to make a follow up from his bladder surgery" I'm not sure when to make it for with the [place] going on.

## 2018-07-09 ENCOUNTER — Ambulatory Visit (INDEPENDENT_AMBULATORY_CARE_PROVIDER_SITE_OTHER): Payer: Medicare Other | Admitting: Urology

## 2018-07-09 ENCOUNTER — Other Ambulatory Visit: Payer: Self-pay

## 2018-07-09 ENCOUNTER — Encounter: Payer: Self-pay | Admitting: Urology

## 2018-07-09 VITALS — BP 134/63 | HR 66 | Ht 69.5 in | Wt 174.1 lb

## 2018-07-09 DIAGNOSIS — C679 Malignant neoplasm of bladder, unspecified: Secondary | ICD-10-CM

## 2018-07-09 LAB — URINALYSIS, COMPLETE
Bilirubin, UA: NEGATIVE
Glucose, UA: NEGATIVE
Ketones, UA: NEGATIVE
Nitrite, UA: NEGATIVE
Specific Gravity, UA: 1.02 (ref 1.005–1.030)
Urobilinogen, Ur: 0.2 mg/dL (ref 0.2–1.0)
pH, UA: 5.5 (ref 5.0–7.5)

## 2018-07-09 LAB — MICROSCOPIC EXAMINATION: WBC, UA: >30 /hpf — AB (ref 0–5)

## 2018-07-09 MED ORDER — LIDOCAINE HCL URETHRAL/MUCOSAL 2 % EX GEL
1.0000 "application " | Freq: Once | CUTANEOUS | Status: AC
  Administered 2018-07-09: 1 via URETHRAL

## 2018-07-09 NOTE — Progress Notes (Signed)
   07/09/18  CC:  Chief Complaint  Patient presents with  . Cysto   Urologic history: -HG Ta urothelial carcinoma November 2018 -Induction BCG completed February 2019  HPI:  Blood pressure 134/63, pulse 66, height 5' 9.5" (1.765 m), weight 174 lb 1.6 oz (79 kg). NED. A&Ox3.   No respiratory distress     Cystoscopy Procedure Note  Patient identification was confirmed, informed consent was obtained, and patient was prepped using Betadine solution.  Lidocaine jelly was administered per urethral meatus.     Pre-Procedure: - Inspection reveals a normal caliber urethral meatus.  Procedure: The flexible cystoscope was introduced without difficulty - No urethral strictures/lesions are present. - Moderate lateral lobe enlargement prostate; no tumor - Mild elevation bladder neck - Bilateral ureteral orifices identified - Bladder mucosa  reveals no ulcers, tumors.  Posterior wall erythema present - No bladder stones - No trabeculation  Retroflexion shows no significant abnormalities   Post-Procedure: - Patient tolerated the procedure well  Assessment/ Plan: Mild posterior wall bladder erythema.  Urine cytology was sent and if negative continue quarterly surveillance.  If cytology abnormal would recommend cystoscopy/bladder biopsy.  Return in about 3 months (around 10/08/2018) for Cystoscopy.   Abbie Sons, MD

## 2018-07-15 ENCOUNTER — Other Ambulatory Visit: Payer: Self-pay | Admitting: Urology

## 2018-07-16 ENCOUNTER — Telehealth: Payer: Self-pay | Admitting: Urology

## 2018-07-16 NOTE — Telephone Encounter (Signed)
LMOM to return call.

## 2018-07-16 NOTE — Telephone Encounter (Signed)
Urine cytology was negative for abnormal cells.  Keep cystoscopy as scheduled

## 2018-07-16 NOTE — Telephone Encounter (Signed)
He has chronic pyuria.  As long as he is not having bothersome urinary symptoms would recommend continuing to monitor.

## 2018-07-16 NOTE — Telephone Encounter (Signed)
Notified patient of negative urine cytology and to keep cystoscopy appointment. Patient is concerned about his urinalysis results. He would like a return call to [phone].

## 2018-07-18 NOTE — Telephone Encounter (Signed)
Informed patient as directed-verbalized understanding.

## 2018-08-16 ENCOUNTER — Other Ambulatory Visit: Payer: Self-pay | Admitting: Family Medicine

## 2018-08-16 ENCOUNTER — Telehealth: Payer: Self-pay | Admitting: Family Medicine

## 2018-08-16 ENCOUNTER — Encounter: Payer: Self-pay | Admitting: Family Medicine

## 2018-08-16 ENCOUNTER — Other Ambulatory Visit: Payer: Self-pay

## 2018-08-16 ENCOUNTER — Ambulatory Visit (INDEPENDENT_AMBULATORY_CARE_PROVIDER_SITE_OTHER): Payer: Medicare Other

## 2018-08-16 ENCOUNTER — Ambulatory Visit (INDEPENDENT_AMBULATORY_CARE_PROVIDER_SITE_OTHER): Payer: Medicare Other | Admitting: Family Medicine

## 2018-08-16 DIAGNOSIS — F5101 Primary insomnia: Secondary | ICD-10-CM

## 2018-08-16 DIAGNOSIS — M797 Fibromyalgia: Secondary | ICD-10-CM

## 2018-08-16 DIAGNOSIS — I712 Thoracic aortic aneurysm, without rupture, unspecified: Secondary | ICD-10-CM

## 2018-08-16 DIAGNOSIS — M810 Age-related osteoporosis without current pathological fracture: Secondary | ICD-10-CM

## 2018-08-16 DIAGNOSIS — Z0000 Encounter for general adult medical examination without abnormal findings: Secondary | ICD-10-CM

## 2018-08-16 DIAGNOSIS — L299 Pruritus, unspecified: Secondary | ICD-10-CM

## 2018-08-16 DIAGNOSIS — E785 Hyperlipidemia, unspecified: Secondary | ICD-10-CM

## 2018-08-16 DIAGNOSIS — I10 Essential (primary) hypertension: Secondary | ICD-10-CM

## 2018-08-16 DIAGNOSIS — C679 Malignant neoplasm of bladder, unspecified: Secondary | ICD-10-CM

## 2018-08-16 DIAGNOSIS — R7303 Prediabetes: Secondary | ICD-10-CM

## 2018-08-16 DIAGNOSIS — I77810 Thoracic aortic ectasia: Secondary | ICD-10-CM | POA: Insufficient documentation

## 2018-08-16 MED ORDER — TRIAMCINOLONE ACETONIDE 0.1 % EX CREA: 1.0000 "application " | TOPICAL_CREAM | Freq: Two times a day (BID) | CUTANEOUS | 0 refills | Status: DC

## 2018-08-16 NOTE — Assessment & Plan Note (Signed)
Plan for repeat CT angiogram chest in September.  Advised if he develops chest pain or breathing issues he needs to be evaluated immediately.

## 2018-08-16 NOTE — Assessment & Plan Note (Signed)
Adequately controlled.  Continue current regimen.  He will come in for lab work in several months.

## 2018-08-16 NOTE — Telephone Encounter (Signed)
Last filled 05/28/18 for 90 no refills, last OV today.

## 2018-08-16 NOTE — Assessment & Plan Note (Signed)
Check A1c in August.

## 2018-08-16 NOTE — Assessment & Plan Note (Signed)
Focal itching of ankle.  We will refill his triamcinolone.  He will discuss this with his dermatologist to determine if he needs a biopsy.  Prior bilirubin normal and given that this is so focal I do not think this is a systemic issue and is more likely to be a dermatologic issue.

## 2018-08-16 NOTE — Progress Notes (Signed)
Virtual Visit via video Note  This visit type was conducted due to national recommendations for restrictions regarding the COVID-19 pandemic (e.g. social distancing).  This format is felt to be most appropriate for this patient at this time.  All issues noted in this document were discussed and addressed.  No physical exam was performed (except for noted visual exam findings with Video Visits).   I connected with Aaron Quinn today at 10:30 AM EDT by a video enabled telemedicine application or telephone and verified that I am speaking with the correct person using two identifiers. Location patient: home Location provider: work Persons participating in the virtual visit: patient, provider, Kashmere Staffa (wife)  I discussed the limitations, risks, security and privacy concerns of performing an evaluation and management service by telephone and the availability of in person appointments. I also discussed with the patient that there may be a patient responsible charge related to this service. The patient expressed understanding and agreed to proceed.   Reason for visit: Follow-up.  HPI: Insomnia: Taking Ambien nightly.  He gets up to 10 hours of sleep with this.  No drowsiness the next day.  No alcohol intake.  Osteoporosis: Is following with endocrinology for this.  He is currently on calcium, vitamin D, and Fosamax.  Hypertension: Typically in the 130s over 70s.  Taking amlodipine.  No chest pain, shortness of breath, or edema.  Aortic dilatation: Noted on prior CT angiogram.  They recommended repeat in 1 year.  He will be due towards the end of August.  Fibromyalgia: Patient reports this is stable.  He does not have a whole lot of energy or strength though this has not changed.  Ankle itching: Patient reports a spot on his left ankle that is about the size of 2 $0.50 pieces that itches.  There is no rash in this area.  Notes it has been going on intermittently for the last year.  He  notes no itching elsewhere.  He will use triamcinolone on this and this resolves the itching for 2 to 3 days.   ROS: See pertinent positives and negatives per HPI.  Past Medical History:  Diagnosis Date  . Arthritis   . Cancer ([place]) 2018   bladder cancer  . Complication of anesthesia    slow to get rid of anesthesia, over a couple of days.  . Diverticulosis   . Fibromyalgia 1995  . Glaucoma    pt denies  . History of bladder cancer 2018  . History of kidney stones   . Hyperlipidemia   . Hyperparathyroidism ([place]) 10/2017  . Hypertension   . Positive TB test   . Tubulovillous adenoma of colon     Past Surgical History:  Procedure Laterality Date  . APPENDECTOMY  2005  . CATARACT EXTRACTION, BILATERAL  2012  . COLONOSCOPY W/ BIOPSIES    . CYST EXCISION Right 1950's   thigh  . HEMORRHOID BANDING    . KNEE ARTHROSCOPY Left 2014  . KNEE CARTILAGE SURGERY Left 1951  . NASAL SINUS SURGERY  1950's  . TONSILLECTOMY  1940  . TOTAL HIP ARTHROPLASTY Right 11/13/2017   Procedure: TOTAL HIP ARTHROPLASTY;  Surgeon: Corky Mull, MD;  Location: [facility] ORS;  Service: Orthopedics;  Laterality: Right;  . TRANSURETHRAL RESECTION OF BLADDER TUMOR N/A 02/13/2017   Procedure: TRANSURETHRAL RESECTION OF BLADDER TUMOR (TURBT);  Surgeon: Abbie Sons, MD;  Location: [facility] ORS;  Service: Urology;  Laterality: N/A;  45 minutes needed  . [place]  Family History  Problem Relation Quinn of Onset  . Colon cancer Maternal Grandmother 29       Died at 22  . Heart disease Father   . Arthritis Other        Parent  . Hypertension Other        Parent, grandparent  . Diabetes Son   . Alcohol abuse Son   . Depression Son   . Liver cancer Mother   . Rheum arthritis Mother   . Colon polyps Neg Hx   . Kidney disease Neg Hx   . Esophageal cancer Neg Hx   . Gallbladder disease Neg Hx     SOCIAL HX: Former smoker   Current Outpatient Medications:  .  alendronate (FOSAMAX) 70 MG tablet,  One tab every Sunday on empty stomach with a full glass of water. Do not lie down or eat/drink anything else for the next 30 min., Disp: , Rfl:  .  amLODipine (NORVASC) 5 MG tablet, Take 1 tablet (5 mg total) by mouth daily., Disp: 90 tablet, Rfl: 1 .  Calcium Carb-Cholecalciferol (CALTRATE 600+D3) 600-800 MG-UNIT TABS, Take by mouth 2 (two) times a day., Disp: , Rfl:  .  calcium carbonate (TUMS EX) 750 MG chewable tablet, Chew by mouth., Disp: , Rfl:  .  Cholecalciferol (VITAMIN D3) 50 MCG (2000 UT) capsule, Take by mouth., Disp: , Rfl:  .  citalopram (CELEXA) 20 MG tablet, Take 20 mg by mouth daily., Disp: , Rfl:  .  donepezil (ARICEPT) 10 MG tablet, Take 10 mg by mouth every evening., Disp: , Rfl:  .  fluticasone (FLONASE) 50 MCG/ACT nasal spray, Place 1 spray into both nostrils daily as needed for allergies. , Disp: , Rfl:  .  gabapentin (NEURONTIN) 300 MG capsule, Take 300 mg by mouth 4 (four) times daily. , Disp: , Rfl:  .  ketoconazole (NIZORAL) 2 % shampoo, Apply 1 application topically once a week. , Disp: , Rfl: 2 .  loratadine (CLARITIN) 10 MG tablet, Take 10 mg by mouth daily., Disp: , Rfl:  .  LYRICA 25 MG capsule, Take 25 mg by mouth 3 (three) times daily. , Disp: , Rfl: 5 .  tamsulosin (FLOMAX) 0.4 MG CAPS capsule, Take by mouth., Disp: , Rfl:  .  zolpidem (AMBIEN) 10 MG tablet, TAKE 1 TABLET (10 MG TOTAL) BY MOUTH AT BEDTIME AS NEEDED FOR SLEEP., Disp: 90 tablet, Rfl: 0 .  triamcinolone cream (KENALOG) 0.1 %, Apply 1 application topically 2 (two) times daily., Disp: 30 g, Rfl: 0 .  Vitamin D, Ergocalciferol, (DRISDOL) 1.25 MG (50000 UT) CAPS capsule, Take by mouth., Disp: , Rfl:   EXAM:  VITALS per patient if applicable: None.  GENERAL: alert, oriented, appears well and in no acute distress  HEENT: atraumatic, conjunttiva clear, no obvious abnormalities on inspection of external nose and ears  NECK: normal movements of the head and neck  LUNGS: on inspection no signs of  respiratory distress, breathing rate appears normal, no obvious gross SOB, gasping or wheezing  CV: no obvious cyanosis  MS: moves all visible extremities without noticeable abnormality  PSYCH/NEURO: pleasant and cooperative, no obvious depression or anxiety, speech and thought processing grossly intact  ASSESSMENT AND PLAN:  Discussed the following assessment and plan:  Essential hypertension - Plan: Comprehensive metabolic panel  Thoracic aortic aneurysm without rupture (HCC) - Plan: CT ANGIO CHEST AORTA W/CM &/OR WO/CM  Hyperlipidemia, unspecified hyperlipidemia type - Plan: Lipid panel  Prediabetes - Plan: HgB A1c  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  Continue current regimen.  He will come in for lab work in several months.  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  He will continue Fosamax.  Urothelial carcinoma of bladder [facility]) He will continue to follow with urology.  Fibromyalgia Stable.  He will monitor.  HLD (hyperlipidemia) Check lipid panel in August.  Prediabetes Check A1c in August.  Dilatation of thoracic aorta ([place]) Plan for repeat CT angiogram chest in September.  Advised if he develops chest pain or breathing issues he needs to be evaluated immediately.  Itching Focal itching of ankle.  We will refill his triamcinolone.  He will discuss this with his dermatologist to determine if he needs a biopsy.  Prior bilirubin normal and given that this is so focal I do not think this is a systemic issue and is more likely to be a dermatologic issue.  Insomnia Stable.  Continue Ambien.  CMA will contact patient to schedule follow-up in 4 months.  Labs to be completed in August.  Social  distancing precautions and sick precautions given regarding COVID-19.   I discussed the assessment and treatment plan with the patient. The patient was provided an opportunity to ask questions and all were answered. The patient agreed with the plan and demonstrated an understanding of the instructions.   The patient was advised to call back or seek an in-person evaluation if the symptoms worsen or if the condition fails to improve as anticipated.   [name], MD

## 2018-08-16 NOTE — Assessment & Plan Note (Signed)
Stable.  Continue Ambien. 

## 2018-08-16 NOTE — Assessment & Plan Note (Signed)
Patient will continue to follow with endocrinology.  He will continue Fosamax.

## 2018-08-16 NOTE — Assessment & Plan Note (Signed)
Check lipid panel in August.

## 2018-08-16 NOTE — Telephone Encounter (Signed)
Please get the patient scheduled for follow-up in 4 months and get lab work scheduled in August.  Thanks.

## 2018-08-16 NOTE — Progress Notes (Signed)
Subjective:   Aaron Quinn is a 85 y.o. male who presents for Medicare Annual (Subsequent) preventive examination.  Review of Systems:  No ROS.  Medicare Wellness Virtual Visit.  Visual/audio telehealth visit, UTA vital signs.   See social history for additional risk factors.   Cardiac Risk Factors include: advanced age (>6men, >47 women);hypertension;male gender     Objective:     Vitals: There were no vitals taken for this visit.  There is no height or weight on file to calculate BMI.  Advanced Directives 08/16/2018 11/13/2017 11/13/2017 10/31/2017 06/12/2017 04/20/2017 02/17/2017  Does Patient Have a Medical Advance Directive? Yes Yes Yes Yes Yes Yes Yes  Type of Advance Directive Living will;Healthcare Power of [place];Living will [place];Living will [place];Living will Living will;Healthcare Power of Person;Living will -  Does patient want to make changes to medical advance directive? No - Patient declined No - Patient declined No - Patient declined No - Patient declined - No - Patient declined -  Copy of [place] in Chart? Yes - validated most recent copy scanned in chart (See row information) Yes Yes No - copy requested No - copy requested No - copy requested -    Tobacco Social History   Tobacco Use  Smoking Status Former Smoker  . Packs/day: 0.50  . Years: 20.00  . Pack years: 10.00  . Types: Cigarettes  . Last attempt to quit: 03/20/1982  . Years since quitting: 36.4  Smokeless Tobacco Never Used     Counseling given: Not Answered   Clinical Intake:  Pre-visit preparation completed: Yes        Diabetes: No  How often do you need to have someone help you when you read instructions, pamphlets, or other written materials from your doctor or pharmacy?: 1 - Never  Interpreter Needed?: No     Past Medical History:  Diagnosis Date  .  Arthritis   . Cancer ([place]) 2018   bladder cancer  . Complication of anesthesia    slow to get rid of anesthesia, over a couple of days.  . Diverticulosis   . Fibromyalgia 1995  . Glaucoma    pt denies  . History of bladder cancer 2018  . History of kidney stones   . Hyperlipidemia   . Hyperparathyroidism ([place]) 10/2017  . Hypertension   . Positive TB test   . Tubulovillous adenoma of colon    Past Surgical History:  Procedure Laterality Date  . APPENDECTOMY  2005  . CATARACT EXTRACTION, BILATERAL  2012  . COLONOSCOPY W/ BIOPSIES    . CYST EXCISION Right 1950's   thigh  . HEMORRHOID BANDING    . KNEE ARTHROSCOPY Left 2014  . KNEE CARTILAGE SURGERY Left 1951  . NASAL SINUS SURGERY  1950's  . TONSILLECTOMY  1940  . TOTAL HIP ARTHROPLASTY Right 11/13/2017   Procedure: TOTAL HIP ARTHROPLASTY;  Surgeon: Corky Mull, MD;  Location: [facility] ORS;  Service: Orthopedics;  Laterality: Right;  . TRANSURETHRAL RESECTION OF BLADDER TUMOR N/A 02/13/2017   Procedure: TRANSURETHRAL RESECTION OF BLADDER TUMOR (TURBT);  Surgeon: Abbie Sons, MD;  Location: [facility] ORS;  Service: Urology;  Laterality: N/A;  45 minutes needed  . VASECTOMY  1972   Family History  Problem Relation Age of Onset  . Colon cancer Maternal Grandmother 24       Died at 23  . Heart disease Father   .  Arthritis Other        Parent  . Hypertension Other        Parent, grandparent  . Diabetes Son   . Alcohol abuse Son   . Depression Son   . Liver cancer Mother   . Rheum arthritis Mother   . Colon polyps Neg Hx   . Kidney disease Neg Hx   . Esophageal cancer Neg Hx   . Gallbladder disease Neg Hx    Social History   Socioeconomic History  . Marital status: Married    Spouse name: Not on file  . Number of children: 2  . Years of education: Not on file  . Highest education level: Not on file  Occupational History  . Occupation: Retired  Scientific laboratory technician  . Financial resource strain: Not hard at all  . Food  insecurity:    Worry: Never true    Inability: Never true  . Transportation needs:    Medical: No    Non-medical: No  Tobacco Use  . Smoking status: Former Smoker    Packs/day: 0.50    Years: 20.00    Pack years: 10.00    Types: Cigarettes    Last attempt to quit: 03/20/1982    Years since quitting: 36.4  . Smokeless tobacco: Never Used  Substance and Sexual Activity  . Alcohol use: No    Alcohol/week: 0.0 standard drinks  . Drug use: No  . Sexual activity: Yes  Lifestyle  . Physical activity:    Days per week: 3 days    Minutes per session: 40 min  . Stress: Not at all  Relationships  . Social connections:    Talks on phone: Not on file    Gets together: Not on file    Attends religious service: Not on file    Active member of club or organization: Not on file    Attends meetings of clubs or organizations: Not on file    Relationship status: Not on file  Other Topics Concern  . Not on file  Social History Narrative   Retired from [employer] Encounter Medications as of 08/16/2018  Medication Sig  . alendronate (FOSAMAX) 70 MG tablet One tab every Sunday on empty stomach with a full glass of water. Do not lie down or eat/drink anything else for the next 30 min.  Marland Kitchen amLODipine (NORVASC) 5 MG tablet Take 1 tablet (5 mg total) by mouth daily.  . Calcium Carb-Cholecalciferol (CALTRATE 600+D3) 600-800 MG-UNIT TABS Take by mouth 2 (two) times a day.  . citalopram (CELEXA) 20 MG tablet Take 20 mg by mouth daily.  Marland Kitchen donepezil (ARICEPT) 10 MG tablet Take 10 mg by mouth every evening.  . fluticasone (FLONASE) 50 MCG/ACT nasal spray Place 1 spray into both nostrils daily as needed for allergies.   Marland Kitchen gabapentin (NEURONTIN) 300 MG capsule Take 300 mg by mouth 4 (four) times daily.   Marland Kitchen ketoconazole (NIZORAL) 2 % shampoo Apply 1 application topically once a week.   . loratadine (CLARITIN) 10 MG tablet Take 10 mg by mouth daily.  Marland Kitchen LYRICA 25 MG capsule Take 25 mg by  mouth 3 (three) times daily.   . tamsulosin (FLOMAX) 0.4 MG CAPS capsule Take by mouth.  . Vitamin D, Ergocalciferol, (DRISDOL) 1.25 MG (50000 UT) CAPS capsule Take by mouth.  . zolpidem (AMBIEN) 10 MG tablet TAKE 1 TABLET (10 MG TOTAL) BY MOUTH AT BEDTIME AS NEEDED FOR SLEEP.   No facility-administered encounter  medications on file as of 08/16/2018.     Activities of Daily Living In your present state of health, do you have any difficulty performing the following activities: 08/16/2018 11/13/2017  Hearing? N N  Vision? N N  Difficulty concentrating or making decisions? Y N  Comment Notes he has some memory recall changes but believes to be age appropriate. -  Walking or climbing stairs? N Y  Comment - -  Dressing or bathing? N N  Doing errands, shopping? N N  Preparing Food and eating ? N -  Using the Toilet? N -  In the past six months, have you accidently leaked urine? N -  Comment Followed by Dr. Bernardo Heater. -  Do you have problems with loss of bowel control? N -  Managing your Medications? N -  Managing your Finances? N -  Housekeeping or managing your Housekeeping? N -  Some recent data might be hidden    Patient Care Team: Leone Haven, MD as PCP - General (Family Medicine)    Assessment:   This is a routine wellness examination for [place].   I connected with patient 08/16/18 at 11:00 AM EDT by a video/audio enabled telemedicine application and verified that I am speaking with the correct person using two identifiers. Patient stated full name and DOB. Patient gave permission to continue with virtual visit. Patient's location was at home and Nurse's location was at [place] office.   Health Screenings  Colonoscopy - 06/2016 Glaucoma -none Hearing -demonstrates normal hearing during visit. POCT Hemoglobin A1C - 02/2018 (5.6) Cholesterol - 10/2017 (126) TSH- 02/2018 (2.24) Dental- every 4 months Vision- visits within the last 12 months.  Social  Alcohol intake - no         Smoking history- former  Smokers in home? none Illicit drug use? none Exercise - walking 3 days weekly, 35 minutes Diet - regular Sexually Active -yes BMI- discussed the importance of a healthy diet, water intake and the benefits of aerobic exercise.  Educational material provided.   Safety  Patient feels safe at home- yes Patient does have smoke detectors at home- yes Patient does wear sunscreen or protective clothing when in direct sunlight -yes Patient does wear seat belt when in a moving vehicle -yes  Covid-19 precautions and sickness symptoms discussed.   Activities of Daily Living Patient denies needing assistance with: driving, household chores, feeding themselves, getting from bed to chair, getting to the toilet, bathing/showering, dressing, managing money, or preparing meals.  No new identified risk were noted.    Depression Screen Patient denies losing interest in daily life, feeling hopeless, or crying easily over simple problems.   Medication-taking as directed and without issues.   Fall Screen Patient denies being afraid of falling or falling in the last year.   Memory Screen Patient is alert.  Correctly identified the president of the [place] , current season and months of the year in reverse.  Patient likes to read for brain stimulation.  Immunizations The following Immunizations were discussed: Influenza, shingles, pneumonia, and tetanus.   Other Providers Patient Care Team: Leone Haven, MD as PCP - General (Family Medicine)  Exercise Activities and Dietary recommendations Current Exercise Habits: Home exercise routine, Type of exercise: walking(Yard work and walks 1.5 miles with wife. ), Time (Minutes): 35, Frequency (Times/Week): 3, Weekly Exercise (Minutes/Week): 105, Intensity: Mild  Goals      Patient Stated   . Follow up with Primary Care Provider (pt-stated)     Take all  medications as prescribed Eat a healthy diet        Fall Risk  Fall Risk  03/06/2018 04/20/2017 04/11/2016  Falls in the past year? 0 No No  Number falls in past yr: 0 - -  Injury with Fall? 0 - -   Depression Screen PHQ 2/9 Scores 08/16/2018 03/06/2018 04/20/2017 04/11/2016  PHQ - 2 Score 0 0 0 0     Cognitive Function     6CIT Screen 08/16/2018 04/20/2017 04/11/2016  What Year? 0 points 0 points 0 points  What month? 0 points 0 points 0 points  What time? 0 points 0 points 0 points  Count back from 20 0 points 0 points 0 points  Months in reverse 0 points 0 points 0 points  Repeat phrase 0 points - 0 points  Total Score 0 - 0    Immunization History  Administered Date(s) Administered  . Influenza, High Dose Seasonal PF 01/07/2016, 12/20/2016, 01/16/2018  . Influenza-Unspecified 12/18/2014, 12/20/2016  . Pneumococcal Conjugate-13 04/20/2017  . Pneumococcal Polysaccharide-23 05/23/2018  . Tdap 04/09/2015, 08/31/2017  . Zoster Recombinat (Shingrix) 12/20/2016, 04/20/2017   Screening Tests Health Maintenance  Topic Date Due  . INFLUENZA VACCINE  10/19/2018  . TETANUS/TDAP  09/01/2027  . PNA vac Low Risk Adult  Completed      Plan:    End of life planning; Advance aging; Advanced directives discussed.  Copy of current HCPOA/Living Will on file.    I have personally reviewed and noted the following in the patient's chart:   . Medical and social history . Use of alcohol, tobacco or illicit drugs  . Current medications and supplements . Functional ability and status . Nutritional status . Physical activity . Advanced directives . List of other physicians . Hospitalizations, surgeries, and ER visits in previous 12 months . Vitals . Screenings to include cognitive, depression, and falls . Referrals and appointments  In addition, I have reviewed and discussed with patient certain preventive protocols, quality metrics, and best practice recommendations. A written personalized care plan for preventive services as well as general preventive  health recommendations were provided to patient.     Varney Biles, LPN  [id number]

## 2018-08-16 NOTE — Patient Instructions (Addendum)
  Aaron Quinn , Thank you for taking time to come for your Medicare Wellness Visit. I appreciate your ongoing commitment to your health goals. Please review the following plan we discussed and let me know if I can assist you in the future.   These are the goals we discussed: Goals      Patient Stated   . Follow up with Primary Care Provider (pt-stated)     Take all medications as prescribed Eat a healthy diet        This is a list of the screening recommended for you and due dates:  Health Maintenance  Topic Date Due  . Flu Shot  10/19/2018  . Tetanus Vaccine  09/01/2027  . Pneumonia vaccines  Completed

## 2018-08-16 NOTE — Assessment & Plan Note (Signed)
Stable.  He will monitor.

## 2018-08-16 NOTE — Assessment & Plan Note (Signed)
He will continue to follow with urology. 

## 2018-08-20 NOTE — Progress Notes (Signed)
I have reviewed the above note and agree.  Zelma Snead, M.D.  

## 2018-10-09 ENCOUNTER — Other Ambulatory Visit: Payer: Medicare Other | Admitting: Urology

## 2018-10-11 ENCOUNTER — Other Ambulatory Visit: Payer: Self-pay | Admitting: Family Medicine

## 2018-10-11 DIAGNOSIS — I10 Essential (primary) hypertension: Secondary | ICD-10-CM

## 2018-10-22 ENCOUNTER — Ambulatory Visit (INDEPENDENT_AMBULATORY_CARE_PROVIDER_SITE_OTHER): Payer: Medicare Other | Admitting: Urology

## 2018-10-22 ENCOUNTER — Other Ambulatory Visit: Payer: Self-pay

## 2018-10-22 ENCOUNTER — Encounter: Payer: Self-pay | Admitting: Urology

## 2018-10-22 VITALS — BP 126/62 | HR 60 | Ht 69.5 in | Wt 177.1 lb

## 2018-10-22 DIAGNOSIS — R399 Unspecified symptoms and signs involving the genitourinary system: Secondary | ICD-10-CM

## 2018-10-22 DIAGNOSIS — Z8551 Personal history of malignant neoplasm of bladder: Secondary | ICD-10-CM | POA: Diagnosis not present

## 2018-10-22 MED ORDER — LIDOCAINE HCL URETHRAL/MUCOSAL 2 % EX GEL
1.0000 "application " | Freq: Once | CUTANEOUS | Status: AC
  Administered 2018-10-22: 1 via URETHRAL

## 2018-10-22 NOTE — Progress Notes (Signed)
   10/26/18  CC:  Chief Complaint  Patient presents with  . Cysto    Urologic history: -HG Ta urothelial carcinoma November 2018 -Induction BCG completed February 2019  HPI: Mr. Fadeley presents for quarterly surveillance cystoscopy. Denies dysuria or gross hematuria  Cystoscopy Procedure Note  Patient identification was confirmed, informed consent was obtained, and patient was prepped using Betadine solution.  Lidocaine jelly was administered per urethral meatus.     Pre-Procedure: - Inspection reveals a normal caliber urethral meatus.  Procedure: The flexible cystoscope was introduced without difficulty - No urethral strictures/lesions are present. - Moderate lateral lobe enlargement prostate  - Mild elevation bladder neck - Bilateral ureteral orifices identified - Bladder mucosa  reveals intense erythema with mucosal edema posterior wall - No bladder stones -Moderate trabeculation  Retroflexion shows no other additional abnormalities   Post-Procedure: - Patient tolerated the procedure well  Assessment/ Plan: History high-grade urothelial carcinoma with abnormal mucosal findings posterior wall.  Findings were discussed in detail.  I recommended cystoscopy under anesthesia with TURBT.   Abbie Sons, MD

## 2018-10-23 LAB — URINALYSIS, COMPLETE
Bilirubin, UA: NEGATIVE
Glucose, UA: NEGATIVE
Ketones, UA: NEGATIVE
Nitrite, UA: NEGATIVE
Protein,UA: NEGATIVE
Specific Gravity, UA: 1.02 (ref 1.005–1.030)
Urobilinogen, Ur: 0.2 mg/dL (ref 0.2–1.0)
pH, UA: 6.5 (ref 5.0–7.5)

## 2018-10-23 LAB — MICROSCOPIC EXAMINATION: Bacteria, UA: NONE SEEN

## 2018-10-24 ENCOUNTER — Other Ambulatory Visit: Payer: Self-pay

## 2018-10-24 ENCOUNTER — Other Ambulatory Visit (INDEPENDENT_AMBULATORY_CARE_PROVIDER_SITE_OTHER): Payer: Medicare Other

## 2018-10-24 DIAGNOSIS — E785 Hyperlipidemia, unspecified: Secondary | ICD-10-CM | POA: Diagnosis not present

## 2018-10-24 DIAGNOSIS — R7303 Prediabetes: Secondary | ICD-10-CM | POA: Diagnosis not present

## 2018-10-24 DIAGNOSIS — I10 Essential (primary) hypertension: Secondary | ICD-10-CM

## 2018-10-24 LAB — COMPREHENSIVE METABOLIC PANEL
ALT: 9 U/L (ref 0–53)
AST: 16 U/L (ref 0–37)
Albumin: 4.6 g/dL (ref 3.5–5.2)
Alkaline Phosphatase: 77 U/L (ref 39–117)
BUN: 21 mg/dL (ref 6–23)
CO2: 28 mEq/L (ref 19–32)
Calcium: 8.9 mg/dL (ref 8.4–10.5)
Chloride: 102 mEq/L (ref 96–112)
Creatinine, Ser: 1.12 mg/dL (ref 0.40–1.50)
GFR: 62.31 mL/min (ref >60.00)
Glucose, Bld: 94 mg/dL (ref 70–99)
Potassium: 4.1 mEq/L (ref 3.5–5.1)
Sodium: 138 mEq/L (ref 135–145)
Total Bilirubin: 0.6 mg/dL (ref 0.2–1.2)
Total Protein: 7 g/dL (ref 6.0–8.3)

## 2018-10-24 LAB — HEMOGLOBIN A1C: Hgb A1c MFr Bld: 6.1 % (ref 4.6–6.5)

## 2018-10-24 LAB — LIPID PANEL
Cholesterol: 195 mg/dL (ref 0–200)
HDL: 42.7 mg/dL (ref >39.00)
LDL Cholesterol: 117 mg/dL — ABNORMAL HIGH (ref 0–99)
NonHDL: 152.69
Total CHOL/HDL Ratio: 5
Triglycerides: 178 mg/dL — ABNORMAL HIGH (ref 0.0–149.0)
VLDL: 35.6 mg/dL (ref 0.0–40.0)

## 2018-10-26 ENCOUNTER — Encounter: Payer: Self-pay | Admitting: Urology

## 2018-10-26 NOTE — Progress Notes (Signed)
10/22/2018 11:17 AM   Aaron Quinn [birth year] [id number]  Referring provider: Leone Haven, MD [address] [place],  [place] [address]  Chief Complaint  Patient presents with  . Cysto    Urologic history: -HG Ta urothelial carcinoma November 2018 -Induction BCG completed February 315   HPI: 85 year old male with history of high-grade urothelial carcinoma bladder who on surveillance cystoscopy today was noted to have intense posterior wall mucosal erythema with edema.  Cystoscopy with TURBT was recommended.   PMH: Past Medical History:  Diagnosis Date  . Arthritis   . Cancer ([place]) 2018   bladder cancer  . Complication of anesthesia    slow to get rid of anesthesia, over a couple of days.  . Diverticulosis   . Fibromyalgia 1995  . Glaucoma    pt denies  . History of bladder cancer 2018  . History of kidney stones   . Hyperlipidemia   . Hyperparathyroidism ([place]) 10/2017  . Hypertension   . Positive TB test   . Tubulovillous adenoma of colon     Surgical History: Past Surgical History:  Procedure Laterality Date  . APPENDECTOMY  2005  . CATARACT EXTRACTION, BILATERAL  2012  . COLONOSCOPY W/ BIOPSIES    . CYST EXCISION Right 1950's   thigh  . HEMORRHOID BANDING    . KNEE ARTHROSCOPY Left 2014  . KNEE CARTILAGE SURGERY Left 1951  . NASAL SINUS SURGERY  1950's  . TONSILLECTOMY  1940  . TOTAL HIP ARTHROPLASTY Right 11/13/2017   Procedure: TOTAL HIP ARTHROPLASTY;  Surgeon: Corky Mull, MD;  Location: [facility] ORS;  Service: Orthopedics;  Laterality: Right;  . TRANSURETHRAL RESECTION OF BLADDER TUMOR N/A 02/13/2017   Procedure: TRANSURETHRAL RESECTION OF BLADDER TUMOR (TURBT);  Surgeon: Abbie Sons, MD;  Location: [facility] ORS;  Service: Urology;  Laterality: N/A;  45 minutes needed  . VASECTOMY  1972    Home Medications:  Allergies as of 10/22/2018   No Known Allergies     Medication List       Accurate as of October 22, 2018 11:59 PM.  If you have any questions, ask your nurse or doctor.        STOP taking these medications   calcium carbonate 750 MG chewable tablet Commonly known as: TUMS EX Stopped by: Abbie Sons, MD   Vitamin D (Ergocalciferol) 1.25 MG (50000 UT) Caps capsule Commonly known as: DRISDOL Stopped by: Abbie Sons, MD     TAKE these medications   alendronate 70 MG tablet Commonly known as: FOSAMAX One tab every Sunday on empty stomach with a full glass of water. Do not lie down or eat/drink anything else for the next 30 min.   amLODipine 5 MG tablet Commonly known as: NORVASC TAKE 1 TABLET DAILY   Caltrate 600+D3 600-800 MG-UNIT Tabs Generic drug: Calcium Carb-Cholecalciferol Take by mouth 2 (two) times a day.   citalopram 20 MG tablet Commonly known as: CELEXA Take 20 mg by mouth daily.   donepezil 10 MG tablet Commonly known as: ARICEPT Take 10 mg by mouth every evening.   Flonase 50 MCG/ACT nasal spray Generic drug: fluticasone Place 1 spray into both nostrils daily as needed for allergies.   gabapentin 300 MG capsule Commonly known as: NEURONTIN Take 300 mg by mouth 4 (four) times daily.   ketoconazole 2 % shampoo Commonly known as: NIZORAL Apply 1 application topically once a week.   loratadine 10 MG tablet Commonly known as: CLARITIN  Take 10 mg by mouth daily.   Lyrica 25 MG capsule Generic drug: pregabalin Take 25 mg by mouth 3 (three) times daily.   tamsulosin 0.4 MG Caps capsule Commonly known as: FLOMAX Take by mouth.   triamcinolone cream 0.1 % Commonly known as: KENALOG Apply 1 application topically 2 (two) times daily.   Vitamin D3 50 MCG (2000 UT) capsule Take by mouth.   zolpidem 10 MG tablet Commonly known as: AMBIEN TAKE 1 TABLET (10 MG TOTAL) BY MOUTH AT BEDTIME AS NEEDED FOR SLEEP.       Allergies: No Known Allergies  Family History: Family History  Problem Relation Age of Onset  . Colon cancer Maternal Grandmother 68        Died at 103  . Heart disease Father   . Arthritis Other        Parent  . Hypertension Other        Parent, grandparent  . Diabetes Son   . Alcohol abuse Son   . Depression Son   . Liver cancer Mother   . Rheum arthritis Mother   . Colon polyps Neg Hx   . Kidney disease Neg Hx   . Esophageal cancer Neg Hx   . Gallbladder disease Neg Hx     Social History:  reports that he quit smoking about 36 years ago. His smoking use included cigarettes. He has a 10.00 pack-year smoking history. He has never used smokeless tobacco. He reports that he does not drink alcohol or use drugs.  ROS: Denies chest pain, cough, shortness of breath, fever, chills.  Otherwise noncontributory  Physical Exam: BP 126/62 (BP Location: Left Arm, Patient Position: Sitting, Cuff Size: Normal)   Pulse 60   Ht 5' 9.5" (1.765 m)   Wt 177 lb 1.6 oz (80.3 kg)   BMI 25.78 kg/m   Constitutional:  Alert and oriented, No acute distress. HEENT: [place] AT, moist mucus membranes.  Trachea midline, no masses. Cardiovascular: No clubbing, cyanosis, or edema.  RRR Respiratory: Normal respiratory effort, no increased work of breathing.  Clear GI: Abdomen is soft, nontender, nondistended, no abdominal masses Neurologic: Grossly intact, no focal deficits, moving all 4 extremities. Psychiatric: Normal mood and affect.   Assessment & Plan:    - History high-grade urothelial carcinoma the bladder with abnormal cystoscopy  Scheduled for cystoscopy with TURBT.  The procedure was discussed in detail.   The indications and nature of the planned procedure were discussed as well as the potential benefits and expected outcome.  Alternatives have been discussed.  Potential complications were discussed including but not limited to bleeding, infection and bladder perforation. The postoperative care and followup was reviewed.  The patient was informed that he may need additional treatment along with continued surveillance cystoscopy.  All of  his questions were answered and he desires to proceed.   Abbie Sons, [place] [address] [place], [place] [address] [phone]

## 2018-10-26 NOTE — H&P (View-Only) (Signed)
10/22/2018 11:17 AM   Aaron Quinn February 27, 1932 [id number]  Referring provider: Leone Haven, MD [address] [place],  [place] [address]  Chief Complaint  Patient presents with  . Cysto    Urologic history: -HG Ta urothelial carcinoma November 2018 -Induction BCG completed February 7439   HPI: 85 year old male with history of high-grade urothelial carcinoma bladder who on surveillance cystoscopy today was noted to have intense posterior wall mucosal erythema with edema.  Cystoscopy with TURBT was recommended.   PMH: Past Medical History:  Diagnosis Date  . Arthritis   . Cancer ([employer]) 2018   bladder cancer  . Complication of anesthesia    slow to get rid of anesthesia, over a couple of days.  . Diverticulosis   . Fibromyalgia 1995  . Glaucoma    pt denies  . History of bladder cancer 2018  . History of kidney stones   . Hyperlipidemia   . Hyperparathyroidism ([place]) 10/2017  . Hypertension   . Positive TB test   . Tubulovillous adenoma of colon     Surgical History: Past Surgical History:  Procedure Laterality Date  . APPENDECTOMY  2005  . CATARACT EXTRACTION, BILATERAL  2012  . COLONOSCOPY W/ BIOPSIES    . CYST EXCISION Right 1950's   thigh  . HEMORRHOID BANDING    . KNEE ARTHROSCOPY Left 2014  . KNEE CARTILAGE SURGERY Left 1951  . NASAL SINUS SURGERY  1950's  . TONSILLECTOMY  1940  . TOTAL HIP ARTHROPLASTY Right 11/13/2017   Procedure: TOTAL HIP ARTHROPLASTY;  Surgeon: Corky Mull, MD;  Location: [facility] ORS;  Service: Orthopedics;  Laterality: Right;  . TRANSURETHRAL RESECTION OF BLADDER TUMOR N/A 02/13/2017   Procedure: TRANSURETHRAL RESECTION OF BLADDER TUMOR (TURBT);  Surgeon: Abbie Sons, MD;  Location: [facility] ORS;  Service: Urology;  Laterality: N/A;  45 minutes needed  . VASECTOMY  1972    Home Medications:  Allergies as of 10/22/2018   No Known Allergies     Medication List       Accurate as of October 22, 2018 11:59 PM.  If you have any questions, ask your nurse or doctor.        STOP taking these medications   calcium carbonate 750 MG chewable tablet Commonly known as: TUMS EX Stopped by: Abbie Sons, MD   Vitamin D (Ergocalciferol) 1.25 MG (50000 UT) Caps capsule Commonly known as: DRISDOL Stopped by: Abbie Sons, MD     TAKE these medications   alendronate 70 MG tablet Commonly known as: FOSAMAX One tab every Sunday on empty stomach with a full glass of water. Do not lie down or eat/drink anything else for the next 30 min.   amLODipine 5 MG tablet Commonly known as: NORVASC TAKE 1 TABLET DAILY   Caltrate 600+D3 600-800 MG-UNIT Tabs Generic drug: Calcium Carb-Cholecalciferol Take by mouth 2 (two) times a day.   citalopram 20 MG tablet Commonly known as: CELEXA Take 20 mg by mouth daily.   donepezil 10 MG tablet Commonly known as: ARICEPT Take 10 mg by mouth every evening.   Flonase 50 MCG/ACT nasal spray Generic drug: fluticasone Place 1 spray into both nostrils daily as needed for allergies.   gabapentin 300 MG capsule Commonly known as: NEURONTIN Take 300 mg by mouth 4 (four) times daily.   ketoconazole 2 % shampoo Commonly known as: NIZORAL Apply 1 application topically once a week.   loratadine 10 MG tablet Commonly known as: CLARITIN  Take 10 mg by mouth daily.   Lyrica 25 MG capsule Generic drug: pregabalin Take 25 mg by mouth 3 (three) times daily.   tamsulosin 0.4 MG Caps capsule Commonly known as: FLOMAX Take by mouth.   triamcinolone cream 0.1 % Commonly known as: KENALOG Apply 1 application topically 2 (two) times daily.   Vitamin D3 50 MCG (2000 UT) capsule Take by mouth.   zolpidem 10 MG tablet Commonly known as: AMBIEN TAKE 1 TABLET (10 MG TOTAL) BY MOUTH AT BEDTIME AS NEEDED FOR SLEEP.       Allergies: No Known Allergies  Family History: Family History  Problem Relation Age of Onset  . Colon cancer Maternal Grandmother 42        Died at 104  . Heart disease Father   . Arthritis Other        Parent  . Hypertension Other        Parent, grandparent  . Diabetes Son   . Alcohol abuse Son   . Depression Son   . Liver cancer Mother   . Rheum arthritis Mother   . Colon polyps Neg Hx   . Kidney disease Neg Hx   . Esophageal cancer Neg Hx   . Gallbladder disease Neg Hx     Social History:  reports that he quit smoking about 36 years ago. His smoking use included cigarettes. He has a 10.00 pack-year smoking history. He has never used smokeless tobacco. He reports that he does not drink alcohol or use drugs.  ROS: Denies chest pain, cough, shortness of breath, fever, chills.  Otherwise noncontributory  Physical Exam: BP 126/62 (BP Location: Left Arm, Patient Position: Sitting, Cuff Size: Normal)   Pulse 60   Ht 5' 9.5" (1.765 m)   Wt 177 lb 1.6 oz (80.3 kg)   BMI 25.78 kg/m   Constitutional:  Alert and oriented, No acute distress. HEENT: [place] AT, moist mucus membranes.  Trachea midline, no masses. Cardiovascular: No clubbing, cyanosis, or edema.  RRR Respiratory: Normal respiratory effort, no increased work of breathing.  Clear GI: Abdomen is soft, nontender, nondistended, no abdominal masses Neurologic: Grossly intact, no focal deficits, moving all 4 extremities. Psychiatric: Normal mood and affect.   Assessment & Plan:    - History high-grade urothelial carcinoma the bladder with abnormal cystoscopy  Scheduled for cystoscopy with TURBT.  The procedure was discussed in detail.   The indications and nature of the planned procedure were discussed as well as the potential benefits and expected outcome.  Alternatives have been discussed.  Potential complications were discussed including but not limited to bleeding, infection and bladder perforation. The postoperative care and followup was reviewed.  The patient was informed that he may need additional treatment along with continued surveillance cystoscopy.  All of  his questions were answered and he desires to proceed.   Abbie Sons, [place] [address], [place], [place] [address] [phone]

## 2018-10-28 ENCOUNTER — Other Ambulatory Visit: Payer: Self-pay | Admitting: Family Medicine

## 2018-10-28 DIAGNOSIS — C679 Malignant neoplasm of bladder, unspecified: Secondary | ICD-10-CM

## 2018-10-29 ENCOUNTER — Other Ambulatory Visit: Payer: Self-pay

## 2018-10-29 ENCOUNTER — Other Ambulatory Visit: Payer: Self-pay | Admitting: Radiology

## 2018-10-29 ENCOUNTER — Other Ambulatory Visit: Payer: Medicare Other

## 2018-10-29 DIAGNOSIS — C679 Malignant neoplasm of bladder, unspecified: Secondary | ICD-10-CM

## 2018-10-29 DIAGNOSIS — Z8551 Personal history of malignant neoplasm of bladder: Secondary | ICD-10-CM

## 2018-10-29 DIAGNOSIS — R399 Unspecified symptoms and signs involving the genitourinary system: Secondary | ICD-10-CM

## 2018-10-29 LAB — URINALYSIS, COMPLETE
Bilirubin, UA: NEGATIVE
Glucose, UA: NEGATIVE
Ketones, UA: NEGATIVE
Nitrite, UA: NEGATIVE
Protein,UA: NEGATIVE
Specific Gravity, UA: 1.02 (ref 1.005–1.030)
Urobilinogen, Ur: 0.2 mg/dL (ref 0.2–1.0)
pH, UA: 6.5 (ref 5.0–7.5)

## 2018-10-29 LAB — MICROSCOPIC EXAMINATION: Bacteria, UA: NONE SEEN

## 2018-10-31 ENCOUNTER — Other Ambulatory Visit: Payer: Self-pay | Admitting: Urology

## 2018-11-01 ENCOUNTER — Other Ambulatory Visit: Payer: Self-pay

## 2018-11-01 ENCOUNTER — Encounter
Admission: RE | Admit: 2018-11-01 | Discharge: 2018-11-01 | Disposition: A | Discharge status: Home / Self Care | Payer: Medicare Other | Source: Ambulatory Visit | Attending: Urology | Admitting: Urology

## 2018-11-01 DIAGNOSIS — Z20828 Contact with and (suspected) exposure to other viral communicable diseases: Secondary | ICD-10-CM | POA: Insufficient documentation

## 2018-11-01 DIAGNOSIS — Z01818 Encounter for other preprocedural examination: Secondary | ICD-10-CM | POA: Diagnosis present

## 2018-11-01 LAB — CBC
HCT: 43.4 % (ref 39.0–52.0)
Hemoglobin: 14.3 g/dL (ref 13.0–17.0)
MCH: 30.1 pg (ref 26.0–34.0)
MCHC: 32.9 g/dL (ref 30.0–36.0)
MCV: 91.4 fL (ref 80.0–100.0)
Platelets: 194 10*3/uL (ref 150–400)
RBC: 4.75 MIL/uL (ref 4.22–5.81)
RDW: 12.5 % (ref 11.5–15.5)
WBC: 6.1 10*3/uL (ref 4.0–10.5)
nRBC: 0 % (ref 0.0–0.2)

## 2018-11-01 LAB — CULTURE, URINE COMPREHENSIVE

## 2018-11-01 NOTE — Patient Instructions (Signed)
Your procedure is scheduled on: Wednesday 11/06/18.  Report to DAY SURGERY DEPARTMENT LOCATED ON 2ND FLOOR MEDICAL MALL ENTRANCE. To find out your arrival time please call [phone] between 1PM - 3PM on Tuesday 11/05/18.   Remember: Instructions that are not followed completely may result in serious medical risk, up to and including death, or upon the discretion of your surgeon and anesthesiologist your surgery may need to be rescheduled.      _X__ 1. Do not eat food after midnight the night before your procedure.                 No gum chewing or hard candies. You may drink clear liquids up to 2 hours                 before you are scheduled to arrive for your surgery- DO NOT drink clear                 liquids within 2 hours of the start of your surgery.                 Clear Liquids include:  water, apple juice without pulp, clear carbohydrate                 drink such as Clearfast or Gatorade, Black Coffee or Tea (Do not add                 milk or cream to coffee or tea).   __X__2.  On the morning of surgery brush your teeth with toothpaste and water, you may rinse your mouth with mouthwash if you wish.  Do not swallow any toothpaste or mouthwash.       __X__3.  Notify your doctor if there is any change in your medical condition      (cold, fever, infections).       Do not wear jewelry, make-up, hairpins, clips or nail polish. Do not wear lotions, powders, or perfumes.  Do not shave 48 hours prior to surgery. Men may shave face and neck. Do not bring valuables to the hospital.    [facility] is not responsible for any belongings or valuables.  Contacts, dentures/partials or body piercings may not be worn into surgery. Bring a case for your contacts, glasses or hearing aids, a denture cup will be supplied.    Patients discharged the day of surgery will not be allowed to drive home.     __X__ Take these medicines the morning of surgery with A SIP OF WATER:     1.  amLODipine (NORVASC) 5 MG tablet  2. citalopram (CELEXA) 20 MG tablet  3. gabapentin (NEURONTIN) 300 MG capsule  4. loratadine (CLARITIN) 10 MG tablet  5. LYRICA 25 MG capsule  6. fluticasone (FLONASE) 50 MCG/ACT nasal spray (if needed)  __X__ Stop Anti-inflammatories 7 days before surgery such as Advil, Ibuprofen, Motrin, BC or Goodies Powder, Naprosyn, Naproxen, Aleve, Aspirin, Meloxicam. May take Tylenol if needed for pain or discomfort.    __X__ Do not begin taking any new herbal supplements prior to your procedure.

## 2018-11-02 LAB — SARS CORONAVIRUS 2 (TAT 6-24 HRS): SARS Coronavirus 2: NEGATIVE

## 2018-11-06 ENCOUNTER — Encounter
Admission: RE | Disposition: A | Discharge status: Home / Self Care | Payer: Self-pay | Source: Home / Self Care | Attending: Urology

## 2018-11-06 ENCOUNTER — Encounter: Payer: Self-pay | Admitting: *Deleted

## 2018-11-06 ENCOUNTER — Ambulatory Visit: Payer: Medicare Other | Admitting: Certified Registered"

## 2018-11-06 ENCOUNTER — Ambulatory Visit
Admission: RE | Admit: 2018-11-06 | Discharge: 2018-11-06 | Disposition: A | Discharge status: Home / Self Care | Payer: Medicare Other | Attending: Urology | Admitting: Urology

## 2018-11-06 ENCOUNTER — Other Ambulatory Visit: Payer: Self-pay

## 2018-11-06 DIAGNOSIS — M797 Fibromyalgia: Secondary | ICD-10-CM | POA: Insufficient documentation

## 2018-11-06 DIAGNOSIS — I10 Essential (primary) hypertension: Secondary | ICD-10-CM | POA: Insufficient documentation

## 2018-11-06 DIAGNOSIS — N3080 Other cystitis without hematuria: Secondary | ICD-10-CM | POA: Diagnosis not present

## 2018-11-06 DIAGNOSIS — R399 Unspecified symptoms and signs involving the genitourinary system: Secondary | ICD-10-CM

## 2018-11-06 DIAGNOSIS — Z7983 Long term (current) use of bisphosphonates: Secondary | ICD-10-CM | POA: Diagnosis not present

## 2018-11-06 DIAGNOSIS — Z96641 Presence of right artificial hip joint: Secondary | ICD-10-CM | POA: Diagnosis not present

## 2018-11-06 DIAGNOSIS — D494 Neoplasm of unspecified behavior of bladder: Secondary | ICD-10-CM

## 2018-11-06 DIAGNOSIS — Z79899 Other long term (current) drug therapy: Secondary | ICD-10-CM | POA: Insufficient documentation

## 2018-11-06 DIAGNOSIS — Z87891 Personal history of nicotine dependence: Secondary | ICD-10-CM | POA: Insufficient documentation

## 2018-11-06 DIAGNOSIS — Z8551 Personal history of malignant neoplasm of bladder: Secondary | ICD-10-CM | POA: Diagnosis not present

## 2018-11-06 HISTORY — PX: TRANSURETHRAL RESECTION OF BLADDER TUMOR: SHX2575

## 2018-11-06 SURGERY — TURBT (TRANSURETHRAL RESECTION OF BLADDER TUMOR)
Anesthesia: General

## 2018-11-06 MED ORDER — EPHEDRINE SULFATE 50 MG/ML IJ SOLN
INTRAMUSCULAR | Status: AC
  Filled 2018-11-06: qty 1

## 2018-11-06 MED ORDER — CEFAZOLIN SODIUM-DEXTROSE 2-4 GM/100ML-% IV SOLN
2.0000 g | INTRAVENOUS | Status: AC
  Administered 2018-11-06: 2 g via INTRAVENOUS

## 2018-11-06 MED ORDER — FENTANYL CITRATE (PF) 100 MCG/2ML IJ SOLN
INTRAMUSCULAR | Status: AC
  Filled 2018-11-06: qty 2

## 2018-11-06 MED ORDER — LIDOCAINE HCL (CARDIAC) PF 100 MG/5ML IV SOSY
PREFILLED_SYRINGE | INTRAVENOUS | Status: DC | PRN
  Administered 2018-11-06: 100 mg via INTRAVENOUS

## 2018-11-06 MED ORDER — PROPOFOL 10 MG/ML IV BOLUS
INTRAVENOUS | Status: AC
  Filled 2018-11-06: qty 20

## 2018-11-06 MED ORDER — DEXAMETHASONE SODIUM PHOSPHATE 10 MG/ML IJ SOLN
INTRAMUSCULAR | Status: AC
  Filled 2018-11-06: qty 1

## 2018-11-06 MED ORDER — CEFAZOLIN SODIUM-DEXTROSE 2-4 GM/100ML-% IV SOLN
INTRAVENOUS | Status: AC
  Filled 2018-11-06: qty 100

## 2018-11-06 MED ORDER — FENTANYL CITRATE (PF) 100 MCG/2ML IJ SOLN
INTRAMUSCULAR | Status: DC | PRN
  Administered 2018-11-06 (×2): 25 ug via INTRAVENOUS

## 2018-11-06 MED ORDER — FAMOTIDINE 20 MG PO TABS
20.0000 mg | ORAL_TABLET | Freq: Once | ORAL | Status: AC
  Administered 2018-11-06: 09:00:00 20 mg via ORAL

## 2018-11-06 MED ORDER — ONDANSETRON HCL 4 MG/2ML IJ SOLN
INTRAMUSCULAR | Status: AC
  Filled 2018-11-06: qty 2

## 2018-11-06 MED ORDER — LIDOCAINE HCL (PF) 2 % IJ SOLN
INTRAMUSCULAR | Status: AC
  Filled 2018-11-06: qty 10

## 2018-11-06 MED ORDER — LACTATED RINGERS IV SOLN
INTRAVENOUS | Status: DC
  Administered 2018-11-06: 09:00:00 via INTRAVENOUS

## 2018-11-06 MED ORDER — DEXAMETHASONE SODIUM PHOSPHATE 10 MG/ML IJ SOLN
INTRAMUSCULAR | Status: DC | PRN
  Administered 2018-11-06: 10 mg via INTRAVENOUS

## 2018-11-06 MED ORDER — GLYCOPYRROLATE 0.2 MG/ML IJ SOLN
INTRAMUSCULAR | Status: DC | PRN
  Administered 2018-11-06: 0.2 mg via INTRAVENOUS

## 2018-11-06 MED ORDER — PHENYLEPHRINE HCL (PRESSORS) 10 MG/ML IV SOLN
INTRAVENOUS | Status: AC
  Filled 2018-11-06: qty 1

## 2018-11-06 MED ORDER — ONDANSETRON HCL 4 MG/2ML IJ SOLN
INTRAMUSCULAR | Status: DC | PRN
  Administered 2018-11-06: 4 mg via INTRAVENOUS

## 2018-11-06 MED ORDER — ONDANSETRON HCL 4 MG/2ML IJ SOLN: 4.0000 mg | Freq: Once | INTRAMUSCULAR | Status: DC | PRN

## 2018-11-06 MED ORDER — FENTANYL CITRATE (PF) 100 MCG/2ML IJ SOLN: 25.0000 ug | INTRAMUSCULAR | Status: DC | PRN

## 2018-11-06 MED ORDER — GLYCOPYRROLATE 0.2 MG/ML IJ SOLN
INTRAMUSCULAR | Status: AC
  Filled 2018-11-06: qty 1

## 2018-11-06 MED ORDER — PROPOFOL 10 MG/ML IV BOLUS
INTRAVENOUS | Status: DC | PRN
  Administered 2018-11-06: 150 mg via INTRAVENOUS

## 2018-11-06 MED ORDER — EPHEDRINE SULFATE 50 MG/ML IJ SOLN
INTRAMUSCULAR | Status: DC | PRN
  Administered 2018-11-06: 10 mg via INTRAVENOUS
  Administered 2018-11-06: 5 mg via INTRAVENOUS

## 2018-11-06 MED ORDER — PHENYLEPHRINE HCL (PRESSORS) 10 MG/ML IV SOLN
INTRAVENOUS | Status: DC | PRN
  Administered 2018-11-06: 100 ug via INTRAVENOUS
  Administered 2018-11-06: 50 ug via INTRAVENOUS
  Administered 2018-11-06: 100 ug via INTRAVENOUS

## 2018-11-06 MED ORDER — FAMOTIDINE 20 MG PO TABS
ORAL_TABLET | ORAL | Status: AC
  Administered 2018-11-06: 09:00:00 20 mg via ORAL
  Filled 2018-11-06: qty 1

## 2018-11-06 SURGICAL SUPPLY — 26 items
BAG DRAIN CYSTO-URO LG1000N (MISCELLANEOUS) ×2 IMPLANT
BAG URINE DRAINAGE (UROLOGICAL SUPPLIES) ×2 IMPLANT
CATH FOL 2WAY LX 16X30 (CATHETERS) ×1 IMPLANT
CATH FOLEY 2WAY  5CC 16FR (CATHETERS)
CATH FOLEY 2WAY 5CC 16FR (CATHETERS)
CATH URTH 16FR FL 2W BLN LF (CATHETERS) IMPLANT
DRAPE UTILITY 15X26 TOWEL STRL (DRAPES) ×2 IMPLANT
DRSG TELFA 4X3 1S NADH ST (GAUZE/BANDAGES/DRESSINGS) ×2 IMPLANT
ELECT COAG BIPOLAR CYL 1.2MMM (ELECTROSURGICAL) ×2
ELECT LOOP 22F BIPOLAR SML (ELECTROSURGICAL)
ELECT REM PT RETURN 9FT ADLT (ELECTROSURGICAL)
ELECTRODE COAG BIPLR CYL 1.2MM (ELECTROSURGICAL) IMPLANT
ELECTRODE LOOP 22F BIPOLAR SML (ELECTROSURGICAL) IMPLANT
ELECTRODE REM PT RTRN 9FT ADLT (ELECTROSURGICAL) IMPLANT
GLOVE BIO SURGEON STRL SZ8 (GLOVE) ×2 IMPLANT
GOWN STRL REUS W/ TWL LRG LVL3 (GOWN DISPOSABLE) ×1 IMPLANT
GOWN STRL REUS W/TWL LRG LVL3 (GOWN DISPOSABLE) ×2
GOWN STRL REUS W/TWL XL LVL4 (GOWN DISPOSABLE) ×2 IMPLANT
HOLDER FOLEY CATH W/STRAP (MISCELLANEOUS) ×1 IMPLANT
KIT TURNOVER CYSTO (KITS) ×2 IMPLANT
LOOP CUT BIPOLAR 24F LRG (ELECTROSURGICAL) ×1 IMPLANT
PACK CYSTO AR (MISCELLANEOUS) ×2 IMPLANT
SET IRRIG Y TYPE TUR BLADDER L (SET/KITS/TRAYS/PACK) ×2 IMPLANT
SURGILUBE 2OZ TUBE FLIPTOP (MISCELLANEOUS) ×2 IMPLANT
SYRINGE IRR TOOMEY STRL 70CC (SYRINGE) ×2 IMPLANT
WATER STERILE IRR 1000ML POUR (IV SOLUTION) ×2 IMPLANT

## 2018-11-06 NOTE — Anesthesia Postprocedure Evaluation (Signed)
Anesthesia Post Note  Patient: Aaron Quinn  Procedure(s) Performed: TRANSURETHRAL RESECTION OF BLADDER TUMOR (TURBT) (N/A )  Patient location during evaluation: PACU Anesthesia Type: General Level of consciousness: awake and alert Pain management: pain level controlled Vital Signs Assessment: post-procedure vital signs reviewed and stable Respiratory status: spontaneous breathing, nonlabored ventilation, respiratory function stable and patient connected to nasal cannula oxygen Cardiovascular status: blood pressure returned to baseline and stable Postop Assessment: no apparent nausea or vomiting Anesthetic complications: no     Last Vitals:  Vitals:   11/06/18 1232 11/06/18 1311  BP: (!) 142/92 (!) 143/84  Pulse: 67 72  Resp: 18 19  Temp: (!) 36.1 C (!) 36.1 C  SpO2: 96% 97%    Last Pain:  Vitals:   11/06/18 1311  TempSrc: Temporal  PainSc:                  Martha Clan

## 2018-11-06 NOTE — Anesthesia Preprocedure Evaluation (Signed)
Anesthesia Evaluation  Patient identified by MRN, date of birth, ID band Patient awake    Reviewed: Allergy & Precautions, H&P , NPO status , Patient's Chart, lab work & pertinent test results, reviewed documented beta blocker date and time   History of Anesthesia Complications (+) history of anesthetic complications  Airway Mallampati: II  TM Distance: >3 FB Neck ROM: full    Dental  (+) Teeth Intact   Pulmonary neg pulmonary ROS, former smoker,    Pulmonary exam normal        Cardiovascular Exercise Tolerance: Good hypertension, On Medications negative cardio ROS Normal cardiovascular exam Rate:Normal     Neuro/Psych  Neuromuscular disease negative psych ROS   GI/Hepatic negative GI ROS, Neg liver ROS,   Endo/Other  negative endocrine ROS  Renal/GU negative Renal ROS  negative genitourinary   Musculoskeletal   Abdominal   Peds  Hematology negative hematology ROS (+)   Anesthesia Other Findings   Reproductive/Obstetrics negative OB ROS                             Anesthesia Physical Anesthesia Plan  ASA: III  Anesthesia Plan: General LMA   Post-op Pain Management:    Induction:   PONV Risk Score and Plan:   Airway Management Planned:   Additional Equipment:   Intra-op Plan:   Post-operative Plan:   Informed Consent: I have reviewed the patients History and Physical, chart, labs and discussed the procedure including the risks, benefits and alternatives for the proposed anesthesia with the patient or authorized representative who has indicated his/her understanding and acceptance.       Plan Discussed with: CRNA  Anesthesia Plan Comments:         Anesthesia Quick Evaluation

## 2018-11-06 NOTE — Discharge Instructions (Signed)
AMBULATORY SURGERY  DISCHARGE INSTRUCTIONS   1) The drugs that you were given will stay in your system until tomorrow so for the next 24 hours you should not:  A) Drive an automobile B) Make any legal decisions C) Drink any alcoholic beverage   2) You may resume regular meals tomorrow.  Today it is better to start with liquids and gradually work up to solid foods.  You may eat anything you prefer, but it is better to start with liquids, then soup and crackers, and gradually work up to solid foods.   3) Please notify your doctor immediately if you have any unusual bleeding, trouble breathing, redness and pain at the surgery site, drainage, fever, or pain not relieved by medication.    4) Additional Instructions:   Transurethral Resection of Bladder Tumor (TURBT) or Bladder Biopsy   Definition:  Transurethral Resection of the Bladder Tumor is a surgical procedure used to diagnose and remove tumors within the bladder. T  General instructions:     Your recent bladder surgery requires very little post hospital care but some definite precautions.  Despite the fact that no skin incisions were used, the area around the bladder incisions are raw and covered with scabs to promote healing and prevent bleeding. Certain precautions are needed to insure that the scabs are not disturbed over the next 2-4 weeks while the healing proceeds.  Because the raw surface inside your bladder and the irritating effects of urine you may expect frequency of urination and/or urgency (a stronger desire to urinate) and perhaps even getting up at night more often. This will usually resolve or improve slowly over the healing period. You may see some blood in your urine over the first 6 weeks. Do not be alarmed, even if the urine was clear for a while. Get off your feet and drink lots of fluids until clearing occurs. If you start to pass clots or don't improve call us.  Diet:  You may return to your normal diet  immediately. Because of the raw surface of your bladder, alcohol, spicy foods, foods high in acid and drinks with caffeine may cause irritation or frequency and should be used in moderation. To keep your urine flowing freely and avoid constipation, drink plenty of fluids during the day (8-10 glasses). Tip: Avoid cranberry juice because it is very acidic.  Activity:  Your physical activity doesn't need to be restricted. However, if you are very active, you may see some blood in the urine. We suggest that you reduce your activity under the circumstances until the bleeding has stopped.  Bowels:  It is important to keep your bowels regular during the postoperative period. Straining with bowel movements can cause bleeding. A bowel movement every other day is reasonable. Use a mild laxative if needed, such as milk of magnesia 2-3 tablespoons, or 2 Dulcolax tablets. Call if you continue to have problems. If you had been taking narcotics for pain, before, during or after your surgery, you may be constipated. Take a laxative if necessary.    Medication:  You should resume your pre-surgery medications unless told not to. In addition you may be given an antibiotic to prevent or treat infection. Antibiotics are not always necessary. All medication should be taken as prescribed until the bottles are finished unless you are having an unusual reaction to one of the drugs.   [facility] [address] [place], [place] [address] [phone]        Please  contact your physician with any problems or Same Day Surgery at [phone], Monday through Friday 6 am to 4 pm, or [place] at [facility] number at [phone].

## 2018-11-06 NOTE — Op Note (Signed)
Preoperative diagnosis: 1. Bladder tumor (3 cm)  Postoperative diagnosis:  1. Bladder tumor 3 cm)  Procedure:  1. Cystoscopy 2. Transurethral resection of bladder tumor (medium)   Surgeon: Nicki Reaper C. Deshonda Cryderman, M.D.  Anesthesia: General  Complications: None  Intraoperative findings:  1. Bladder tumor: Erythematous edematous bladder mucosal left posterior wall   EBL: Minimal  Specimens: 1. Bladder tumor   Indication: Aaron Quinn is an 85 year old male with history of high-grade Ta urothelial carcinoma the bladder status post TURBT 01/2017.  He completed his 6-week induction BCG.  Follow-up surveillance cystoscopies have been negative until earlier this month when cystoscopy showed intense posterior wall mucosal erythema with edema.  Urine cytology was negative.     After reviewing the management options for treatment, he elected to proceed with the above surgical procedure(s). We have discussed the potential benefits and risks of the procedure, side effects of the proposed treatment, the likelihood of the patient achieving the goals of the procedure, and any potential problems that might occur during the procedure or recuperation. Informed consent has been obtained.  Description of procedure:  The patient was taken to the operating room and general anesthesia was induced.  The patient was placed in the dorsal lithotomy position, prepped and draped in the usual sterile fashion, and preoperative antibiotics were administered. A preoperative time-out was performed.   A 26 French continuous-flow resectoscope sheath with visual obturator was lubricated and passed per urethra.  The urethra was normal in caliber without stricture.  The prostate demonstrated moderate lateral lobe enlargement with mild bladder neck elevation.  The bladder mucosa was inspected and there was a >3 cm area on the left posterior wall with mucosal edema and intense erythema.  No other mucosal abnormalities  were noted.  The ureter orifices were normal appearing bilaterally.  The [facility] resectoscope with loop was then placed into the resectoscope sheath and the abnormal area was resected down to superficial muscle.  Once resection was complete the site and surrounding mucosa was fulgurated with a rollerball electrode.  The resected tumor was then evacuated by fill/drain.  At the completion procedure hemostasis was noted to be adequate.  The resectoscope was removed and a 38 French Foley catheter was placed with return of light pink effluent on irrigation.  After anesthetic reversal the patient was transported to the PACU in stable condition.   Awab Abebe C. Bernardo Heater,  MD

## 2018-11-06 NOTE — Transfer of Care (Signed)
Immediate Anesthesia Transfer of Care Note  Patient: JHAIR WITHERINGTON  Procedure(s) Performed: Procedure(s): TRANSURETHRAL RESECTION OF BLADDER TUMOR (TURBT) (N/A)  Patient Location: PACU  Anesthesia Type:General  Level of Consciousness: sedated  Airway & Oxygen Therapy: Patient Spontanous Breathing and Patient connected to face mask oxygen  Post-op Assessment: Report given to RN and Post -op Vital signs reviewed and stable  Post vital signs: Reviewed and stable  Last Vitals:  Vitals:   11/06/18 0823 11/06/18 1138  BP: (!) 154/81 138/77  Pulse: 65 73  Resp: 16 12  Temp: 36.8 C 36.4 C  SpO2: 92% 76%    Complications: No apparent anesthesia complications

## 2018-11-06 NOTE — Anesthesia Post-op Follow-up Note (Signed)
Anesthesia QCDR form completed.        

## 2018-11-06 NOTE — Anesthesia Procedure Notes (Signed)
Procedure Name: LMA Insertion Date/Time: 11/06/2018 10:51 AM Performed by: Lavone Orn, CRNA Pre-anesthesia Checklist: Patient identified, Emergency Drugs available, Suction available, Patient being monitored and Timeout performed Patient Re-evaluated:Patient Re-evaluated prior to induction Oxygen Delivery Method: Circle system utilized Preoxygenation: Pre-oxygenation with 100% oxygen Induction Type: IV induction LMA Size: 5.0 Number of attempts: 1 Placement Confirmation: positive ETCO2 and breath sounds checked- equal and bilateral Tube secured with: Tape Dental Injury: Teeth and Oropharynx as per pre-operative assessment

## 2018-11-06 NOTE — Interval H&P Note (Signed)
History and Physical Interval Note:  11/06/2018 10:22 AM  Aaron Quinn  has presented today for surgery, with the diagnosis of abnormal cysto, without bladder cancer.  The various methods of treatment have been discussed with the patient and family. After consideration of risks, benefits and other options for treatment, the patient has consented to  Procedure(s): TRANSURETHRAL RESECTION OF BLADDER TUMOR (TURBT) (N/A) as a surgical intervention.  The patient's history has been reviewed, patient examined, no change in status, stable for surgery.  I have reviewed the patient's chart and labs.  Questions were answered to the patient's satisfaction.     [place]

## 2018-11-07 ENCOUNTER — Ambulatory Visit (INDEPENDENT_AMBULATORY_CARE_PROVIDER_SITE_OTHER): Payer: Medicare Other

## 2018-11-07 ENCOUNTER — Other Ambulatory Visit: Payer: Self-pay

## 2018-11-07 DIAGNOSIS — N329 Bladder disorder, unspecified: Secondary | ICD-10-CM

## 2018-11-07 NOTE — Progress Notes (Signed)
Catheter Removal  Patient is present today for a catheter removal.  44ml of water was drained from the balloon. A 16FR foley cath was removed from the bladder no complications were noted . Patient tolerated well.  Performed by: Gordy Clement, CMA   Follow up/ Additional notes: RTC as scheduled for post op

## 2018-11-08 LAB — SURGICAL PATHOLOGY

## 2018-11-10 ENCOUNTER — Encounter: Payer: Self-pay | Admitting: Urology

## 2018-11-12 NOTE — Telephone Encounter (Signed)
-----   Message from Abbie Sons, MD sent at 11/10/2018  9:59 AM EDT ----- Notified patient of negative pathology results via [facility].  Needs an office cystoscopy November 2020

## 2018-11-12 NOTE — Telephone Encounter (Signed)
App made and patient has been notified via my chart   Sharyn Lull

## 2018-11-21 ENCOUNTER — Other Ambulatory Visit: Payer: Self-pay | Admitting: Family Medicine

## 2018-11-21 DIAGNOSIS — F5101 Primary insomnia: Secondary | ICD-10-CM

## 2018-11-22 NOTE — Telephone Encounter (Signed)
Last OV 5/20 last fill 08/24/18

## 2018-11-26 ENCOUNTER — Other Ambulatory Visit: Payer: Self-pay

## 2018-11-26 ENCOUNTER — Ambulatory Visit
Admission: RE | Admit: 2018-11-26 | Discharge: 2018-11-26 | Disposition: A | Discharge status: Home / Self Care | Payer: Medicare Other | Source: Ambulatory Visit | Attending: Family Medicine | Admitting: Family Medicine

## 2018-11-26 DIAGNOSIS — I712 Thoracic aortic aneurysm, without rupture, unspecified: Secondary | ICD-10-CM

## 2018-11-26 MED ORDER — IOHEXOL 350 MG/ML SOLN
75.0000 mL | Freq: Once | INTRAVENOUS | Status: AC | PRN
  Administered 2018-11-26: 11:00:00 75 mL via INTRAVENOUS

## 2018-12-23 ENCOUNTER — Other Ambulatory Visit: Payer: Self-pay

## 2018-12-25 ENCOUNTER — Ambulatory Visit (INDEPENDENT_AMBULATORY_CARE_PROVIDER_SITE_OTHER): Payer: Medicare Other | Admitting: Family Medicine

## 2018-12-25 ENCOUNTER — Other Ambulatory Visit: Payer: Self-pay

## 2018-12-25 ENCOUNTER — Encounter: Payer: Self-pay | Admitting: Family Medicine

## 2018-12-25 DIAGNOSIS — R202 Paresthesia of skin: Secondary | ICD-10-CM

## 2018-12-25 DIAGNOSIS — I10 Essential (primary) hypertension: Secondary | ICD-10-CM

## 2018-12-25 DIAGNOSIS — G629 Polyneuropathy, unspecified: Secondary | ICD-10-CM

## 2018-12-25 DIAGNOSIS — F5101 Primary insomnia: Secondary | ICD-10-CM

## 2018-12-25 LAB — VITAMIN B12: Vitamin B-12: 289 pg/mL (ref 211–911)

## 2018-12-25 NOTE — Assessment & Plan Note (Signed)
Chronic issue.  He follows with neurology.  He will continue gabapentin and Lyrica.  If this issue worsens we could consider increasing 1 of those doses.

## 2018-12-25 NOTE — Assessment & Plan Note (Signed)
Well-controlled.  Continue current regimen. 

## 2018-12-25 NOTE — Progress Notes (Signed)
[name], MD Phone: [phone]  Aaron Quinn is a 85 y.o. male who presents today for follow-up.  Neuropathy: This is a chronic issue for many years.  He notes it slightly worse in his feet currently.  He will feel it up into his lower calves at times.  Occasionally feels it in his hands.  He is on gabapentin and Lyrica for fibromyalgia.  He follows with neurology and notes they told him that tingling can occur as people age.  They did decrease his gabapentin and Lyrica dose in the last several months.  Insomnia: He continues on Ambien.  He has had issues with waking up at 5 AM and not being able to go back to sleep over the last month.  He gets up to go to the restroom at 5 AM and that has been a chronic ongoing occurrence though previously he was not having trouble falling back asleep.  He goes to bed at 10:30 PM and falls asleep easily.  He notes no anxiety or depression.  When he wakes up he does note his mind just keeps going.  He has started taking Prevagen for memory in the last 3 to 4 weeks.  Hypertension: Typically 130s-140s over 70s-80s.  Taking amlodipine.  No chest pain, shortness of breath, or edema.  Social History   Tobacco Use  Smoking Status Former Smoker  . Packs/day: 0.50  . Years: 20.00  . Pack years: 10.00  . Types: Cigarettes  . Quit date: 03/20/1982  . Years since quitting: 36.7  Smokeless Tobacco Never Used     ROS see history of present illness  Objective  Physical Exam Vitals:   12/25/18 1029 12/25/18 1059  BP: (!) 150/70 125/80  Pulse: 65   Temp: (!) 97.5 F (36.4 C)   SpO2: 97%     BP Readings from Last 3 Encounters:  12/25/18 125/80  11/06/18 (!) 143/84  11/01/18 (!) 145/75   Wt Readings from Last 3 Encounters:  12/25/18 178 lb 3.2 oz (80.8 kg)  11/06/18 176 lb 2.4 oz (79.9 kg)  11/01/18 176 lb 3.2 oz (79.9 kg)    Physical Exam Constitutional:      General: He is not in acute distress.    Appearance: He is not diaphoretic.   Cardiovascular:     Rate and Rhythm: Normal rate and regular rhythm.     Heart sounds: Normal heart sounds.  Pulmonary:     Effort: Pulmonary effort is normal.     Breath sounds: Normal breath sounds.  Musculoskeletal:     Right lower leg: No edema.     Left lower leg: No edema.  Skin:    General: Skin is warm and dry.  Neurological:     Mental Status: He is alert.     Comments: 5/5 strength in bilateral biceps, triceps, grip, quads, hamstrings, plantar and dorsiflexion, sensation to light touch intact in bilateral UE and LE, normal gait    Diabetic Foot Exam - Simple   Simple Foot Form Diabetic Foot exam was performed with the following findings: Yes 12/25/2018 11:01 AM  Visual Inspection No deformities, no ulcerations, no other skin breakdown bilaterally: Yes Sensation Testing See comments: Yes Pulse Check Posterior Tibialis and Dorsalis pulse intact bilaterally: Yes Comments Intact light touch sensation bilateral feet, decreased monofilament testing in his feet bilaterally      Assessment/Plan: Please see individual problem list.  Tingling in extremities Chronic issue.  He follows with neurology.  He will continue gabapentin and  Lyrica.  If this issue worsens we could consider increasing 1 of those doses.  Essential hypertension Well-controlled.  Continue current regimen.  Neuropathy (HCC) Chronic issue.  Mild slight worsening.  Plan to check B12.  He will continue gabapentin and Lyrica.  Insomnia Chronic issue.  Relatively stable though he is having trouble falling asleep again at 5 AM.  This could be related to some stress with everything going on in the world.  I will have him trial discontinuing the Prevagen to see if that makes a difference.  Continue Ambien. He will let his neurologist know that he has been taking prevagen.     Orders Placed This Encounter  Procedures  . B12    No orders of the defined types were placed in this encounter.    [name], MD [place]

## 2018-12-25 NOTE — Patient Instructions (Addendum)
Nice to see you. Please monitor the neuropathy.  If it worsens please let us or your neurologist know. Please try stopping the Prevagen to see if that helps resolve her sleep issues. Please continue to monitor your blood pressure.

## 2018-12-25 NOTE — Assessment & Plan Note (Addendum)
Chronic issue.  Relatively stable though he is having trouble falling asleep again at 5 AM.  This could be related to some stress with everything going on in the world.  I will have him trial discontinuing the Prevagen to see if that makes a difference.  Continue Ambien. He will let his neurologist know that he has been taking prevagen.

## 2018-12-25 NOTE — Assessment & Plan Note (Signed)
Chronic issue.  Mild slight worsening.  Plan to check B12.  He will continue gabapentin and Lyrica.

## 2019-02-03 ENCOUNTER — Other Ambulatory Visit: Payer: Medicare Other | Admitting: Urology

## 2019-02-10 ENCOUNTER — Ambulatory Visit (INDEPENDENT_AMBULATORY_CARE_PROVIDER_SITE_OTHER): Payer: Medicare Other | Admitting: Urology

## 2019-02-10 ENCOUNTER — Encounter: Payer: Self-pay | Admitting: Urology

## 2019-02-10 ENCOUNTER — Other Ambulatory Visit: Payer: Self-pay

## 2019-02-10 VITALS — BP 117/74 | HR 78 | Ht 68.0 in | Wt 178.0 lb

## 2019-02-10 DIAGNOSIS — Z8551 Personal history of malignant neoplasm of bladder: Secondary | ICD-10-CM

## 2019-02-10 DIAGNOSIS — R339 Retention of urine, unspecified: Secondary | ICD-10-CM | POA: Diagnosis not present

## 2019-02-10 MED ORDER — CIPROFLOXACIN HCL 500 MG PO TABS
500.0000 mg | ORAL_TABLET | Freq: Once | ORAL | Status: AC
  Administered 2019-02-10: 500 mg via ORAL

## 2019-02-10 NOTE — Addendum Note (Signed)
Addended by: Verlene Mayer A on: 02/10/2019 04:12 PM   Modules accepted: Orders

## 2019-02-10 NOTE — Progress Notes (Signed)
   02/10/19  CC:  Chief Complaint  Patient presents with  . Cysto    Urologic history: -HG Taurothelial carcinoma November 2018 -Induction BCG completed February 2019  HPI: Surveillance cystoscopy August 2020 remarkable for intense left posterior wall erythema with bullous edema.  Urine cytology was negative.  TURBT was performed August 2020 with pathology returning erythematous and inflamed urothelial mucosa with cystitis cystica, reactive lymphoid follicles.  Negative for atypia or malignancy.  He had no postoperative problems.  He presents today for surveillance cystoscopy  Blood pressure 117/74, pulse 78, height 5\' 8"  (1.727 m), weight 178 lb (80.7 kg). NED. A&Ox3.   No respiratory distress   Abd soft, NT, ND Normal phallus with bilateral descended testicles  Cystoscopy Procedure Note  Patient identification was confirmed, informed consent was obtained, and patient was prepped using Betadine solution.  Lidocaine jelly was administered per urethral meatus.     Pre-Procedure: - Inspection reveals a normal caliber ureteral meatus.  Procedure: The flexible cystoscope was introduced without difficulty - No urethral strictures/lesions are present. - Prominent lateral lobe enlargement prostate  - Mild elevation bladder neck - Bilateral ureteral orifices identified - Bladder mucosa  reveals no tumors.  Previously noted resection site with mild erythema. - No bladder stones - No trabeculation  Retroflexion shows no abnormalities   Post-Procedure: - Patient tolerated the procedure well  Assessment/ Plan: History high-grade urothelial carcinoma with recent TURBT negative for recurrent carcinoma.  Mild erythema in the same area which has improved.  He is 2 years out from his initial diagnosis and will move to every 6 month surveillance cystoscopy.   Abbie Sons, MD

## 2019-02-11 LAB — URINALYSIS, COMPLETE
Bilirubin, UA: NEGATIVE
Glucose, UA: NEGATIVE
Ketones, UA: NEGATIVE
Leukocytes,UA: NEGATIVE
Nitrite, UA: NEGATIVE
Protein,UA: NEGATIVE
Specific Gravity, UA: 1.025 (ref 1.005–1.030)
Urobilinogen, Ur: 0.2 mg/dL (ref 0.2–1.0)
pH, UA: 6 (ref 5.0–7.5)

## 2019-02-11 LAB — MICROSCOPIC EXAMINATION
Bacteria, UA: NONE SEEN
Epithelial Cells (non renal): NONE SEEN /hpf (ref 0–10)

## 2019-02-16 ENCOUNTER — Other Ambulatory Visit: Payer: Self-pay | Admitting: Family Medicine

## 2019-02-16 DIAGNOSIS — F5101 Primary insomnia: Secondary | ICD-10-CM

## 2019-02-17 NOTE — Telephone Encounter (Signed)
Refilled: 11/22/2018 Last OV: 08/16/2018 Next OV: 04/30/2019

## 2019-04-01 ENCOUNTER — Other Ambulatory Visit: Payer: Self-pay | Admitting: Podiatry

## 2019-04-01 ENCOUNTER — Ambulatory Visit (INDEPENDENT_AMBULATORY_CARE_PROVIDER_SITE_OTHER): Payer: Medicare Other

## 2019-04-01 ENCOUNTER — Ambulatory Visit (INDEPENDENT_AMBULATORY_CARE_PROVIDER_SITE_OTHER): Payer: Medicare Other | Admitting: Podiatry

## 2019-04-01 ENCOUNTER — Other Ambulatory Visit: Payer: Self-pay

## 2019-04-01 ENCOUNTER — Ambulatory Visit: Payer: Medicare Other

## 2019-04-01 DIAGNOSIS — M79672 Pain in left foot: Secondary | ICD-10-CM

## 2019-04-01 DIAGNOSIS — L853 Xerosis cutis: Secondary | ICD-10-CM | POA: Diagnosis not present

## 2019-04-01 DIAGNOSIS — M19072 Primary osteoarthritis, left ankle and foot: Secondary | ICD-10-CM

## 2019-04-01 DIAGNOSIS — M792 Neuralgia and neuritis, unspecified: Secondary | ICD-10-CM

## 2019-04-01 MED ORDER — CLOTRIMAZOLE-BETAMETHASONE 1-0.05 % EX CREA: 1.0000 "application " | TOPICAL_CREAM | Freq: Two times a day (BID) | CUTANEOUS | 0 refills | Status: DC

## 2019-04-02 ENCOUNTER — Encounter: Payer: Self-pay | Admitting: Podiatry

## 2019-04-02 NOTE — Progress Notes (Signed)
Subjective:  Patient ID: Aaron Quinn, male    DOB: 1931/10/12,  MRN: [id number]  Chief Complaint  Patient presents with  . Ankle Pain    pt is here for left ankle pain located on the lateral side of the left ankle, pt has possible swelling a s well, which has also been going on for a couple of years, pain will often vary, but states it is mostly a burning sensation    86 y.o. male presents with the above complaint.  Patient lateral ankle xerosis/itching associated with it.  Patient states that has been going on for 2 to 3 years has progressively gotten worse.  He states this pretty much the same between wintertime or summertime however during wintertime is more progressively worsened.  He states that there is chronic association with it.  The pain was also very.  He denies moisturizing his lower extremity.  He denies any other acute complaints at this time.  He denies any injury or trauma to the area.  Patient has a secondary complaint of peripheral neuropathy patient has been taking gabapentin which helps a little bit but not really.  He would like to know if there is any other medication or cream that can be taken for it.   Review of Systems: Negative except as noted in the HPI. Denies N/V/F/Ch.  Past Medical History:  Diagnosis Date  . Arthritis   . Bladder cancer ([place]) 2018   Hx TURB  . Complication of anesthesia    slow to get rid of anesthesia, over a couple of days.  . Diverticulosis   . Fibromyalgia 1995  . Glaucoma    pt denies  . History of bladder cancer 2018  . History of kidney stones   . Hyperlipidemia   . Hyperparathyroidism ([place]) 10/2017  . Hypertension   . Positive TB test   . Tubulovillous adenoma of colon     Current Outpatient Medications:  .  alendronate (FOSAMAX) 70 MG tablet, Take 70 mg by mouth every Sunday. , Disp: , Rfl:  .  amLODipine (NORVASC) 5 MG tablet, TAKE 1 TABLET DAILY (Patient taking differently: Take 5 mg by mouth daily. ), Disp: 90  tablet, Rfl: 3 .  APOAEQUORIN PO, Take by mouth., Disp: , Rfl:  .  Calcium Carb-Cholecalciferol (CALTRATE 600+D3) 600-800 MG-UNIT TABS, Take 1 tablet by mouth 2 (two) times a day. , Disp: , Rfl:  .  cholecalciferol (VITAMIN D) 25 MCG (1000 UT) tablet, Take 1,000 Units by mouth daily., Disp: , Rfl:  .  citalopram (CELEXA) 20 MG tablet, Take 20 mg by mouth daily., Disp: , Rfl:  .  clotrimazole-betamethasone (LOTRISONE) cream, Apply 1 application topically 2 (two) times daily., Disp: 30 g, Rfl: 0 .  donepezil (ARICEPT) 10 MG tablet, Take 10 mg by mouth every evening., Disp: , Rfl:  .  fluticasone (FLONASE) 50 MCG/ACT nasal spray, Place 1 spray into both nostrils daily as needed for allergies. , Disp: , Rfl:  .  gabapentin (NEURONTIN) 300 MG capsule, Take 300 mg by mouth 3 (three) times daily. , Disp: , Rfl:  .  hydrocortisone 2.5 % cream, hydrocortisone 2.5 % topical cream  APPLY ONCE A DAY, Disp: , Rfl:  .  ketoconazole (NIZORAL) 2 % shampoo, Apply 1 application topically once a week. , Disp: , Rfl: 2 .  loratadine (CLARITIN) 10 MG tablet, Take 10 mg by mouth daily., Disp: , Rfl:  .  LYRICA 25 MG capsule, Take 25 mg by mouth 2 (  two) times daily. , Disp: , Rfl: 5 .  NON FORMULARY, Peripheral Neuropathy Cream: Bupivacaine 1%/ Doxepin 3%/ Gabapentin 6%/ Pentoxifyline 3%/ topiramate1%  SIG: Apply 1-2 grams to affected area 3-4 times daily 1 refill, Disp: , Rfl:  .  zolpidem (AMBIEN) 10 MG tablet, TAKE 1 TABLET (10 MG TOTAL) BY MOUTH AT BEDTIME AS NEEDED FOR SLEEP., Disp: 90 tablet, Rfl: 0  Social History   Tobacco Use  Smoking Status Former Smoker  . Packs/day: 0.50  . Years: 20.00  . Pack years: 10.00  . Types: Cigarettes  . Quit date: 03/20/1982  . Years since quitting: 37.0  Smokeless Tobacco Never Used    No Known Allergies Objective:  There were no vitals filed for this visit. There is no height or weight on file to calculate BMI. Constitutional Well developed. Well nourished.    Vascular Dorsalis pedis pulses palpable bilaterally. Posterior tibial pulses palpable bilaterally. Capillary refill normal to all digits.  No cyanosis or clubbing noted. Pedal hair growth normal.  Neurologic Normal speech. Oriented to person, place, and time. Decreased sensation to the bilateral plantar lower extremity.  Dermatologic  pain on palpation to the left lateral leg.  Xerosis noted to the lateral portion of the leg as well as generalized xerosis to the entire lower extremity.  Orthopedic: Normal joint ROM without pain or crepitus bilaterally. No visible deformities. No bony tenderness.   Radiographs: 2 views of skeletally mature adult foot and ankle: There is uneven decrease in joint space at the ankle joint consistent with arthritic changes/degenerative joint disease.  Mild arthritic changes noted at the dorsal midfoot.  No other bony deformities noted. Assessment:   1. Foot pain, left   2. Neuropathic pain   3. Xerosis of skin    Plan:  Patient was evaluated and treated and all questions answered.  Left lateral leg xerosis/itching -I explained to the patient the etiology and various treatment options associated cirrhosis/itching.  I explained to the patient the importance of moisturization twice a day with over-the-counter lotion for dry skin.  I also explained to the patient in the setting of an itch and there may be a fungal involvement for which a clotrimazole can help.  Patient agrees with the plan and will start using a cream to the area. -Clotrimazole was dispensed -I recommended the patient will benefit from over-the-counter skin lotion.  Neuropathic pain unknown origin -I explained to the patient that he may benefit from specialized cream from a specialty pharmacy.  He states that he would like to obtain them.  I filled out the prescription and sent it over to [facility].  They will reach out to the patient to dispense the medication.   No follow-ups  on file.

## 2019-04-30 ENCOUNTER — Ambulatory Visit (INDEPENDENT_AMBULATORY_CARE_PROVIDER_SITE_OTHER): Payer: Medicare Other | Admitting: Family Medicine

## 2019-04-30 ENCOUNTER — Encounter: Payer: Self-pay | Admitting: Family Medicine

## 2019-04-30 ENCOUNTER — Other Ambulatory Visit: Payer: Self-pay

## 2019-04-30 DIAGNOSIS — G629 Polyneuropathy, unspecified: Secondary | ICD-10-CM | POA: Diagnosis not present

## 2019-04-30 DIAGNOSIS — I10 Essential (primary) hypertension: Secondary | ICD-10-CM | POA: Diagnosis not present

## 2019-04-30 DIAGNOSIS — M810 Age-related osteoporosis without current pathological fracture: Secondary | ICD-10-CM | POA: Diagnosis not present

## 2019-04-30 DIAGNOSIS — F5101 Primary insomnia: Secondary | ICD-10-CM

## 2019-04-30 DIAGNOSIS — R413 Other amnesia: Secondary | ICD-10-CM | POA: Diagnosis not present

## 2019-04-30 DIAGNOSIS — K219 Gastro-esophageal reflux disease without esophagitis: Secondary | ICD-10-CM

## 2019-04-30 MED ORDER — AMLODIPINE BESYLATE 10 MG PO TABS: 10.0000 mg | ORAL_TABLET | Freq: Every day | ORAL | 1 refills | Status: DC

## 2019-04-30 NOTE — Progress Notes (Addendum)
Virtual Visit via telephone Note  This visit type was conducted due to national recommendations for restrictions regarding the COVID-19 pandemic (e.g. social distancing).  This format is felt to be most appropriate for this patient at this time.  All issues noted in this document were discussed and addressed.  No physical exam was performed (except for noted visual exam findings with Video Visits).   I connected with Aaron Quinn Age today at 10:00 AM EST by telephone and verified that I am speaking with the correct person using two identifiers. Location patient: home Location provider: work  Persons participating in the virtual visit: patient, provider  I discussed the limitations, risks, security and privacy concerns of performing an evaluation and management service by telephone and the availability of in person appointments. I also discussed with the patient that there may be a patient responsible charge related to this service. The patient expressed understanding and agreed to proceed.  Interactive audio and video telecommunications were attempted between this provider and patient, however failed, due to patient having technical difficulties OR patient did not have access to video capability.  We continued and completed visit with audio only.   Reason for visit: follow-up  HPI: Insomnia: Taking Ambien.  Gets 8 hours of sleep nightly.  No drowsiness or alcohol intake.  Hypertension: Notes his blood pressures have been trending up in the mornings.  Have been in the 150s over 80s.  After he takes his medicine it will come down into the 130s over 80s.  Taking amlodipine 5 mg once daily.  No chest pain or shortness of breath.  Osteoporosis: Follows with endocrinology.  He is on Fosamax.  No fracture issues.  No swallowing issues.  Reflux: Patient notes rarely having symptoms where he will have burning in the left side of his chest.  Does not seem to have any inciting events though he feels  like it is reflux.  It last for couple minutes and resolves on its own.  Neuropathy: Patient notes this has progressively gotten more intense.  Feels as though it is going up his leg some.  He does follow with neurology for this.  He did discuss with podiatry.  Neurology had him increase his gabapentin to see if that would help.  Memory difficulty: He is no longer taking Aricept as it was causing weird dreams.   ROS: See pertinent positives and negatives per HPI.  Past Medical History:  Diagnosis Date  . Arthritis   . Bladder cancer ([place]) 2018   Hx TURB  . Complication of anesthesia    slow to get rid of anesthesia, over a couple of days.  . Diverticulosis   . Fibromyalgia 1995  . Glaucoma    pt denies  . History of bladder cancer 2018  . History of kidney stones   . Hyperlipidemia   . Hyperparathyroidism ([place]) 10/2017  . Hypertension   . Positive TB test   . Tubulovillous adenoma of colon     Past Surgical History:  Procedure Laterality Date  . APPENDECTOMY  2005  . CATARACT EXTRACTION, BILATERAL  2012  . COLONOSCOPY W/ BIOPSIES    . CYST EXCISION Right 1950's   thigh  . HEMORRHOID BANDING    . KNEE ARTHROSCOPY Left 2014  . KNEE CARTILAGE SURGERY Left 1951  . NASAL SINUS SURGERY  1950's  . TONSILLECTOMY  1940  . TOTAL HIP ARTHROPLASTY Right 11/13/2017   Procedure: TOTAL HIP ARTHROPLASTY;  Surgeon: Corky Mull, MD;  Location: [facility] ORS;  Service: Orthopedics;  Laterality: Right;  . TRANSURETHRAL RESECTION OF BLADDER TUMOR N/A 02/13/2017   Procedure: TRANSURETHRAL RESECTION OF BLADDER TUMOR (TURBT);  Surgeon: Abbie Sons, MD;  Location: [facility] ORS;  Service: Urology;  Laterality: N/A;  45 minutes needed  . TRANSURETHRAL RESECTION OF BLADDER TUMOR N/A 11/06/2018   Procedure: TRANSURETHRAL RESECTION OF BLADDER TUMOR (TURBT);  Surgeon: Abbie Sons, MD;  Location: [facility] ORS;  Service: Urology;  Laterality: N/A;  . VASECTOMY  1972    Family History  Problem Relation  Age of Onset  . Colon cancer Maternal Grandmother 40       Died at 37  . Heart disease Father   . Arthritis Other        Parent  . Hypertension Other        Parent, grandparent  . Diabetes Son   . Alcohol abuse Son   . Depression Son   . Liver cancer Mother   . Rheum arthritis Mother   . Colon polyps Neg Hx   . Kidney disease Neg Hx   . Esophageal cancer Neg Hx   . Gallbladder disease Neg Hx     SOCIAL HX: Former smoker   Current Outpatient Medications:  .  alendronate (FOSAMAX) 70 MG tablet, Take 70 mg by mouth every Sunday. , Disp: , Rfl:  .  amLODipine (NORVASC) 10 MG tablet, Take 1 tablet (10 mg total) by mouth daily., Disp: 90 tablet, Rfl: 1 .  APOAEQUORIN PO, Take by mouth., Disp: , Rfl:  .  Calcium Carb-Cholecalciferol (CALTRATE 600+D3) 600-800 MG-UNIT TABS, Take 1 tablet by mouth 2 (two) times a day. , Disp: , Rfl:  .  cholecalciferol (VITAMIN D) 25 MCG (1000 UT) tablet, Take 1,000 Units by mouth daily., Disp: , Rfl:  .  citalopram (CELEXA) 20 MG tablet, Take 20 mg by mouth daily., Disp: , Rfl:  .  clotrimazole-betamethasone (LOTRISONE) cream, Apply 1 application topically 2 (two) times daily., Disp: 30 g, Rfl: 0 .  fluticasone (FLONASE) 50 MCG/ACT nasal spray, Place 1 spray into both nostrils daily as needed for allergies. , Disp: , Rfl:  .  gabapentin (NEURONTIN) 300 MG capsule, Take 400 mg by mouth 3 (three) times daily., Disp: , Rfl:  .  hydrocortisone 2.5 % cream, hydrocortisone 2.5 % topical cream  APPLY ONCE A DAY, Disp: , Rfl:  .  ketoconazole (NIZORAL) 2 % shampoo, Apply 1 application topically once a week. , Disp: , Rfl: 2 .  loratadine (CLARITIN) 10 MG tablet, Take 10 mg by mouth daily., Disp: , Rfl:  .  NON FORMULARY, Peripheral Neuropathy Cream: Bupivacaine 1%/ Doxepin 3%/ Gabapentin 6%/ Pentoxifyline 3%/ topiramate1%  SIG: Apply 1-2 grams to affected area 3-4 times daily 1 refill, Disp: , Rfl:  .  zolpidem (AMBIEN) 10 MG tablet, TAKE 1 TABLET (10 MG TOTAL) BY  MOUTH AT BEDTIME AS NEEDED FOR SLEEP., Disp: 90 tablet, Rfl: 0  EXAM: This is a telehealth telephone visit and thus no physical exam was completed.  ASSESSMENT AND PLAN:  Discussed the following assessment and plan:  Essential hypertension Has been elevated recently.  We will trial amlodipine 10 mg daily.  He will monitor his blood pressure.  If he starts to have lightheadedness he will let us know.  Recheck in 3 to 4 weeks.  Neuropathy (HCC) Slight worsening of his issues.  Encouraged him to continue with his gabapentin.  Age-related osteoporosis without current pathological fracture He will continue Fosamax.  Memory deficit He will monitor.  He is no longer on Aricept.  Insomnia Continue Ambien.  GERD (gastroesophageal reflux disease) Rare symptoms.  He will monitor and if worsening he could try over-the-counter Pepcid.   No orders of the defined types were placed in this encounter.   Meds ordered this encounter  Medications  . amLODipine (NORVASC) 10 MG tablet    Sig: Take 1 tablet (10 mg total) by mouth daily.    Dispense:  90 tablet    Refill:  1     I discussed the assessment and treatment plan with the patient. The patient was provided an opportunity to ask questions and all were answered. The patient agreed with the plan and demonstrated an understanding of the instructions.   The patient was advised to call back or seek an in-person evaluation if the symptoms worsen or if the condition fails to improve as anticipated.  I provided 19 minutes of non-face-to-face time during this encounter.   [name], MD

## 2019-04-30 NOTE — Assessment & Plan Note (Signed)
Continue Ambien. 

## 2019-04-30 NOTE — Assessment & Plan Note (Signed)
He will continue Fosamax.

## 2019-04-30 NOTE — Assessment & Plan Note (Signed)
Rare symptoms.  He will monitor and if worsening he could try over-the-counter Pepcid.

## 2019-04-30 NOTE — Assessment & Plan Note (Signed)
Has been elevated recently.  We will trial amlodipine 10 mg daily.  He will monitor his blood pressure.  If he starts to have lightheadedness he will let us know.  Recheck in 3 to 4 weeks.

## 2019-04-30 NOTE — Assessment & Plan Note (Signed)
Slight worsening of his issues.  Encouraged him to continue with his gabapentin.

## 2019-04-30 NOTE — Assessment & Plan Note (Signed)
He will monitor.  He is no longer on Aricept.

## 2019-05-01 ENCOUNTER — Other Ambulatory Visit: Payer: Self-pay | Admitting: Family Medicine

## 2019-05-01 DIAGNOSIS — F5101 Primary insomnia: Secondary | ICD-10-CM

## 2019-05-16 ENCOUNTER — Other Ambulatory Visit: Payer: Self-pay | Admitting: Podiatry

## 2019-05-20 ENCOUNTER — Telehealth: Payer: Self-pay | Admitting: Family Medicine

## 2019-05-20 DIAGNOSIS — F5101 Primary insomnia: Secondary | ICD-10-CM

## 2019-05-20 NOTE — Telephone Encounter (Signed)
We sent in a 90 day supply. It may be a pharmacy or insurance issue that will only cover 30 days. Please call the pharmacy and see if there is a specific reason he only got 30 days.

## 2019-05-20 NOTE — Telephone Encounter (Signed)
I called the patient to inform him that I spoke with the pharmacy about his 90 day supply of ambien and the insurance will only pay for 30 days, I did not get a VM to leave a message will try later.  Christian Treadway,cma

## 2019-05-20 NOTE — Telephone Encounter (Signed)
I called and spoke with the patient and informed him that the pharmacy stated it was because of insurance and this is his first time using this Alicia stated that after his 30 day supply is done he can then get a 90 day supply but the first scripts has to be 30 days and I informed him to call me at the ned of this month to remind [place] to refill for 90 days and he stated he would.  Jeanette Rauth,cma

## 2019-05-20 NOTE — Telephone Encounter (Signed)
Pt only received 30 day supply of Ambien from [facility] pharmacy. He said they won't give him the 90 supply and he is wanting a prescription for 90 days. Please advise.

## 2019-06-06 ENCOUNTER — Telehealth: Payer: Self-pay

## 2019-06-06 ENCOUNTER — Encounter: Payer: Self-pay | Admitting: Family Medicine

## 2019-06-06 ENCOUNTER — Other Ambulatory Visit: Payer: Self-pay

## 2019-06-06 ENCOUNTER — Ambulatory Visit (INDEPENDENT_AMBULATORY_CARE_PROVIDER_SITE_OTHER): Payer: Medicare Other | Admitting: Family Medicine

## 2019-06-06 DIAGNOSIS — R413 Other amnesia: Secondary | ICD-10-CM | POA: Diagnosis not present

## 2019-06-06 DIAGNOSIS — I77810 Thoracic aortic ectasia: Secondary | ICD-10-CM | POA: Diagnosis not present

## 2019-06-06 DIAGNOSIS — I712 Thoracic aortic aneurysm, without rupture, unspecified: Secondary | ICD-10-CM

## 2019-06-06 DIAGNOSIS — I10 Essential (primary) hypertension: Secondary | ICD-10-CM | POA: Diagnosis not present

## 2019-06-06 DIAGNOSIS — G629 Polyneuropathy, unspecified: Secondary | ICD-10-CM | POA: Diagnosis not present

## 2019-06-06 DIAGNOSIS — R7303 Prediabetes: Secondary | ICD-10-CM

## 2019-06-06 MED ORDER — AMLODIPINE BESYLATE 5 MG PO TABS: 5.0000 mg | ORAL_TABLET | Freq: Every day | ORAL | 1 refills | Status: DC

## 2019-06-06 NOTE — Telephone Encounter (Signed)
BP Readings from patient:  05/27/2019- 122/72 05/28/2019-119/75 05/29/2019-135/76 05/31/2019-128/74 06/02/2019-129/73

## 2019-06-06 NOTE — Progress Notes (Addendum)
Virtual Visit via telephone Note  This visit type was conducted due to national recommendations for restrictions regarding the COVID-19 pandemic (e.g. social distancing).  This format is felt to be most appropriate for this patient at this time.  All issues noted in this document were discussed and addressed.  No physical exam was performed (except for noted visual exam findings with Video Visits).   I connected with Aaron Quinn today at  9:00 AM EDT by telephone and verified that I am speaking with the correct person using two identifiers. Location patient: home Location provider: home office Persons participating in the virtual visit: patient, provider  I discussed the limitations, risks, security and privacy concerns of performing an evaluation and management service by telephone and the availability of in person appointments. I also discussed with the patient that there may be a patient responsible charge related to this service. The patient expressed understanding and agreed to proceed.  Interactive audio and video telecommunications were attempted between this provider and patient, however failed, due to patient having technical difficulties OR patient did not have access to video capability.  We continued and completed visit with audio only.   Reason for visit: f/u  HPI: Hypertension: Typically 119-135/72-76.  Amlodipine 5 mg once daily.  No chest pain or shortness of breath.  Minimal ankle edema that is chronic.  He was getting lightheaded with a 10 mg dose so he decreased it to 5 mg of amlodipine.  Thoracic aortic aneurysm: No chest pain or shortness of breath.  Stable on most recent imaging.  Prediabetes: No polyuria or polydipsia.  He walks about 1.5 miles 3 to 4 days a week.  Diet consists of lots of vegetables and lean meats.  Some sandwiches.  Not many desserts or junk food.  No soda.  Neuropathy: Notes he has had this in his feet for years.  It may be getting a little  worse.  Mild in his hands over the last week or so.  Memory difficulty: Patient notes he has had a little bit more trouble with names and events.  He tries to keep a detailed calendar to help with this.  He just started on Primatene over-the-counter.   ROS: See pertinent positives and negatives per HPI.  Past Medical History:  Diagnosis Date  . Arthritis   . Bladder cancer ([place]) 2018   Hx TURB  . Complication of anesthesia    slow to get rid of anesthesia, over a couple of days.  . Diverticulosis   . Fibromyalgia 1995  . Glaucoma    pt denies  . History of bladder cancer 2018  . History of kidney stones   . Hyperlipidemia   . Hyperparathyroidism ([place]) 10/2017  . Hypertension   . Positive TB test   . Tubulovillous adenoma of colon     Past Surgical History:  Procedure Laterality Date  . APPENDECTOMY  2005  . CATARACT EXTRACTION, BILATERAL  2012  . COLONOSCOPY W/ BIOPSIES    . CYST EXCISION Right 1950's   thigh  . HEMORRHOID BANDING    . KNEE ARTHROSCOPY Left 2014  . KNEE CARTILAGE SURGERY Left 1951  . NASAL SINUS SURGERY  1950's  . TONSILLECTOMY  1940  . TOTAL HIP ARTHROPLASTY Right 11/13/2017   Procedure: TOTAL HIP ARTHROPLASTY;  Surgeon: Corky Mull, MD;  Location: [facility] OR[facility];  Service: Orthopedics;  Laterality: Right;  . TRANSURETHRAL RESECTION OF BLADDER TUMOR N/A 02/13/2017   Procedure: TRANSURETHRAL RESECTION OF BLADDER TUMOR (TURBT);  Surgeon: Abbie Sons, MD;  Location: [facility];  Service: Urology;  Laterality: N/A;  45 minutes needed  . TRANSURETHRAL RESECTION OF BLADDER TUMOR N/A 11/06/2018   Procedure: TRANSURETHRAL RESECTION OF BLADDER TUMOR (TURBT);  Surgeon: Abbie Sons, MD;  Location: [facility];  Service: Urology;  Laterality: N/A;  . VASECTOMY  1972    Family History  Problem Relation Quinn of Onset  . Colon cancer Maternal Grandmother 82       Died at 66  . Heart disease Father   . Arthritis Other        Parent  . Hypertension Other         Parent, grandparent  . Diabetes Son   . Alcohol abuse Son   . Depression Son   . Liver cancer Mother   . Rheum arthritis Mother   . Colon polyps Neg Hx   . Kidney disease Neg Hx   . Esophageal cancer Neg Hx   . Gallbladder disease Neg Hx     SOCIAL HX: Former smoker   Current Outpatient Medications:  .  alendronate (FOSAMAX) 70 MG tablet, Take 70 mg by mouth every Sunday. , Disp: , Rfl:  .  amLODipine (NORVASC) 5 MG tablet, Take 1 tablet (5 mg total) by mouth daily., Disp: 90 tablet, Rfl: 1 .  APOAEQUORIN PO, Take by mouth., Disp: , Rfl:  .  Calcium Carb-Cholecalciferol (CALTRATE 600+D3) 600-800 MG-UNIT TABS, Take 1 tablet by mouth 2 (two) times a day. , Disp: , Rfl:  .  cholecalciferol (VITAMIN D) 25 MCG (1000 UT) tablet, Take 1,000 Units by mouth daily., Disp: , Rfl:  .  citalopram (CELEXA) 20 MG tablet, Take 20 mg by mouth daily., Disp: , Rfl:  .  clotrimazole-betamethasone (LOTRISONE) cream, APPLY TO AFFECTED AREA TWICE A DAY, Disp: 30 g, Rfl: 0 .  fluticasone (FLONASE) 50 MCG/ACT nasal spray, Place 1 spray into both nostrils daily as needed for allergies. , Disp: , Rfl:  .  gabapentin (NEURONTIN) 300 MG capsule, Take 400 mg by mouth 3 (three) times daily., Disp: , Rfl:  .  hydrocortisone 2.5 % cream, hydrocortisone 2.5 % topical cream  APPLY ONCE A DAY, Disp: , Rfl:  .  ketoconazole (NIZORAL) 2 % shampoo, Apply 1 application topically once a week. , Disp: , Rfl: 2 .  loratadine (CLARITIN) 10 MG tablet, Take 10 mg by mouth daily., Disp: , Rfl:  .  NON FORMULARY, Peripheral Neuropathy Cream: Bupivacaine 1%/ Doxepin 3%/ Gabapentin 6%/ Pentoxifyline 3%/ topiramate1%  SIG: Apply 1-2 grams to affected area 3-4 times daily 1 refill, Disp: , Rfl:  .  zolpidem (AMBIEN) 10 MG tablet, TAKE 1 TABLET BY MOUTH AT BEDTIME AS NEEDED FOR SLEEP., Disp: 90 tablet, Rfl: 0  EXAM: This was a telephone visit and thus no physical exam was completed.  ASSESSMENT AND PLAN:  Discussed the following  assessment and plan:  Dilatation of thoracic aorta ([place]) CT imaging ordered to follow-up on this in September.  Essential hypertension Adequate control on amlodipine 5 mg.  We will continue to monitor.  Neuropathy ([facility]) Chronic issue.  Continue gabapentin.  Consider increasing dose in the future.  Memory deficit This continues to be an issue.  He recently started on Prevagen.  Discussed that this may or may not help.  There is no specific reason he should not use it though he should be careful with any supplements.  Prediabetes Continue diet and exercise.   Orders Placed This Encounter  Procedures  .  CT Angio Chest W/Cm &/Or Wo Cm    Reason for Referral: thoracic aortic aneurysm follow-up  Has the referral been discussed with the patient?: yes  Designated contact for the referral if not the patient (name/phone number):   Patient needs to have this completer in September 2021    Standing Status:   Future    Standing Expiration Date:   09/07/2020    Order Specific Question:   If indicated for the ordered procedure, I authorize the administration of contrast media per Radiology protocol    Answer:   Yes    Order Specific Question:   Preferred imaging location?    Answer:    [facility]    Order Specific Question:   Radiology Contrast Protocol - do NOT remove file path    Answer:   [email or web address]    Meds ordered this encounter  Medications  . amLODipine (NORVASC) 5 MG tablet    Sig: Take 1 tablet (5 mg total) by mouth daily.    Dispense:  90 tablet    Refill:  1     I discussed the assessment and treatment plan with the patient. The patient was provided an opportunity to ask questions and all were answered. The patient agreed with the plan and demonstrated an understanding of the instructions.   The patient was advised to call back or seek an in-person evaluation if the symptoms worsen or if the condition fails to improve as  anticipated.  I provided 17 minutes of non-face-to-face time during this encounter.   [name], MD

## 2019-06-06 NOTE — Telephone Encounter (Signed)
Reviewed during office visit

## 2019-06-08 NOTE — Assessment & Plan Note (Signed)
CT imaging ordered to follow-up on this in September.

## 2019-06-08 NOTE — Assessment & Plan Note (Signed)
Adequate control on amlodipine 5 mg.  We will continue to monitor.

## 2019-06-08 NOTE — Assessment & Plan Note (Signed)
Chronic issue.  Continue gabapentin.  Consider increasing dose in the future.

## 2019-06-08 NOTE — Assessment & Plan Note (Signed)
This continues to be an issue.  He recently started on Prevagen.  Discussed that this may or may not help.  There is no specific reason he should not use it though he should be careful with any supplements.

## 2019-06-08 NOTE — Assessment & Plan Note (Signed)
Continue diet and exercise. 

## 2019-06-10 NOTE — Telephone Encounter (Signed)
Pt called to follow up on the zolpidem (AMBIEN) 10 MG tablet pleasen call pt back

## 2019-06-10 NOTE — Telephone Encounter (Signed)
Patient called back and is wanting his 90 day supply  of ambien sent to the pharmacy, patient states he could only get 30 days because it was his first time filing that particular medication and now the insurance will pay for  a 90 day supply.  Betzayda Braxton,cma

## 2019-06-11 MED ORDER — ZOLPIDEM TARTRATE 10 MG PO TABS: 10.0000 mg | ORAL_TABLET | Freq: Every evening | ORAL | 0 refills | Status: DC | PRN

## 2019-06-11 NOTE — Addendum Note (Signed)
Addended by: Leone Haven on: 06/11/2019 02:41 PM   Modules accepted: Orders

## 2019-06-11 NOTE — Telephone Encounter (Signed)
Sent to pharmacy 

## 2019-06-16 NOTE — Telephone Encounter (Signed)
Patient came into office about having his zolpidem (AMBIEN) 10 MG tablet, 90 day supply refilled by his NEW mail order pharmacy, [facility] through his [facility] insurance. Patient needs a medically necessary to treat prior authorization. This can be done by online; [email or web address], phone [phone], by fax [phone]. Patient has left a copy of the letter from his insurance company up front in Dr. Ellen Henri color folder.

## 2019-06-17 ENCOUNTER — Telehealth: Payer: Self-pay | Admitting: Family Medicine

## 2019-06-17 NOTE — Telephone Encounter (Signed)
Please call the patient.  He dropped off a form from [place] [employer] regarding his Ambien.  This had a significant amount of writing on it and I wanted to see if there was a specific question that he had relating to this.  Has he been able to get his Ambien?

## 2019-06-17 NOTE — Telephone Encounter (Signed)
Basically patient needs a PA and I will start that process they did give him a 30 day supply until I do the PA.  Remington Highbaugh,cma

## 2019-06-18 NOTE — Telephone Encounter (Signed)
Noted  

## 2019-06-19 ENCOUNTER — Other Ambulatory Visit: Payer: Self-pay

## 2019-06-19 ENCOUNTER — Ambulatory Visit
Admission: RE | Admit: 2019-06-19 | Discharge: 2019-06-19 | Disposition: A | Discharge status: Home / Self Care | Payer: Medicare Other | Source: Ambulatory Visit | Attending: Family Medicine | Admitting: Family Medicine

## 2019-06-19 DIAGNOSIS — I712 Thoracic aortic aneurysm, without rupture, unspecified: Secondary | ICD-10-CM

## 2019-06-19 LAB — POCT I-STAT CREATININE: Creatinine, Ser: 1.3 mg/dL — ABNORMAL HIGH (ref 0.61–1.24)

## 2019-06-19 MED ORDER — IOHEXOL 350 MG/ML SOLN
75.0000 mL | Freq: Once | INTRAVENOUS | Status: AC | PRN
  Administered 2019-06-19: 08:00:00 75 mL via INTRAVENOUS

## 2019-06-24 ENCOUNTER — Telehealth: Payer: Self-pay | Admitting: Family Medicine

## 2019-06-24 ENCOUNTER — Telehealth: Payer: Self-pay

## 2019-06-24 NOTE — Telephone Encounter (Signed)
Scheduled patient for Friday.  Aaron Quinn,cma

## 2019-06-24 NOTE — Telephone Encounter (Signed)
PT went to podiatrist for swollen feet. They told him to contact his PCP or cardiologist. There are no appts avail until two weeks from now. Please advise

## 2019-06-25 NOTE — Telephone Encounter (Signed)
Noted. We can discuss further on Friday.

## 2019-06-27 ENCOUNTER — Other Ambulatory Visit: Payer: Self-pay

## 2019-06-27 ENCOUNTER — Encounter: Payer: Self-pay | Admitting: Family Medicine

## 2019-06-27 ENCOUNTER — Ambulatory Visit: Payer: Medicare Other | Admitting: Family Medicine

## 2019-06-27 VITALS — BP 110/70 | HR 61 | Temp 96.8°F | Ht 70.0 in | Wt 181.0 lb

## 2019-06-27 DIAGNOSIS — F5101 Primary insomnia: Secondary | ICD-10-CM

## 2019-06-27 DIAGNOSIS — Z8551 Personal history of malignant neoplasm of bladder: Secondary | ICD-10-CM | POA: Diagnosis not present

## 2019-06-27 DIAGNOSIS — I10 Essential (primary) hypertension: Secondary | ICD-10-CM | POA: Diagnosis not present

## 2019-06-27 DIAGNOSIS — R600 Localized edema: Secondary | ICD-10-CM | POA: Insufficient documentation

## 2019-06-27 LAB — COMPREHENSIVE METABOLIC PANEL
ALT: 9 U/L (ref 0–53)
AST: 13 U/L (ref 0–37)
Albumin: 4.5 g/dL (ref 3.5–5.2)
Alkaline Phosphatase: 70 U/L (ref 39–117)
BUN: 19 mg/dL (ref 6–23)
CO2: 28 mEq/L (ref 19–32)
Calcium: 9.3 mg/dL (ref 8.4–10.5)
Chloride: 103 mEq/L (ref 96–112)
Creatinine, Ser: 1.18 mg/dL (ref 0.40–1.50)
GFR: 58.58 mL/min — ABNORMAL LOW (ref >60.00)
Glucose, Bld: 121 mg/dL — ABNORMAL HIGH (ref 70–99)
Potassium: 4.1 mEq/L (ref 3.5–5.1)
Sodium: 140 mEq/L (ref 135–145)
Total Bilirubin: 0.5 mg/dL (ref 0.2–1.2)
Total Protein: 6.7 g/dL (ref 6.0–8.3)

## 2019-06-27 LAB — CBC
HCT: 42.6 % (ref 39.0–52.0)
Hemoglobin: 14.3 g/dL (ref 13.0–17.0)
MCHC: 33.6 g/dL (ref 30.0–36.0)
MCV: 91.8 fl (ref 78.0–100.0)
Platelets: 194 10*3/uL (ref 150.0–400.0)
RBC: 4.64 Mil/uL (ref 4.22–5.81)
RDW: 13.5 % (ref 11.5–15.5)
WBC: 5.8 10*3/uL (ref 4.0–10.5)

## 2019-06-27 LAB — BRAIN NATRIURETIC PEPTIDE: Pro B Natriuretic peptide (BNP): 18 pg/mL (ref 0.0–100.0)

## 2019-06-27 LAB — TSH: TSH: 4.54 u[IU]/mL — ABNORMAL HIGH (ref 0.35–4.50)

## 2019-06-27 NOTE — Progress Notes (Signed)
  [name], MD Phone: [phone]  Aaron Quinn is a 86 y.o. male who presents today for same-day visit.  Lower extremity edema: Patient notes this is been going on for about a month or so.  Occurs throughout the day and then overnight his swelling goes down.  No chest pain, shortness of breath, orthopnea, or PND.  He is taking amlodipine.  There is no pain with the swelling.  No increase in salt intake.  BPs have been less than 140/80 on the amlodipine.  History of bladder cancer: He does continue to follow with urology.  No hematuria.  Insomnia: Continues on Ambien.  We have been working on a prior authorization for him.  Social History   Tobacco Use  Smoking Status Former Smoker  . Packs/day: 0.50  . Years: 20.00  . Pack years: 10.00  . Types: Cigarettes  . Quit date: 03/20/1982  . Years since quitting: 37.2  Smokeless Tobacco Never Used     ROS see history of present illness  Objective  Physical Exam Vitals:   06/27/19 0947  BP: 110/70  Pulse: 61  Temp: (!) 96.8 F (36 C)  SpO2: 95%    BP Readings from Last 3 Encounters:  06/27/19 110/70  02/10/19 117/74  12/25/18 125/80   Wt Readings from Last 3 Encounters:  06/27/19 181 lb (82.1 kg)  06/06/19 168 lb (76.2 kg)  04/30/19 176 lb (79.8 kg)    Physical Exam Constitutional:      General: He is not in acute distress.    Appearance: He is not diaphoretic.  Cardiovascular:     Rate and Rhythm: Normal rate and regular rhythm.     Heart sounds: Normal heart sounds.     Comments: 2+ DP and PT pulses Pulmonary:     Effort: Pulmonary effort is normal.     Breath sounds: Normal breath sounds.  Musculoskeletal:     Right lower leg: No edema.     Left lower leg: No edema.  Skin:    General: Skin is warm and dry.  Neurological:     Mental Status: He is alert.      Assessment/Plan: Please see individual problem list.  Essential hypertension Adequate control.  He is having some swelling.  We are  checking labs and then may change his amlodipine.  Continue amlodipine for now.  Bilateral leg edema Suspect venous insufficiency or amlodipine.  Unlikely to be a cardiac cause.  We will check lab work to rule out underlying causes.  Discussed the potential for compression stockings.  History of bladder cancer He will monitor for any hematuria and contact us or his urologist if this occurs.  He will continue to see his urologist.  Insomnia Will contact him when we have a decision on his PA.    Orders Placed This Encounter  Procedures  . Comp Met (CMET)  . CBC  . TSH  . B Nat Peptide    No orders of the defined types were placed in this encounter.   This visit occurred during the SARS-CoV-2 public health emergency.  Safety protocols were in place, including screening questions prior to the visit, additional usage of staff PPE, and extensive cleaning of exam room while observing appropriate contact time as indicated for disinfecting solutions.    [name], MD [place]

## 2019-06-27 NOTE — Assessment & Plan Note (Addendum)
He will monitor for any hematuria and contact us or his urologist if this occurs.  He will continue to see his urologist.

## 2019-06-27 NOTE — Patient Instructions (Signed)
Nice to see you. We will check lab work today and contact you with the results. We will contact you once we have a decision on your prior authorization for your Ambien.

## 2019-06-27 NOTE — Assessment & Plan Note (Addendum)
Adequate control.  He is having some swelling.  We are checking labs and then may change his amlodipine.  Continue amlodipine for now.

## 2019-06-27 NOTE — Assessment & Plan Note (Signed)
Will contact him when we have a decision on his PA.

## 2019-06-27 NOTE — Assessment & Plan Note (Signed)
Suspect venous insufficiency or amlodipine.  Unlikely to be a cardiac cause.  We will check lab work to rule out underlying causes.  Discussed the potential for compression stockings.

## 2019-07-03 ENCOUNTER — Other Ambulatory Visit: Payer: Self-pay | Admitting: Family Medicine

## 2019-07-03 DIAGNOSIS — I10 Essential (primary) hypertension: Secondary | ICD-10-CM

## 2019-07-03 MED ORDER — LOSARTAN POTASSIUM 50 MG PO TABS: 50.0000 mg | ORAL_TABLET | Freq: Every day | ORAL | 3 refills | Status: DC

## 2019-07-07 ENCOUNTER — Other Ambulatory Visit: Payer: Self-pay | Admitting: Family Medicine

## 2019-07-07 ENCOUNTER — Telehealth: Payer: Self-pay | Admitting: Family Medicine

## 2019-07-07 ENCOUNTER — Other Ambulatory Visit: Payer: Self-pay | Admitting: Podiatry

## 2019-07-14 ENCOUNTER — Other Ambulatory Visit: Payer: Self-pay

## 2019-07-14 ENCOUNTER — Telehealth: Payer: Self-pay | Admitting: Family Medicine

## 2019-07-14 ENCOUNTER — Other Ambulatory Visit (INDEPENDENT_AMBULATORY_CARE_PROVIDER_SITE_OTHER): Payer: Medicare Other

## 2019-07-14 DIAGNOSIS — F5101 Primary insomnia: Secondary | ICD-10-CM

## 2019-07-14 DIAGNOSIS — I10 Essential (primary) hypertension: Secondary | ICD-10-CM

## 2019-07-14 LAB — BASIC METABOLIC PANEL
BUN: 15 mg/dL (ref 6–23)
CO2: 29 mEq/L (ref 19–32)
Calcium: 9.1 mg/dL (ref 8.4–10.5)
Chloride: 103 mEq/L (ref 96–112)
Creatinine, Ser: 1.21 mg/dL (ref 0.40–1.50)
GFR: 56.9 mL/min — ABNORMAL LOW (ref >60.00)
Glucose, Bld: 159 mg/dL — ABNORMAL HIGH (ref 70–99)
Potassium: 4.6 mEq/L (ref 3.5–5.1)
Sodium: 139 mEq/L (ref 135–145)

## 2019-07-14 MED ORDER — ZOLPIDEM TARTRATE 10 MG PO TABS: 10.0000 mg | ORAL_TABLET | Freq: Every evening | ORAL | 0 refills | Status: DC | PRN

## 2019-07-14 NOTE — Telephone Encounter (Signed)
Pt needs a refill on zolpidem (AMBIEN) 10 MG tablet [facility]  He said that he only got a 30 day supply

## 2019-07-14 NOTE — Telephone Encounter (Signed)
RX was placed 06-11-2019 for 90 days. Unsure why he only received 30 day RX. Please advise on another order.

## 2019-07-14 NOTE — Telephone Encounter (Signed)
His insurance may only pay for 30 tablets at a time or his pharmacy may only be willing to fill 30 days at a time. Refill sent to pharmacy.

## 2019-07-14 NOTE — Addendum Note (Signed)
Addended by: Caryl Bis Raywood Wailes G on: 07/14/2019 01:16 PM   Modules accepted: Orders

## 2019-08-11 ENCOUNTER — Encounter: Payer: Self-pay | Admitting: Urology

## 2019-08-11 ENCOUNTER — Ambulatory Visit (INDEPENDENT_AMBULATORY_CARE_PROVIDER_SITE_OTHER): Payer: Medicare Other | Admitting: Urology

## 2019-08-11 ENCOUNTER — Other Ambulatory Visit: Payer: Self-pay

## 2019-08-11 ENCOUNTER — Other Ambulatory Visit: Payer: Medicare Other | Admitting: Urology

## 2019-08-11 VITALS — BP 177/81 | HR 52 | Ht 70.0 in | Wt 169.0 lb

## 2019-08-11 DIAGNOSIS — C679 Malignant neoplasm of bladder, unspecified: Secondary | ICD-10-CM

## 2019-08-11 DIAGNOSIS — Z8551 Personal history of malignant neoplasm of bladder: Secondary | ICD-10-CM

## 2019-08-11 LAB — URINALYSIS, COMPLETE
Bilirubin, UA: NEGATIVE
Glucose, UA: NEGATIVE
Ketones, UA: NEGATIVE
Nitrite, UA: NEGATIVE
Protein,UA: NEGATIVE
Specific Gravity, UA: 1.025 (ref 1.005–1.030)
Urobilinogen, Ur: 0.2 mg/dL (ref 0.2–1.0)
pH, UA: 6.5 (ref 5.0–7.5)

## 2019-08-11 LAB — MICROSCOPIC EXAMINATION
Bacteria, UA: NONE SEEN
WBC, UA: >30 /hpf — AB (ref 0–5)

## 2019-08-11 MED ORDER — CIPROFLOXACIN HCL 500 MG PO TABS
500.0000 mg | ORAL_TABLET | Freq: Once | ORAL | Status: AC
  Administered 2019-08-11: 500 mg via ORAL

## 2019-08-11 NOTE — Progress Notes (Signed)
   08/11/19  CC:  Chief Complaint  Patient presents with  . Cysto    Urologic history: -HG Taurothelial carcinoma November 2018 -Induction BCG completed February 2019 -TURBT 10/2018 for posterior wall erythema; path Urothelial mucosa with cystitis cystica, reactive lymphoid follicles  HPI:  Blood pressure (!) 177/81, pulse (!) 52, height 5\' 10"  (1.778 m), weight 169 lb (76.7 kg). NED. A&Ox3.   No respiratory distress   Abd soft, NT, ND Normal phallus with bilateral descended testicles  Cystoscopy Procedure Note  Patient identification was confirmed, informed consent was obtained, and patient was prepped using Betadine solution.  Lidocaine jelly was administered per urethral meatus.     Pre-Procedure: - Inspection reveals a normal caliber urethral meatus.  Procedure: The flexible cystoscope was introduced without difficulty - No urethral strictures/lesions are present. - Prominent lateral lobe enlargement prostate  - Mild elevation bladder neck - Bilateral ureteral orifices identified - Bladder mucosa  reveals no ulcers, tumors, or lesions.  Mild posterior wall erythema - No bladder stones - No trabeculation  Retroflexion shows no tumor or intravesical median lobe   Post-Procedure: - Patient tolerated the procedure well  Assessment/ Plan: -No evidence recurrent bladder tumors -Mild posterior wall erythema -Urine cytology sent -Follow-up surveillance cystoscopy 6 months if cytology shows no significant abnormalities   Abbie Sons, MD

## 2019-08-11 NOTE — Addendum Note (Signed)
Addended by: Evelina Bucy on: 08/11/2019 04:18 PM   Modules accepted: Orders

## 2019-08-13 ENCOUNTER — Other Ambulatory Visit: Payer: Medicare Other | Admitting: Urology

## 2019-08-19 ENCOUNTER — Other Ambulatory Visit: Payer: Self-pay | Admitting: Urology

## 2019-08-19 ENCOUNTER — Ambulatory Visit (INDEPENDENT_AMBULATORY_CARE_PROVIDER_SITE_OTHER): Payer: Medicare Other

## 2019-08-19 VITALS — Ht 70.0 in | Wt 169.0 lb

## 2019-08-19 DIAGNOSIS — Z0000 Encounter for general adult medical examination without abnormal findings: Secondary | ICD-10-CM

## 2019-08-19 NOTE — Progress Notes (Addendum)
Subjective:   Aaron Quinn is a 86 y.o. male who presents for Medicare Annual/Subsequent preventive examination.  Review of Systems:  No ROS.  Medicare Wellness Virtual Visit.   Cardiac Risk Factors include: advanced age (>70men, >57 women);male gender;hypertension     Objective:    Vitals: Ht 5\' 10"  (1.778 m)   Wt 169 lb (76.7 kg)   BMI 24.25 kg/m   Body mass index is 24.25 kg/m.  Advanced Directives 08/19/2019 11/01/2018 08/16/2018 11/13/2017 11/13/2017 10/31/2017 06/12/2017  Does Patient Have a Medical Advance Directive? Yes Yes Yes Yes Yes Yes Yes  Type of Paramedic of [place];Living will [place];Living will Living will;Healthcare Power of [place];Living will [place];Living will [place];Living will Living will;Healthcare Power of Attorney  Does patient want to make changes to medical advance directive? No - Patient declined - No - Patient declined No - Patient declined No - Patient declined No - Patient declined -  Copy of [place] in Chart? Yes - validated most recent copy scanned in chart (See row information) Yes - validated most recent copy scanned in chart (See row information) Yes - validated most recent copy scanned in chart (See row information) Yes Yes No - copy requested No - copy requested    Tobacco Social History   Tobacco Use  Smoking Status Former Smoker  . Packs/day: 0.50  . Years: 20.00  . Pack years: 10.00  . Types: Cigarettes  . Quit date: 03/20/1982  . Years since quitting: 37.4  Smokeless Tobacco Never Used     Counseling given: Not Answered   Clinical Intake:  Pre-visit preparation completed: Yes        Diabetes: No  How often do you need to have someone help you when you read instructions, pamphlets, or other written materials from your doctor or pharmacy?: 1 - Never  Interpreter Needed?: No     Past  Medical History:  Diagnosis Date  . Arthritis   . Bladder cancer ([place]) 2018   Hx TURB  . Complication of anesthesia    slow to get rid of anesthesia, over a couple of days.  . Diverticulosis   . Fibromyalgia 1995  . Glaucoma    pt denies  . History of bladder cancer 2018  . History of kidney stones   . Hyperlipidemia   . Hyperparathyroidism ([place]) 10/2017  . Hypertension   . Positive TB test   . Tubulovillous adenoma of colon    Past Surgical History:  Procedure Laterality Date  . APPENDECTOMY  2005  . CATARACT EXTRACTION, BILATERAL  2012  . COLONOSCOPY W/ BIOPSIES    . CYST EXCISION Right 1950's   thigh  . HEMORRHOID BANDING    . KNEE ARTHROSCOPY Left 2014  . KNEE CARTILAGE SURGERY Left 1951  . NASAL SINUS SURGERY  1950's  . TONSILLECTOMY  1940  . TOTAL HIP ARTHROPLASTY Right 11/13/2017   Procedure: TOTAL HIP ARTHROPLASTY;  Surgeon: Corky Mull, MD;  Location: [facility] ORS;  Service: Orthopedics;  Laterality: Right;  . TRANSURETHRAL RESECTION OF BLADDER TUMOR N/A 02/13/2017   Procedure: TRANSURETHRAL RESECTION OF BLADDER TUMOR (TURBT);  Surgeon: Abbie Sons, MD;  Location: [facility] ORS;  Service: Urology;  Laterality: N/A;  45 minutes needed  . TRANSURETHRAL RESECTION OF BLADDER TUMOR N/A 11/06/2018   Procedure: TRANSURETHRAL RESECTION OF BLADDER TUMOR (TURBT);  Surgeon: Abbie Sons, MD;  Location: [facility] ORS;  Service: Urology;  Laterality: N/A;  . VASECTOMY  1972   Family History  Problem Relation Age of Onset  . Colon cancer Maternal Grandmother 52       Died at 30  . Heart disease Father   . Arthritis Other        Parent  . Hypertension Other        Parent, grandparent  . Diabetes Son   . Alcohol abuse Son   . Depression Son   . Liver cancer Mother   . Rheum arthritis Mother   . Colon polyps Neg Hx   . Kidney disease Neg Hx   . Esophageal cancer Neg Hx   . Gallbladder disease Neg Hx    Social History   Socioeconomic History  . Marital status: Married      Spouse name: Not on file  . Number of children: 2  . Years of education: Not on file  . Highest education level: Not on file  Occupational History  . Occupation: Retired  Tobacco Use  . Smoking status: Former Smoker    Packs/day: 0.50    Years: 20.00    Pack years: 10.00    Types: Cigarettes    Quit date: 03/20/1982    Years since quitting: 37.4  . Smokeless tobacco: Never Used  Substance and Sexual Activity  . Alcohol use: No    Alcohol/week: 0.0 standard drinks  . Drug use: No  . Sexual activity: Yes  Other Topics Concern  . Not on file  Social History Narrative   Retired from [employer] Strain:   . Difficulty of Paying Living Expenses:   Food Insecurity:   . Worried About Charity fundraiser in the Last Year:   . Arboriculturist in the Last Year:   Transportation Needs:   . Film/video editor (Medical):   Marland Kitchen Lack of Transportation (Non-Medical):   Physical Activity:   . Days of Exercise per Week:   . Minutes of Exercise per Session:   Stress:   . Feeling of Stress :   Social Connections:   . Frequency of Communication with Friends and Family:   . Frequency of Social Gatherings with Friends and Family:   . Attends Religious Services:   . Active Member of Clubs or Organizations:   . Attends Archivist Meetings:   Marland Kitchen Marital Status:     Outpatient Encounter Medications as of 08/19/2019  Medication Sig  . Calcium Carb-Cholecalciferol (CALTRATE 600+D3) 600-800 MG-UNIT TABS Take 1 tablet by mouth 2 (two) times a day.   . cholecalciferol (VITAMIN D) 25 MCG (1000 UT) tablet Take 1,000 Units by mouth daily.  . citalopram (CELEXA) 20 MG tablet Take 20 mg by mouth daily.  . clotrimazole-betamethasone (LOTRISONE) cream APPLY TO AFFECTED AREA TWICE A DAY  . fluticasone (FLONASE) 50 MCG/ACT nasal spray Place 1 spray into both nostrils daily as needed for allergies.   Marland Kitchen gabapentin (NEURONTIN) 300 MG  capsule Take 400 mg by mouth 3 (three) times daily.  . hydrocortisone 2.5 % cream hydrocortisone 2.5 % topical cream  APPLY ONCE A DAY  . ketoconazole (NIZORAL) 2 % shampoo Apply 1 application topically once a week.   . loratadine (CLARITIN) 10 MG tablet Take 10 mg by mouth daily.  Marland Kitchen losartan (COZAAR) 50 MG tablet Take 1 tablet (50 mg total) by mouth daily.  . memantine (NAMENDA) 5 MG tablet Take 5 mg by  mouth 2 (two) times daily.  . NON FORMULARY Peripheral Neuropathy Cream: Bupivacaine 1%/ Doxepin 3%/ Gabapentin 6%/ Pentoxifyline 3%/ topiramate1%  SIG: Apply 1-2 grams to affected area 3-4 times daily 1 refill  . zolpidem (AMBIEN) 10 MG tablet Take 1 tablet (10 mg total) by mouth at bedtime as needed. for sleep   No facility-administered encounter medications on file as of 08/19/2019.    Activities of Daily Living In your present state of health, do you have any difficulty performing the following activities: 08/19/2019 11/01/2018  Hearing? N Y  Vision? N N  Difficulty concentrating or making decisions? N N  Walking or climbing stairs? N N  Dressing or bathing? N N  Doing errands, shopping? N N  Preparing Food and eating ? N -  Using the Toilet? N -  In the past six months, have you accidently leaked urine? N -  Do you have problems with loss of bowel control? N -  Managing your Medications? N -  Managing your Finances? N -  Housekeeping or managing your Housekeeping? N -  Some recent data might be hidden    Patient Care Team: Leone Haven, MD as PCP - General (Family Medicine)   Assessment:   This is a routine wellness examination for Center.  I connected with Jemir today by telephone and verified that I am speaking with the correct person using two identifiers. Location patient: home Location provider: work Persons participating in the virtual visit: patient, LPN.    I discussed the limitations, risks, security and privacy concerns of performing an evaluation and  management service by telephone and the availability of in person appointments.    Interactive audio and video telecommunications were attempted between this provider and patient, however failed, due to patient having technical difficulties OR patient did not have access to video capability.  We continued and completed visit with audio only.  Some vital signs may be absent or patient reported.  Health Maintenance Due: See completed HM at the end of note.   Eye: Visual acuity not assessed, virtual visit.  They have seen their ophthalmologist in the last 12 months. Wears glasses.   Dental: Visits every 6 months.    Hearing: Demonstrates normal hearing during visit.  Safety:  Patient feels safe at home- yes Patient does have smoke detectors at home- yes Patient does wear sunscreen or protective clothing when in direct sunlight - yes Patient does wear seat belt when in a moving vehicle - yes Patient drives- yes Adequate lighting in walkways free from debris- yes Grab bars and handrails used as appropriate- yes Ambulates with an assistive device- no Cell phone on person when ambulating/driving- No. Plans to start carrying for safety measures.    Social: Alcohol intake - no       Smoking history- former  Smokers in home? none Illicit drug use? none  Medication: Taking as directed and without issues.  Pill box in use -no Self managed - yes   Covid-19: Vaccines completed.   Activities of Daily Living Patient denies needing assistance with: household chores, feeding themselves, getting from bed to chair, getting to the toilet, bathing/showering, dressing, managing money, or preparing meals. Wife to assist as needed.    Discussed the importance of a healthy diet, water intake and the benefits of aerobic exercise.   Physical activity- walking 1.5 mile, 3 times weekly.  Diet:  Regular Water: fair intake Caffeine: occasional coke  Other Providers Patient Care  Team: Leone Haven, MD  as PCP - General (Family Medicine)   Exercise Activities and Dietary recommendations Current Exercise Habits: Home exercise routine, Type of exercise: walking, Time (Minutes): 45, Frequency (Times/Week): 3, Weekly Exercise (Minutes/Week): 135, Intensity: Moderate  Goals      Patient Stated   . Follow up with Primary Care Provider (pt-stated)     Take all medications as prescribed Eat a healthy diet        Fall Risk Fall Risk  08/19/2019 06/06/2019 12/25/2018 03/06/2018 04/20/2017  Falls in the past year? 0 0 0 0 No  Number falls in past yr: - 0 0 0 -  Injury with Fall? - - - 0 -  Follow up Falls evaluation completed Falls evaluation completed Falls evaluation completed - -   Is the patient's home free of loose throw rugs in walkways, pet beds, electrical cords, etc?  Yes      Handrails on the stairs?  Yes      Adequate lighting?  Yes  Timed Get Up and Go Performed: No, virtual visit  Depression Screen PHQ 2/9 Scores 08/19/2019 06/06/2019 12/25/2018 08/16/2018  PHQ - 2 Score 0 0 0 0    Cognitive Function     6CIT Screen 08/19/2019 08/16/2018 04/20/2017 04/11/2016  What Year? 0 points 0 points 0 points 0 points  What month? 0 points 0 points 0 points 0 points  What time? - 0 points 0 points 0 points  Count back from 20 - 0 points 0 points 0 points  Months in reverse 0 points 0 points 0 points 0 points  Repeat phrase - 0 points - 0 points  Total Score - 0 - 0  Patient is alert x3. Enjoys reading for brain exercise. Taking Namenda as directed. Followed by Neurology, Gurney Maxin.  Immunization History  Administered Date(s) Administered  . Fluad Quad(high Dose 65+) 11/27/2018  . Influenza, High Dose Seasonal PF 01/07/2016, 12/20/2016, 01/16/2018  . Influenza-Unspecified 12/18/2014, 12/20/2016  . PFIZER SARS-COV-2 Vaccination 04/03/2019, 04/26/2019  . Pneumococcal Conjugate-13 04/20/2017  . Pneumococcal Polysaccharide-23 05/23/2018  . Tdap 04/09/2015,  08/31/2017  . Zoster Recombinat (Shingrix) 12/20/2016, 04/20/2017   Screening Tests Health Maintenance  Topic Date Due  . INFLUENZA VACCINE  10/19/2019  . TETANUS/TDAP  09/01/2027  . COVID-19 Vaccine  Completed  . PNA vac Low Risk Adult  Completed       Plan:   Keep all routine maintenance appointments.   Follow up 09/26/19 @ 9:30  Medicare Attestation I have personally reviewed: The patient's medical and social history Their use of alcohol, tobacco or illicit drugs Their current medications and supplements The patient's functional ability including ADLs,fall risks, home safety risks, cognitive, and hearing and visual impairment Diet and physical activities Evidence for depression   I have reviewed and discussed with patient certain preventive protocols, quality metrics, and best practice recommendations.      Varney Biles, LPN  [id number]

## 2019-08-19 NOTE — Patient Instructions (Addendum)
  Aaron Quinn , Thank you for taking time to come for your Medicare Wellness Visit. I appreciate your ongoing commitment to your health goals. Please review the following plan we discussed and let me know if I can assist you in the future.   These are the goals we discussed: Goals      Patient Stated   . Follow up with Primary Care Provider (pt-stated)     Take all medications as prescribed Eat a healthy diet        This is a list of the screening recommended for you and due dates:  Health Maintenance  Topic Date Due  . Flu Shot  10/19/2019  . Tetanus Vaccine  09/01/2027  . COVID-19 Vaccine  Completed  . Pneumonia vaccines  Completed

## 2019-08-19 NOTE — Progress Notes (Signed)
I have reviewed the above note and agree.  Lashon Hillier, M.D.  

## 2019-08-21 ENCOUNTER — Telehealth: Payer: Self-pay | Admitting: Urology

## 2019-08-21 NOTE — Telephone Encounter (Signed)
Urine cytology showed no abnormal cells.  Follow-up as scheduled 

## 2019-09-26 ENCOUNTER — Ambulatory Visit: Payer: Medicare Other | Admitting: Family Medicine

## 2019-10-14 ENCOUNTER — Ambulatory Visit (INDEPENDENT_AMBULATORY_CARE_PROVIDER_SITE_OTHER): Payer: Medicare Other | Admitting: Family Medicine

## 2019-10-14 ENCOUNTER — Encounter: Payer: Self-pay | Admitting: Family Medicine

## 2019-10-14 ENCOUNTER — Other Ambulatory Visit: Payer: Self-pay

## 2019-10-14 DIAGNOSIS — F5101 Primary insomnia: Secondary | ICD-10-CM | POA: Diagnosis not present

## 2019-10-14 DIAGNOSIS — G8929 Other chronic pain: Secondary | ICD-10-CM | POA: Diagnosis not present

## 2019-10-14 DIAGNOSIS — I10 Essential (primary) hypertension: Secondary | ICD-10-CM

## 2019-10-14 DIAGNOSIS — M545 Low back pain: Secondary | ICD-10-CM | POA: Diagnosis not present

## 2019-10-14 MED ORDER — LOSARTAN POTASSIUM 100 MG PO TABS: 100.0000 mg | ORAL_TABLET | Freq: Every day | ORAL | 1 refills | Status: DC

## 2019-10-14 NOTE — Patient Instructions (Signed)
Nice to see you. We are going to increase your losartan to 100 mg once daily.  You can use up your current supply by taking two 50 mg tablets once daily.  Once you finish that there is a new prescription for a 100 mg tablet at your pharmacy and you will only take 1 of those. Please check your blood pressure for the next 2 to 3 weeks and send me some readings around that time. We will have labs in 1 week. Please monitor your back pain and if it does not improve over the next several weeks please let us know.

## 2019-10-14 NOTE — Progress Notes (Signed)
[name], MD Phone: [phone]  Aaron Quinn is a 86 y.o. male who presents today for f/u.  HYPERTENSION  Disease Monitoring  Home BP Monitoring 110-152/76-86 Chest pain- no    Dyspnea- no Medications  Compliance-  Taking losartan.   Edema- no BP has trended up since changing to losartan.  Insomnia: Taking Ambien.  This is effective.  He gets 8 hours of sleep.  No drowsiness.  No alcohol intake.  Low back discomfort: He has had a dull ache in his low back for 3 to 4 days.  It does not limit any activities.  He does have a history of this in the past and it ended up being his right hip.  That hip has been replaced.  No injury.  No radiation.  No numbness.  No weakness.  No incontinence.  Is not tried any medicines for this.    Social History   Tobacco Use  Smoking Status Former Smoker  . Packs/day: 0.50  . Years: 20.00  . Pack years: 10.00  . Types: Cigarettes  . Quit date: 03/20/1982  . Years since quitting: 37.5  Smokeless Tobacco Never Used     ROS see history of present illness  Objective  Physical Exam Vitals:   10/14/19 1032  BP: (!) 140/80  Pulse: 59  Temp: 98.2 F (36.8 C)  SpO2: 95%    BP Readings from Last 3 Encounters:  10/14/19 (!) 140/80  08/11/19 (!) 177/81  06/27/19 110/70   Wt Readings from Last 3 Encounters:  10/14/19 181 lb 3.2 oz (82.2 kg)  08/19/19 169 lb (76.7 kg)  08/11/19 169 lb (76.7 kg)    Physical Exam Constitutional:      General: He is not in acute distress.    Appearance: He is not diaphoretic.  Cardiovascular:     Rate and Rhythm: Normal rate and regular rhythm.     Heart sounds: Normal heart sounds.  Pulmonary:     Effort: Pulmonary effort is normal.     Breath sounds: Normal breath sounds.  Musculoskeletal:     Comments: No midline spine tenderness, no midline spine step-off, no muscular back tenderness, full range of motion bilateral hips with no discomfort  Skin:    General: Skin is warm and dry.    Neurological:     Mental Status: He is alert.     Comments: 5/5 strength bilateral quads, hamstrings, plantar flexion, and dorsiflexion, sensation light touch intact bilateral lower extremities      Assessment/Plan: Please see individual problem list.  Essential hypertension BP has trended up.  We will increase his losartan to 100 mg once daily.  He will return for labs in 7 to 10 days.  He will send [place] a message with his readings in 3 to 4 weeks.  Follow-up with me in 2 months.  Insomnia Continue Ambien.  Chronic bilateral low back pain without sciatica Chronic intermittent issue.  Suspect muscle strain versus arthritis.  He will monitor for several weeks and if not improving let us know.  Discussed he could use heat or ice on it.  Advised to stay active.   Orders Placed This Encounter  Procedures  . Basic Metabolic Panel (BMET)    Standing Status:   Future    Standing Expiration Date:   10/13/2020    Meds ordered this encounter  Medications  . losartan (COZAAR) 100 MG tablet    Sig: Take 1 tablet (100 mg total) by mouth daily.    Dispense:  90 tablet    Refill:  1    This visit occurred during the SARS-CoV-2 public health emergency.  Safety protocols were in place, including screening questions prior to the visit, additional usage of staff PPE, and extensive cleaning of exam room while observing appropriate contact time as indicated for disinfecting solutions.    [name], MD [place]

## 2019-10-14 NOTE — Assessment & Plan Note (Signed)
BP has trended up.  We will increase his losartan to 100 mg once daily.  He will return for labs in 7 to 10 days.  He will send [place] a message with his readings in 3 to 4 weeks.  Follow-up with me in 2 months.

## 2019-10-14 NOTE — Assessment & Plan Note (Signed)
Chronic intermittent issue.  Suspect muscle strain versus arthritis.  He will monitor for several weeks and if not improving let us know.  Discussed he could use heat or ice on it.  Advised to stay active.

## 2019-10-14 NOTE — Assessment & Plan Note (Signed)
Continue Ambien. 

## 2019-10-21 ENCOUNTER — Other Ambulatory Visit: Payer: Self-pay

## 2019-10-21 ENCOUNTER — Other Ambulatory Visit (INDEPENDENT_AMBULATORY_CARE_PROVIDER_SITE_OTHER): Payer: Medicare Other

## 2019-10-21 DIAGNOSIS — I10 Essential (primary) hypertension: Secondary | ICD-10-CM | POA: Diagnosis not present

## 2019-10-21 LAB — BASIC METABOLIC PANEL
BUN: 24 mg/dL — ABNORMAL HIGH (ref 6–23)
CO2: 29 mEq/L (ref 19–32)
Calcium: 9.5 mg/dL (ref 8.4–10.5)
Chloride: 106 mEq/L (ref 96–112)
Creatinine, Ser: 1.3 mg/dL (ref 0.40–1.50)
GFR: 52.34 mL/min — ABNORMAL LOW (ref >60.00)
Glucose, Bld: 136 mg/dL — ABNORMAL HIGH (ref 70–99)
Potassium: 5.4 mEq/L — ABNORMAL HIGH (ref 3.5–5.1)
Sodium: 140 mEq/L (ref 135–145)

## 2019-10-22 ENCOUNTER — Other Ambulatory Visit: Payer: Self-pay | Admitting: Family Medicine

## 2019-10-22 DIAGNOSIS — E875 Hyperkalemia: Secondary | ICD-10-CM

## 2019-10-23 ENCOUNTER — Other Ambulatory Visit: Payer: Self-pay | Admitting: Family Medicine

## 2019-10-23 DIAGNOSIS — I10 Essential (primary) hypertension: Secondary | ICD-10-CM

## 2019-10-24 ENCOUNTER — Encounter: Payer: Self-pay | Admitting: Family Medicine

## 2019-10-27 ENCOUNTER — Other Ambulatory Visit (INDEPENDENT_AMBULATORY_CARE_PROVIDER_SITE_OTHER): Payer: Medicare Other

## 2019-10-27 ENCOUNTER — Other Ambulatory Visit: Payer: Self-pay

## 2019-10-27 DIAGNOSIS — E875 Hyperkalemia: Secondary | ICD-10-CM | POA: Diagnosis not present

## 2019-10-27 LAB — BASIC METABOLIC PANEL
BUN: 17 mg/dL (ref 6–23)
CO2: 29 mEq/L (ref 19–32)
Calcium: 9.3 mg/dL (ref 8.4–10.5)
Chloride: 102 mEq/L (ref 96–112)
Creatinine, Ser: 1.16 mg/dL (ref 0.40–1.50)
GFR: 59.7 mL/min — ABNORMAL LOW (ref >60.00)
Glucose, Bld: 128 mg/dL — ABNORMAL HIGH (ref 70–99)
Potassium: 4.4 mEq/L (ref 3.5–5.1)
Sodium: 139 mEq/L (ref 135–145)

## 2019-10-27 NOTE — Telephone Encounter (Signed)
See result note from labs on 10/27/19.

## 2019-10-29 ENCOUNTER — Telehealth: Payer: Self-pay | Admitting: Family Medicine

## 2019-10-29 DIAGNOSIS — F5101 Primary insomnia: Secondary | ICD-10-CM

## 2019-10-29 DIAGNOSIS — I10 Essential (primary) hypertension: Secondary | ICD-10-CM

## 2019-10-29 MED ORDER — ZOLPIDEM TARTRATE 10 MG PO TABS: 10.0000 mg | ORAL_TABLET | Freq: Every evening | ORAL | 0 refills | Status: DC | PRN

## 2019-10-29 MED ORDER — HYDROCHLOROTHIAZIDE 12.5 MG PO TABS: 12.5000 mg | ORAL_TABLET | Freq: Every day | ORAL | 1 refills | Status: DC

## 2019-10-29 MED ORDER — LOSARTAN POTASSIUM 50 MG PO TABS: 50.0000 mg | ORAL_TABLET | Freq: Every day | ORAL | 1 refills | Status: AC

## 2019-10-29 NOTE — Telephone Encounter (Signed)
Patient stated he needs a new blood pressure prescription, and he needs a refill on the Ambien sent to the pharmacy.  Aaron Quinn,cma

## 2019-10-29 NOTE — Telephone Encounter (Signed)
Sent to pharmacy.  Patient needs follow-up labs in 2 weeks.  Orders placed.  Thanks.

## 2019-10-29 NOTE — Telephone Encounter (Signed)
Pt called in stated that he received a call about prescription for new blood pressure pharmacy stated that they did not have it and had some questions about other medications wanted a call back

## 2019-10-30 NOTE — Telephone Encounter (Signed)
I called and LVM informing the patient to call and schedule a lab appointment.  Tonja Jezewski,cma

## 2019-11-13 ENCOUNTER — Other Ambulatory Visit: Payer: Medicare Other

## 2019-12-15 ENCOUNTER — Encounter: Payer: Self-pay | Admitting: Family Medicine

## 2019-12-15 ENCOUNTER — Other Ambulatory Visit: Payer: Self-pay

## 2019-12-15 ENCOUNTER — Ambulatory Visit: Payer: Medicare Other | Admitting: Family Medicine

## 2019-12-15 VITALS — BP 163/84 | HR 65 | Temp 98.2°F | Ht 70.0 in | Wt 178.6 lb

## 2019-12-15 DIAGNOSIS — Z23 Encounter for immunization: Secondary | ICD-10-CM

## 2019-12-15 DIAGNOSIS — I10 Essential (primary) hypertension: Secondary | ICD-10-CM

## 2019-12-15 DIAGNOSIS — I77810 Thoracic aortic ectasia: Secondary | ICD-10-CM

## 2019-12-15 DIAGNOSIS — F5101 Primary insomnia: Secondary | ICD-10-CM

## 2019-12-15 DIAGNOSIS — H9190 Unspecified hearing loss, unspecified ear: Secondary | ICD-10-CM | POA: Insufficient documentation

## 2019-12-15 DIAGNOSIS — J479 Bronchiectasis, uncomplicated: Secondary | ICD-10-CM

## 2019-12-15 DIAGNOSIS — R413 Other amnesia: Secondary | ICD-10-CM

## 2019-12-15 DIAGNOSIS — H9193 Unspecified hearing loss, bilateral: Secondary | ICD-10-CM

## 2019-12-15 NOTE — Assessment & Plan Note (Signed)
Chronic issue.  Mild worsening.  He does not want to work this up currently.  He will let us know when he is ready to see audiology.

## 2019-12-15 NOTE — Assessment & Plan Note (Signed)
Seen on imaging.  Possibly related to prior infection.  Will refer to pulmonology.

## 2019-12-15 NOTE — Assessment & Plan Note (Signed)
Stable on recent imaging.  Plan for yearly follow-up on this as recommended in the imaging report.

## 2019-12-15 NOTE — Assessment & Plan Note (Signed)
He will continue on Namenda through neurology.  I discussed the purpose of that medication is to slow the decline of his memory issues.

## 2019-12-15 NOTE — Assessment & Plan Note (Signed)
Chronic issue.  Stable.  Continue Ambien 10 mg once daily.

## 2019-12-15 NOTE — Progress Notes (Signed)
Aaron Rumps, MD Phone: [phone]  Aaron Quinn is a 86 y.o. male who presents today for f/u.  HYPERTENSION  Disease Monitoring  Home BP Monitoring typical range 116-145/70s-80s, occasionally runs in to the 150s though not often Chest pain- no    Dyspnea- no Medications  Compliance-  Taking HCTZ 12.5 mg daily, and losartan 50 mg daily. Lightheadedness-  Rare on rising from bed in the am  Edema- no  Memory difficulty: Follows with neurology.  Currently on Namenda.  He notes maybe his memory has gotten a little worse recently.  He is occasionally forgotten directions to where he is going though has not had issues with getting lost.  Insomnia: Chronic issue.  On Ambien.  Notes this works well for him and he has no drowsiness the next day.  Ascending thoracic aortic dilatation: Stable on CT imaging in April.  Bronchiectasis: Noted on CT from April.  Patient does note a history of pneumonia in the distant past.  No cough.  No shortness of breath.  Has not seen pulmonology.    Social History   Tobacco Use  Smoking Status Former Smoker  . Packs/day: 0.50  . Years: 20.00  . Pack years: 10.00  . Types: Cigarettes  . Quit date: 03/20/1982  . Years since quitting: 37.7  Smokeless Tobacco Never Used     ROS see history of present illness  Objective  Physical Exam Vitals:   12/15/19 1105  BP: (!) 163/84  Pulse: 65  Temp: 98.2 F (36.8 C)  SpO2: 96%    Blood pressure 159/77 pulse 49 Sitting blood pressure 161/71 pulse 54 Standing blood pressure 149/74 pulse 59  BP Readings from Last 3 Encounters:  12/15/19 (!) 163/84  10/14/19 (!) 140/80  08/11/19 (!) 177/81   Wt Readings from Last 3 Encounters:  12/15/19 178 lb 9.6 oz (81 kg)  10/14/19 181 lb 3.2 oz (82.2 kg)  08/19/19 169 lb (76.7 kg)    Physical Exam Constitutional:      General: He is not in acute distress.    Appearance: He is not diaphoretic.  Cardiovascular:     Rate and Rhythm: Normal rate and  regular rhythm.     Heart sounds: Normal heart sounds.  Pulmonary:     Effort: Pulmonary effort is normal.     Breath sounds: Normal breath sounds.  Musculoskeletal:     Right lower leg: No edema.     Left lower leg: No edema.  Skin:    General: Skin is warm and dry.  Neurological:     Mental Status: He is alert.      Assessment/Plan: Please see individual problem list.  Dilatation of thoracic aorta (HCC) Stable on recent imaging.  Plan for yearly follow-up on this as recommended in the imaging report.  Essential hypertension Adequate control for age at home.  Continue HCTZ and losartan. He will let us know if it starts to run above 150/90 consistently.   Insomnia Chronic issue.  Stable.  Continue Ambien 10 mg once daily.  Bronchiectasis ([place]) Seen on imaging.  Possibly related to prior infection.  Will refer to pulmonology.    Hearing loss Chronic issue.  Mild worsening.  He does not want to work this up currently.  He will let us know when he is ready to see audiology.  Memory deficit He will continue on Namenda through neurology.  I discussed the purpose of that medication is to slow the decline of his memory issues.   Orders  Placed This Encounter  Procedures  . Flu Vaccine QUAD High Dose(Fluad)  . Ambulatory referral to Pulmonology    Referral Priority:   Routine    Referral Type:   Consultation    Referral Reason:   Specialty Services Required    Requested Specialty:   Pulmonary Disease    Number of Visits Requested:   1    No orders of the defined types were placed in this encounter.   Jayel was seen today for follow-up.  Diagnoses and all orders for this visit:  Need for immunization against influenza -     Flu Vaccine QUAD High Dose(Fluad)  Dilatation of thoracic aorta (HCC)  Essential hypertension  Primary insomnia  Bronchiectasis without complication ([place]) -     Ambulatory referral to Pulmonology  Bilateral hearing loss, unspecified hearing  loss type  Memory deficit     This visit occurred during the SARS-CoV-2 public health emergency.  Safety protocols were in place, including screening questions prior to the visit, additional usage of staff PPE, and extensive cleaning of exam room while observing appropriate contact time as indicated for disinfecting solutions.    Aaron Rumps, MD [place]

## 2019-12-15 NOTE — Patient Instructions (Signed)
Nice to see you. Please continue your current blood pressure medication.  If your blood pressure becomes greater than 150/90 consistently please let us know. We are referring you to pulmonology to evaluate the CT findings in your lungs.

## 2019-12-15 NOTE — Assessment & Plan Note (Addendum)
Adequate control for age at home.  Continue HCTZ and losartan. He will let us know if it starts to run above 150/90 consistently.

## 2019-12-23 ENCOUNTER — Telehealth: Payer: Self-pay | Admitting: Family Medicine

## 2019-12-23 ENCOUNTER — Other Ambulatory Visit: Payer: Self-pay

## 2019-12-23 NOTE — Telephone Encounter (Signed)
Patient called instated that he went to a walkin clinic the week end and the doctor prescribed him some medication for gout and it caused his blood pressure to go up 40 points so he stopped using the medication and it has went down some wanted to know he should do.

## 2019-12-23 NOTE — Telephone Encounter (Signed)
Please find out what symptoms he has been having.  Went to the start?  Have they improved?  What medication today prescribed?  What walk-in clinic to go to?

## 2019-12-23 NOTE — Telephone Encounter (Signed)
Patient called instated that he went to a walkin clinic the week end and the doctor prescribed him some medication for gout and it caused his blood pressure to go up 40 points so he stopped using the medication and it has went down some wanted to know he should do.  Elliannah Wayment,cma

## 2019-12-24 NOTE — Telephone Encounter (Signed)
LVM for the patient to call back. Aaron Quinn,cma  

## 2019-12-25 ENCOUNTER — Encounter: Payer: Self-pay | Admitting: Nurse Practitioner

## 2019-12-25 ENCOUNTER — Ambulatory Visit (INDEPENDENT_AMBULATORY_CARE_PROVIDER_SITE_OTHER): Payer: Medicare Other

## 2019-12-25 ENCOUNTER — Ambulatory Visit: Payer: Medicare Other | Admitting: Nurse Practitioner

## 2019-12-25 ENCOUNTER — Other Ambulatory Visit: Payer: Self-pay

## 2019-12-25 VITALS — BP 170/90 | HR 60 | Temp 98.5°F | Ht 70.0 in | Wt 179.0 lb

## 2019-12-25 DIAGNOSIS — M109 Gout, unspecified: Secondary | ICD-10-CM

## 2019-12-25 DIAGNOSIS — I10 Essential (primary) hypertension: Secondary | ICD-10-CM | POA: Diagnosis not present

## 2019-12-25 NOTE — Progress Notes (Signed)
Established Patient Office Visit  Subjective:  Patient ID: Aaron Quinn, male    DOB: 27-Dec-1931  Age: 86 y.o. MRN: [id number]  CC:  Chief Complaint  Patient presents with  . Acute Visit    hypertension/medication issue    HPI Aaron Quinn is a 86 yo who presents for acute increase in BP on Sat after taking  medication for presumed gout pain in his thumb.  He has a hx of gout in his feet- never had it in his hand.  He has not had gout flare for many years.   He developed bright redness, swelling and pain at the base of his thumb. He went to [facility] and was treated indomethacin 25 mg. He took 3 tablets twice daily on Saturday. He recorded his blood pressure on Saturday night at 183/94. He took only one tablet on Sunday and blood pressure was 150/84.  He had improvement in the thumb, and did take one indomethacin on Monday and Tuesday.  He stopped the indomethacin.  His thumb has continued to improve.  He only has mild redness now, no swelling, and is really not tender.  His home blood pressure has come down to 129/70 at home. Today, at the endocrinology office to get his first Reclast infusion his BP was  BP 120/72-130/80 on Losartan and HCTZ.     HTN: He has taken Losartan 50 mg for years.  He was on HCTZ years ago- and then it was stopped. His BP was elevated 163/84 and Dr. Raeanne Gathers started him on  HCTZ 12.5 mg in Aug.  He does not experience chest pain, shortness of breath, dizziness, lightheadedness, weakness, trouble with his speech, or confusion.  He does not typically take NSAIDs.  He is on citalopram and gabapentin  for fibromyalgia.   Past Medical History:  Diagnosis Date  . Arthritis   . Bladder cancer ([place]) 2018   Hx TURB  . Complication of anesthesia    slow to get rid of anesthesia, over a couple of days.  . Diverticulosis   . Fibromyalgia 1995  . Glaucoma    pt denies  . History of bladder cancer 2018  . History of kidney stones   . Hyperlipidemia   .  Hyperparathyroidism ([place]) 10/2017  . Hypertension   . Positive TB test   . 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 Tubulovillous adenoma of colon     Past Surgical History:  Procedure Laterality Date  . APPENDECTOMY  2005  . CATARACT EXTRACTION, BILATERAL  2012  . COLONOSCOPY W/ BIOPSIES    . CYST EXCISION Right 1950's   thigh  . HEMORRHOID BANDING    . KNEE ARTHROSCOPY Left 2014  . KNEE CARTILAGE SURGERY Left 1951  . NASAL SINUS SURGERY  1950's  . TONSILLECTOMY  1940  . TOTAL HIP ARTHROPLASTY Right 11/13/2017   Procedure: TOTAL HIP ARTHROPLASTY;  Surgeon: Corky Mull, MD;  Location: [facility] ORS;  Service: Orthopedics;  Laterality: Right;  . TRANSURETHRAL RESECTION OF BLADDER TUMOR N/A 02/13/2017   Procedure: TRANSURETHRAL RESECTION OF BLADDER TUMOR (TURBT);  Surgeon: Abbie Sons, MD;  Location: [facility] ORS;  Service: Urology;  Laterality: N/A;  45 minutes needed  . TRANSURETHRAL RESECTION OF BLADDER TUMOR N/A 11/06/2018   Procedure: TRANSURETHRAL RESECTION OF BLADDER TUMOR (TURBT);  Surgeon: Abbie Sons, MD;  Location: [facility] ORS;  Service: Urology;  Laterality: N/A;  . VASECTOMY  1972    Family History  Problem Relation Age of  Onset  . Colon cancer Maternal Grandmother 56       Died at 61  . Heart disease Father   . Arthritis Other        Parent  . Hypertension Other        Parent, grandparent  . Diabetes Son   . Alcohol abuse Son   . Depression Son   . Liver cancer Mother   . Rheum arthritis Mother   . Colon polyps Neg Hx   . Kidney disease Neg Hx   . Esophageal cancer Neg Hx   . Gallbladder disease Neg Hx     Social History   Socioeconomic History  . Marital status: Married    Spouse name: Not on file  . Number of children: 2  . Years of education: Not on file  . Highest education level: Not on file  Occupational History  . Occupation: Retired  Tobacco Use  . Smoking status: Former Smoker    Packs/day: 0.50    Years: 20.00    Pack  years: 10.00    Types: Cigarettes    Quit date: 03/20/1982    Years since quitting: 37.7  . Smokeless tobacco: Never Used  Vaping Use  . Vaping Use: Never used  Substance and Sexual Activity  . Alcohol use: No    Alcohol/week: 0.0 standard drinks  . Drug use: No  . Sexual activity: Yes  Other Topics Concern  . Not on file  Social History Narrative   Retired from [employer] Strain:   . Difficulty of Paying Living Expenses: Not on file  Food Insecurity:   . Worried About Charity fundraiser in the Last Year: Not on file  . Ran Out of Food in the Last Year: Not on file  Transportation Needs:   . Lack of Transportation (Medical): Not on file  . Lack of Transportation (Non-Medical): Not on file  Physical Activity:   . Days of Exercise per Week: Not on file  . Minutes of Exercise per Session: Not on file  Stress:   . Feeling of Stress : Not on file  Social Connections:   . Frequency of Communication with Friends and Family: Not on file  . Frequency of Social Gatherings with Friends and Family: Not on file  . Attends Religious Services: Not on file  . Active Member of Clubs or Organizations: Not on file  . Attends Archivist Meetings: Not on file  . Marital Status: Not on file  Intimate Partner Violence:   . Fear of Current or Ex-Partner: Not on file  . Emotionally Abused: Not on file  . Physically Abused: Not on file  . Sexually Abused: Not on file    Outpatient Medications Prior to Visit  Medication Sig Dispense Refill  . Calcium Carb-Cholecalciferol (CALTRATE 600+D3) 600-800 MG-UNIT TABS Take 1 tablet by mouth 2 (two) times a day.     . cholecalciferol (VITAMIN D) 25 MCG (1000 UT) tablet Take 1,000 Units by mouth daily.    . clotrimazole-betamethasone (LOTRISONE) cream APPLY TO AFFECTED AREA TWICE A DAY 30 g 0  . fluticasone (FLONASE) 50 MCG/ACT nasal spray Place 1 spray into both nostrils daily as  needed for allergies.     Marland Kitchen gabapentin (NEURONTIN) 300 MG capsule Take 400 mg by mouth 3 (three) times daily.    . hydrochlorothiazide (HYDRODIURIL) 12.5 MG tablet Take 1 tablet (12.5 mg total) by mouth daily. 90 tablet  1  . hydrocortisone 2.5 % cream hydrocortisone 2.5 % topical cream  APPLY ONCE A DAY    . ketoconazole (NIZORAL) 2 % shampoo Apply 1 application topically once a week.   2  . loratadine (CLARITIN) 10 MG tablet Take 10 mg by mouth daily.    Marland Kitchen losartan (COZAAR) 50 MG tablet Take 1 tablet (50 mg total) by mouth daily. 90 tablet 1  . memantine (NAMENDA) 5 MG tablet Take 5 mg by mouth 2 (two) times daily.    . NON FORMULARY Peripheral Neuropathy Cream: Bupivacaine 1%/ Doxepin 3%/ Gabapentin 6%/ Pentoxifyline 3%/ topiramate1%  SIG: Apply 1-2 grams to affected area 3-4 times daily 1 refill    . zolpidem (AMBIEN) 10 MG tablet Take 1 tablet (10 mg total) by mouth at bedtime as needed. for sleep 90 tablet 0   No facility-administered medications prior to visit.    No Known Allergies  Review of Systems Pertinent positives none history of present illness otherwise negative.   Objective:    Physical Exam Vitals reviewed.  Constitutional:      Appearance: Normal appearance. He is normal weight.  HENT:     Head: Normocephalic.  Eyes:     Conjunctiva/sclera: Conjunctivae normal.     Pupils: Pupils are equal, round, and reactive to light.  Neck:     Vascular: No carotid bruit.  Cardiovascular:     Rate and Rhythm: Normal rate and regular rhythm.     Pulses: Normal pulses.     Heart sounds: Normal heart sounds.  Pulmonary:     Effort: Pulmonary effort is normal.     Breath sounds: Normal breath sounds.  Abdominal:     Palpations: Abdomen is soft.     Tenderness: There is no abdominal tenderness.  Musculoskeletal:        General: Normal range of motion.     Cervical back: Normal range of motion and neck supple.     Right lower leg: No edema.     Left lower leg: No edema.       Comments: Base of right thumb with mild erythema, no warmth, minimal tender and no swelling. All other finger normal and hand normal. Normal ROM thumb, fingers and hand. No wrist pain.   Skin:    General: Skin is warm and dry.  Neurological:     General: No focal deficit present.     Mental Status: He is alert and oriented to person, place, and time.  Psychiatric:        Mood and Affect: Mood normal.        Behavior: Behavior normal.     BP (!) 170/90 (BP Location: Left Arm, Patient Position: Sitting, Cuff Size: Normal)   Pulse 60   Temp 98.5 F (36.9 C) (Oral)   Ht 5\' 10"  (1.778 m)   Wt 179 lb (81.2 kg)   SpO2 97%   BMI 25.68 kg/m  Wt Readings from Last 3 Encounters:  12/25/19 179 lb (81.2 kg)  12/15/19 178 lb 9.6 oz (81 kg)  10/14/19 181 lb 3.2 oz (82.2 kg)   Pulse Readings from Last 3 Encounters:  12/25/19 60  12/15/19 65  10/14/19 59    BP Readings from Last 3 Encounters:  12/25/19 (!) 170/90  12/15/19 (!) 163/84  10/14/19 (!) 140/80    Lab Results  Component Value Date   CHOL 195 10/24/2018   HDL 42.70 10/24/2018   LDLCALC 117 (H) 10/24/2018   LDLDIRECT 120.0 12/11/2016  TRIG 178.0 (H) 10/24/2018   CHOLHDL 5 10/24/2018      There are no preventive care reminders to display for this patient.  There are no preventive care reminders to display for this patient.  Lab Results  Component Value Date   TSH 4.54 (H) 06/27/2019   Lab Results  Component Value Date   WBC 5.8 06/27/2019   HGB 14.3 06/27/2019   HCT 42.6 06/27/2019   MCV 91.8 06/27/2019   PLT 194.0 06/27/2019   Lab Results  Component Value Date   NA 139 10/27/2019   K 4.4 10/27/2019   CO2 29 10/27/2019   GLUCOSE 128 (H) 10/27/2019   BUN 17 10/27/2019   CREATININE 1.16 10/27/2019   BILITOT 0.5 06/27/2019   ALKPHOS 70 06/27/2019   AST 13 06/27/2019   ALT 9 06/27/2019   PROT 6.7 06/27/2019   ALBUMIN 4.5 06/27/2019   CALCIUM 9.3 10/27/2019   ANIONGAP 3 (L) 11/16/2017   GFR 59.70  (L) 10/27/2019   Lab Results  Component Value Date   CHOL 195 10/24/2018   Lab Results  Component Value Date   HDL 42.70 10/24/2018   Lab Results  Component Value Date   LDLCALC 117 (H) 10/24/2018   Lab Results  Component Value Date   TRIG 178.0 (H) 10/24/2018   Lab Results  Component Value Date   CHOLHDL 5 10/24/2018   Lab Results  Component Value Date   HGBA1C 6.1 10/24/2018      Assessment & Plan:   Problem List Items Addressed This Visit      Cardiovascular and Mediastinum   Essential hypertension     Other   Gout - Primary   Relevant Orders   Uric acid (Completed)   DG Hand Complete Right (Completed)     No orders of the defined types were placed in this encounter.  Patient is concerned about hypertension when he took Indocin indomethacin for the right thumb base pain that he suspects was gout.  The thumb markedly improved.  Blood pressure has returned to normal per his report.  The office blood pressure is elevated.  He was encouraged to rest, we repeated it by me at 186/90 bilat.  Patient does not appear anxious.Wonder if he has a component of white coat syndrome.   Patient reports he has not had a gout flare in his feet for several years.  This is the first time this ever happened in the hand.  He was placed back on HCTZ in August and I am wondering if that may have increased his risk.  Will check uric acid and  x-ray of the hand to make sure there is no other etiology present in the base of his right thumb. Advised to take Tylenol as needed.   This case was discussed with my attending physician, Dr. Nicki Reaper.  Patient was advised to stop the HCTZ.  For blood pressure management, he may increase his losartan 50 mg to twice daily. He has a blood pressure at home and will monitor his readings.  He will need an office visit next week to check his blood pressure and Bmet.  We discussed what causes a elevated blood pressure, and to eat a healthy diet with reduced salt  intake. He was given hypertension handout.   Follow-up: Return in about 1 week (around 01/01/2020).  This visit occurred during the SARS-CoV-2 public health emergency.  Safety protocols were in place, including screening questions prior to the visit, additional usage of staff PPE,  and extensive cleaning of exam room while observing appropriate contact time as indicated for disinfecting solutions.    Denice Paradise, NP

## 2019-12-25 NOTE — Patient Instructions (Addendum)
HOLD the Hydrochlorothiazide Increase the Losartan to morning and bedtime Check your BP and if it gets too low- hold the second dose of Losartan. Office visit next week for BP check.  To the lab today.    Hypertension, Adult High blood pressure (hypertension) is when the force of blood pumping through the arteries is too strong. The arteries are the blood vessels that carry blood from the heart throughout the body. Hypertension forces the heart to work harder to pump blood and may cause arteries to become narrow or stiff. Untreated or uncontrolled hypertension can cause a heart attack, heart failure, a stroke, kidney disease, and other problems. A blood pressure reading consists of a higher number over a lower number. Ideally, your blood pressure should be below 120/80. The first ("top") number is called the systolic pressure. It is a measure of the pressure in your arteries as your heart beats. The second ("bottom") number is called the diastolic pressure. It is a measure of the pressure in your arteries as the heart relaxes. What are the causes? The exact cause of this condition is not known. There are some conditions that result in or are related to high blood pressure. What increases the risk? Some risk factors for high blood pressure are under your control. The following factors may make you more likely to develop this condition:  Smoking.  Having type 2 diabetes mellitus, high cholesterol, or both.  Not getting enough exercise or physical activity.  Being overweight.  Having too much fat, sugar, calories, or salt (sodium) in your diet.  Drinking too much alcohol. Some risk factors for high blood pressure may be difficult or impossible to change. Some of these factors include:  Having chronic kidney disease.  Having a family history of high blood pressure.  Age. Risk increases with age.  Race. You may be at higher risk if you are African American.  Gender. Men are at higher  risk than women before age 7. After age 23, women are at higher risk than men.  Having obstructive sleep apnea.  Stress. What are the signs or symptoms? High blood pressure may not cause symptoms. Very high blood pressure (hypertensive crisis) may cause:  Headache.  Anxiety.  Shortness of breath.  Nosebleed.  Nausea and vomiting.  Vision changes.  Severe chest pain.  Seizures. How is this diagnosed? This condition is diagnosed by measuring your blood pressure while you are seated, with your arm resting on a flat surface, your legs uncrossed, and your feet flat on the floor. The cuff of the blood pressure monitor will be placed directly against the skin of your upper arm at the level of your heart. It should be measured at least twice using the same arm. Certain conditions can cause a difference in blood pressure between your right and left arms. Certain factors can cause blood pressure readings to be lower or higher than normal for a short period of time:  When your blood pressure is higher when you are in a health care provider's office than when you are at home, this is called white coat hypertension. Most people with this condition do not need medicines.  When your blood pressure is higher at home than when you are in a health care provider's office, this is called masked hypertension. Most people with this condition may need medicines to control blood pressure. If you have a high blood pressure reading during one visit or you have normal blood pressure with other risk factors, you may  be asked to:  Return on a different day to have your blood pressure checked again.  Monitor your blood pressure at home for 1 week or longer. If you are diagnosed with hypertension, you may have other blood or imaging tests to help your health care provider understand your overall risk for other conditions. How is this treated? This condition is treated by making healthy lifestyle changes, such  as eating healthy foods, exercising more, and reducing your alcohol intake. Your health care provider may prescribe medicine if lifestyle changes are not enough to get your blood pressure under control, and if:  Your systolic blood pressure is above 130.  Your diastolic blood pressure is above 80. Your personal target blood pressure may vary depending on your medical conditions, your age, and other factors. Follow these instructions at home: Eating and drinking   Eat a diet that is high in fiber and potassium, and low in sodium, added sugar, and fat. An example eating plan is called the DASH (Dietary Approaches to Stop Hypertension) diet. To eat this way: ? Eat plenty of fresh fruits and vegetables. Try to fill one half of your plate at each meal with fruits and vegetables. ? Eat whole grains, such as whole-wheat pasta, brown rice, or whole-grain bread. Fill about one fourth of your plate with whole grains. ? Eat or drink low-fat dairy products, such as skim milk or low-fat yogurt. ? Avoid fatty cuts of meat, processed or cured meats, and poultry with skin. Fill about one fourth of your plate with lean proteins, such as fish, chicken without skin, beans, eggs, or tofu. ? Avoid pre-made and processed foods. These tend to be higher in sodium, added sugar, and fat.  Reduce your daily sodium intake. Most people with hypertension should eat less than 1,500 mg of sodium a day.  Do not drink alcohol if: ? Your health care provider tells you not to drink. ? You are pregnant, may be pregnant, or are planning to become pregnant.  If you drink alcohol: ? Limit how much you use to:  0-1 drink a day for women.  0-2 drinks a day for men. ? Be aware of how much alcohol is in your drink. In the [place] one drink equals one 12 oz bottle of beer (355 mL), one 5 oz glass of wine (148 mL), or one 1 oz glass of hard liquor (44 mL). Lifestyle   Work with your health care provider to maintain a healthy body  weight or to lose weight. Ask what an ideal weight is for you.  Get at least 30 minutes of exercise most days of the week. Activities may include walking, swimming, or biking.  Include exercise to strengthen your muscles (resistance exercise), such as Pilates or lifting weights, as part of your weekly exercise routine. Try to do these types of exercises for 30 minutes at least 3 days a week.  Do not use any products that contain nicotine or tobacco, such as cigarettes, e-cigarettes, and chewing tobacco. If you need help quitting, ask your health care provider.  Monitor your blood pressure at home as told by your health care provider.  Keep all follow-up visits as told by your health care provider. This is important. Medicines  Take over-the-counter and prescription medicines only as told by your health care provider. Follow directions carefully. Blood pressure medicines must be taken as prescribed.  Do not skip doses of blood pressure medicine. Doing this puts you at risk for problems and can make  the medicine less effective.  Ask your health care provider about side effects or reactions to medicines that you should watch for. Contact a health care provider if you:  Think you are having a reaction to a medicine you are taking.  Have headaches that keep coming back (recurring).  Feel dizzy.  Have swelling in your ankles.  Have trouble with your vision. Get help right away if you:  Develop a severe headache or confusion.  Have unusual weakness or numbness.  Feel faint.  Have severe pain in your chest or abdomen.  Vomit repeatedly.  Have trouble breathing. Summary  Hypertension is when the force of blood pumping through your arteries is too strong. If this condition is not controlled, it may put you at risk for serious complications.  Your personal target blood pressure may vary depending on your medical conditions, your age, and other factors. For most people, a normal  blood pressure is less than 120/80.  Hypertension is treated with lifestyle changes, medicines, or a combination of both. Lifestyle changes include losing weight, eating a healthy, low-sodium diet, exercising more, and limiting alcohol. This information is not intended to replace advice given to you by your health care provider. Make sure you discuss any questions you have with your health care provider. Document Revised: 11/14/2017 Document Reviewed: 11/14/2017 Elsevier Patient Education  [place].  Gout  Gout is a condition that causes painful swelling of the joints. Gout is a type of inflammation of the joints (arthritis). This condition is caused by having too much uric acid in the body. Uric acid is a chemical that forms when the body breaks down substances called purines. Purines are important for building body proteins. When the body has too much uric acid, sharp crystals can form and build up inside the joints. This causes pain and swelling. Gout attacks can happen quickly and may be very painful (acute gout). Over time, the attacks can affect more joints and become more frequent (chronic gout). Gout can also cause uric acid to build up under the skin and inside the kidneys. What are the causes? This condition is caused by too much uric acid in your blood. This can happen because:  Your kidneys do not remove enough uric acid from your blood. This is the most common cause.  Your body makes too much uric acid. This can happen with some cancers and cancer treatments. It can also occur if your body is breaking down too many red blood cells (hemolytic anemia).  You eat too many foods that are high in purines. These foods include organ meats and some seafood. Alcohol, especially beer, is also high in purines. A gout attack may be triggered by trauma or stress. What increases the risk? You are more likely to develop this condition if you:  Have a family history of gout.  Are male  and middle-aged.  Are male and have gone through menopause.  Are obese.  Frequently drink alcohol, especially beer.  Are dehydrated.  Lose weight too quickly.  Have an organ transplant.  Have lead poisoning.  Take certain medicines, including aspirin, cyclosporine, diuretics, levodopa, and niacin.  Have kidney disease.  Have a skin condition called psoriasis. What are the signs or symptoms? An attack of acute gout happens quickly. It usually occurs in just one joint. The most common place is the big toe. Attacks often start at night. Other joints that may be affected include joints of the feet, ankle, knee, fingers, wrist, or elbow.  Symptoms of this condition may include:  Severe pain.  Warmth.  Swelling.  Stiffness.  Tenderness. The affected joint may be very painful to touch.  Shiny, red, or purple skin.  Chills and fever. Chronic gout may cause symptoms more frequently. More joints may be involved. You may also have white or yellow lumps (tophi) on your hands or feet or in other areas near your joints. How is this diagnosed? This condition is diagnosed based on your symptoms, medical history, and physical exam. You may have tests, such as:  Blood tests to measure uric acid levels.  Removal of joint fluid with a thin needle (aspiration) to look for uric acid crystals.  X-rays to look for joint damage. How is this treated? Treatment for this condition has two phases: treating an acute attack and preventing future attacks. Acute gout treatment may include medicines to reduce pain and swelling, including:  NSAIDs.  Steroids. These are strong anti-inflammatory medicines that can be taken by mouth (orally) or injected into a joint.  Colchicine. This medicine relieves pain and swelling when it is taken soon after an attack. It can be given by mouth or through an IV. Preventive treatment may include:  Daily use of smaller doses of NSAIDs or colchicine.  Use of a  medicine that reduces uric acid levels in your blood.  Changes to your diet. You may need to see a dietitian about what to eat and drink to prevent gout. Follow these instructions at home: During a gout attack   If directed, put ice on the affected area: ? Put ice in a plastic bag. ? Place a towel between your skin and the bag. ? Leave the ice on for 20 minutes, 2-3 times a day.  Raise (elevate) the affected joint above the level of your heart as often as possible.  Rest the joint as much as possible. If the affected joint is in your leg, you may be given crutches to use.  Follow instructions from your health care provider about eating or drinking restrictions. Avoiding future gout attacks  Follow a low-purine diet as told by your dietitian or health care provider. Avoid foods and drinks that are high in purines, including liver, kidney, anchovies, asparagus, herring, mushrooms, mussels, and beer.  Maintain a healthy weight or lose weight if you are overweight. If you want to lose weight, talk with your health care provider. It is important that you do not lose weight too quickly.  Start or maintain an exercise program as told by your health care provider. Eating and drinking  Drink enough fluids to keep your urine pale yellow.  If you drink alcohol: ? Limit how much you use to:  0-1 drink a day for women.  0-2 drinks a day for men. ? Be aware of how much alcohol is in your drink. In the [place] one drink equals one 12 oz bottle of beer (355 mL) one 5 oz glass of wine (148 mL), or one 1 oz glass of hard liquor (44 mL). General instructions  Take over-the-counter and prescription medicines only as told by your health care provider.  Do not drive or use heavy machinery while taking prescription pain medicine.  Return to your normal activities as told by your health care provider. Ask your health care provider what activities are safe for you.  Keep all follow-up visits as told  by your health care provider. This is important. Contact a health care provider if you have:  Another gout attack.  Continuing symptoms of a gout attack after 10 days of treatment.  Side effects from your medicines.  Chills or a fever.  Burning pain when you urinate.  Pain in your lower back or belly. Get help right away if you:  Have severe or uncontrolled pain.  Cannot urinate. Summary  Gout is painful swelling of the joints caused by inflammation.  The most common site of pain is the big toe, but it can affect other joints in the body.  Medicines and dietary changes can help to prevent and treat gout attacks. This information is not intended to replace advice given to you by your health care provider. Make sure you discuss any questions you have with your health care provider. Document Revised: 09/26/2017 Document Reviewed: 09/26/2017 Elsevier Patient Education  [place].

## 2019-12-25 NOTE — Telephone Encounter (Signed)
Patient was seen today by someone in the office today that addressed his issues.  Joane Postel,cma

## 2019-12-26 LAB — URIC ACID: Uric Acid, Serum: 8 mg/dL — ABNORMAL HIGH (ref 4.0–7.8)

## 2019-12-27 ENCOUNTER — Encounter: Payer: Self-pay | Admitting: Nurse Practitioner

## 2019-12-30 ENCOUNTER — Other Ambulatory Visit: Payer: Self-pay

## 2020-01-01 ENCOUNTER — Encounter: Payer: Self-pay | Admitting: Nurse Practitioner

## 2020-01-01 ENCOUNTER — Other Ambulatory Visit: Payer: Self-pay

## 2020-01-01 ENCOUNTER — Ambulatory Visit: Payer: Medicare Other | Admitting: Nurse Practitioner

## 2020-01-01 VITALS — BP 160/80 | HR 68 | Temp 98.3°F | Ht 70.0 in | Wt 178.0 lb

## 2020-01-01 DIAGNOSIS — I10 Essential (primary) hypertension: Secondary | ICD-10-CM

## 2020-01-01 NOTE — Patient Instructions (Addendum)
Please go to the lab today for chemistry panel to see how you are doing on the increased dose of losartan medication for your blood pressure.  Continue to monitor your blood pressures at home just once a day and record.  Share those with Dr. Caryl Bis when you see him again.  Contact a health care provider if:  You think you are having a reaction to medicines you have taken.  You have repeated (recurrent) headaches.  You feel dizzy.  You have swelling in your ankles.  You have trouble with your vision. Get help right away if:  You develop a severe headache or confusion.  You have unusual weakness or numbness, or you feel faint.  You have severe pain in your chest or abdomen.  You vomit repeatedly.  You have trouble breathing.    What changes can I make to manage my hypertension? Hypertension can be managed by making lifestyle changes and possibly by taking medicines. Your health care provider will help you make a plan to bring your blood pressure within a normal range. Eating and drinking   Eat a diet that is high in fiber and potassium, and low in salt (sodium), added sugar, and fat. An example eating plan is called the DASH (Dietary Approaches to Stop Hypertension) diet. To eat this way: ? Eat plenty of fresh fruits and vegetables. Try to fill half of your plate at each meal with fruits and vegetables. ? Eat whole grains, such as whole wheat pasta, brown rice, or whole grain bread. Fill about one quarter of your plate with whole grains. ? Eat low-fat diary products. ? Avoid fatty cuts of meat, processed or cured meats, and poultry with skin. Fill about one quarter of your plate with lean proteins such as fish, chicken without skin, beans, eggs, and tofu. ? Avoid premade and processed foods. These tend to be higher in sodium, added sugar, and fat.  Reduce your daily sodium intake. Most people with hypertension should eat less than 1,500 mg of sodium a day.  Limit alcohol  intake to no more than 1 drink a day for nonpregnant women and 2 drinks a day for men. One drink equals 12 oz of beer, 5 oz of wine, or 1 oz of hard liquor. Lifestyle  Work with your health care provider to maintain a healthy body weight, or to lose weight. Ask what an ideal weight is for you.  Get at least 30 minutes of exercise that causes your heart to beat faster (aerobic exercise) most days of the week. Activities may include walking, swimming, or biking.  Include exercise to strengthen your muscles (resistance exercise), such as weight lifting, as part of your weekly exercise routine. Try to do these types of exercises for 30 minutes at least 3 days a week.  Do not use any products that contain nicotine or tobacco, such as cigarettes and e-cigarettes. If you need help quitting, ask your health care provider.  Control any long-term (chronic) conditions you have, such as high cholesterol or diabetes. Monitoring  Monitor your blood pressure at home as told by your health care provider. Your personal target blood pressure may vary depending on your medical conditions, your age, and other factors.  Have your blood pressure checked regularly, as often as told by your health care provider. Working with your health care provider  Review all the medicines you take with your health care provider because there may be side effects or interactions.  Talk with your health care provider  about your diet, exercise habits, and other lifestyle factors that may be contributing to hypertension.  Visit your health care provider regularly. Your health care provider can help you create and adjust your plan for managing hypertension.

## 2020-01-01 NOTE — Progress Notes (Signed)
Established Patient Office Visit  Subjective:  Patient ID: Aaron Quinn, male    DOB: 1931/08/03  Age: 86 y.o. MRN: [id number]  CC:  Chief Complaint  Patient presents with  . Follow-up    hypertension    HPI AARO MEYERS presents for hypertension follow-up.  He is taking losartan 50 mg twice daily.  He is off the HCTZ secondary to presumed gout in the right thumb joint.  The thumb is resolved.  He checks his blood pressures at home and they have a wide variability.  See the record review below.  He feels well.  He has no headaches, chest pain,  shortness of breath,  or DOE.  He has no gout concerns.  Uric acid level was 8 with normal 4.0-7.8.  BP Readings from Last 3 Encounters:  01/01/20 (!) 160/80  12/25/19 (!) 170/90  12/15/19 (!) 163/84    Past Medical History:  Diagnosis Date  . Arthritis   . Bladder cancer ([place]) 2018   Hx TURB  . Complication of anesthesia    slow to get rid of anesthesia, over a couple of days.  . Diverticulosis   . Fibromyalgia 1995  . Glaucoma    pt denies  . History of bladder cancer 2018  . History of kidney stones   . Hyperlipidemia   . Hyperparathyroidism ([place]) 10/2017  . Hypertension   . Positive TB test   . 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 Tubulovillous adenoma of colon     Past Surgical History:  Procedure Laterality Date  . APPENDECTOMY  2005  . CATARACT EXTRACTION, BILATERAL  2012  . COLONOSCOPY W/ BIOPSIES    . CYST EXCISION Right 1950's   thigh  . HEMORRHOID BANDING    . KNEE ARTHROSCOPY Left 2014  . KNEE CARTILAGE SURGERY Left 1951  . NASAL SINUS SURGERY  1950's  . TONSILLECTOMY  1940  . TOTAL HIP ARTHROPLASTY Right 11/13/2017   Procedure: TOTAL HIP ARTHROPLASTY;  Surgeon: Corky Mull, MD;  Location: [facility] ORS;  Service: Orthopedics;  Laterality: Right;  . TRANSURETHRAL RESECTION OF BLADDER TUMOR N/A 02/13/2017   Procedure: TRANSURETHRAL RESECTION OF BLADDER TUMOR (TURBT);  Surgeon: Abbie Sons, MD;  Location: [facility];  Service: Urology;  Laterality: N/A;  45 minutes needed  . TRANSURETHRAL RESECTION OF BLADDER TUMOR N/A 11/06/2018   Procedure: TRANSURETHRAL RESECTION OF BLADDER TUMOR (TURBT);  Surgeon: Abbie Sons, MD;  Location: [facility];  Service: Urology;  Laterality: N/A;  . VASECTOMY  1972    Family History  Problem Relation Age of Onset  . Colon cancer Maternal Grandmother 91       Died at 13  . Heart disease Father   . Arthritis Other        Parent  . Hypertension Other        Parent, grandparent  . Diabetes Son   . Alcohol abuse Son   . Depression Son   . Liver cancer Mother   . Rheum arthritis Mother   . Colon polyps Neg Hx   . Kidney disease Neg Hx   . Esophageal cancer Neg Hx   . Gallbladder disease Neg Hx     Social History   Socioeconomic History  . Marital status: Married    Spouse name: Not on file  . Number of children: 2  . Years of education: Not on file  . Highest education level: Not on file  Occupational History  . Occupation:  Retired  Tobacco Use  . Smoking status: Former Smoker    Packs/day: 0.50    Years: 20.00    Pack years: 10.00    Types: Cigarettes    Quit date: 03/20/1982    Years since quitting: 37.8  . Smokeless tobacco: Never Used  Vaping Use  . Vaping Use: Never used  Substance and Sexual Activity  . Alcohol use: No    Alcohol/week: 0.0 standard drinks  . Drug use: No  . Sexual activity: Yes  Other Topics Concern  . Not on file  Social History Narrative   Retired from [employer] Strain:   . Difficulty of Paying Living Expenses: Not on file  Food Insecurity:   . Worried About Charity fundraiser in the Last Year: Not on file  . Ran Out of Food in the Last Year: Not on file  Transportation Needs:   . Lack of Transportation (Medical): Not on file  . Lack of Transportation (Non-Medical): Not on file  Physical Activity:   . Days of Exercise  per Week: Not on file  . Minutes of Exercise per Session: Not on file  Stress:   . Feeling of Stress : Not on file  Social Connections:   . Frequency of Communication with Friends and Family: Not on file  . Frequency of Social Gatherings with Friends and Family: Not on file  . Attends Religious Services: Not on file  . Active Member of Clubs or Organizations: Not on file  . Attends Archivist Meetings: Not on file  . Marital Status: Not on file  Intimate Partner Violence:   . Fear of Current or Ex-Partner: Not on file  . Emotionally Abused: Not on file  . Physically Abused: Not on file  . Sexually Abused: Not on file    Outpatient Medications Prior to Visit  Medication Sig Dispense Refill  . Calcium Carb-Cholecalciferol (CALTRATE 600+D3) 600-800 MG-UNIT TABS Take 1 tablet by mouth 2 (two) times a day.     . cholecalciferol (VITAMIN D) 25 MCG (1000 UT) tablet Take 1,000 Units by mouth daily.    . clotrimazole-betamethasone (LOTRISONE) cream APPLY TO AFFECTED AREA TWICE A DAY 30 g 0  . fluticasone (FLONASE) 50 MCG/ACT nasal spray Place 1 spray into both nostrils daily as needed for allergies.     Marland Kitchen gabapentin (NEURONTIN) 300 MG capsule Take 400 mg by mouth 3 (three) times daily.    Marland Kitchen ketoconazole (NIZORAL) 2 % shampoo Apply 1 application topically once a week.   2  . losartan (COZAAR) 50 MG tablet Take 1 tablet (50 mg total) by mouth daily. 90 tablet 1  . memantine (NAMENDA) 5 MG tablet Take 5 mg by mouth 2 (two) times daily.    . NON FORMULARY Peripheral Neuropathy Cream: Bupivacaine 1%/ Doxepin 3%/ Gabapentin 6%/ Pentoxifyline 3%/ topiramate1%  SIG: Apply 1-2 grams to affected area 3-4 times daily 1 refill    . zolpidem (AMBIEN) 10 MG tablet Take 1 tablet (10 mg total) by mouth at bedtime as needed. for sleep 90 tablet 0  . hydrochlorothiazide (HYDRODIURIL) 12.5 MG tablet Take 1 tablet (12.5 mg total) by mouth daily. 90 tablet 1  . hydrocortisone 2.5 % cream hydrocortisone  2.5 % topical cream  APPLY ONCE A DAY    . loratadine (CLARITIN) 10 MG tablet Take 10 mg by mouth daily.     No facility-administered medications prior to visit.  No Known Allergies  Review of Systems Pertinent positives noted in history of present illness and otherwise negative.   Objective:    Physical Exam Vitals reviewed.  Constitutional:      Appearance: Normal appearance. He is normal weight.  HENT:     Head: Normocephalic.  Eyes:     Pupils: Pupils are equal, round, and reactive to light.  Cardiovascular:     Rate and Rhythm: Normal rate and regular rhythm.     Pulses: Normal pulses.     Heart sounds: Normal heart sounds.  Pulmonary:     Effort: Pulmonary effort is normal.  Musculoskeletal:     Cervical back: Normal range of motion and neck supple.     Right lower leg: No edema.     Left lower leg: No edema.  Skin:    General: Skin is warm and dry.  Neurological:     General: No focal deficit present.     Mental Status: He is alert and oriented to person, place, and time.  Psychiatric:        Mood and Affect: Mood normal.        Behavior: Behavior normal.        BP (!) 160/80 (BP Location: Left Arm, Patient Position: Sitting, Cuff Size: Normal)   Pulse 68   Temp 98.3 F (36.8 C) (Oral)   Ht 5\' 10"  (1.778 m)   Wt 178 lb (80.7 kg)   SpO2 96%   BMI 25.54 kg/m  Wt Readings from Last 3 Encounters:  01/01/20 178 lb (80.7 kg)  12/25/19 179 lb (81.2 kg)  12/15/19 178 lb 9.6 oz (81 kg)     There are no preventive care reminders to display for this patient.  There are no preventive care reminders to display for this patient.  Lab Results  Component Value Date   TSH 4.54 (H) 06/27/2019   Lab Results  Component Value Date   WBC 5.8 06/27/2019   HGB 14.3 06/27/2019   HCT 42.6 06/27/2019   MCV 91.8 06/27/2019   PLT 194.0 06/27/2019   Lab Results  Component Value Date   NA 138 01/01/2020   K 4.7 01/01/2020   CO2 27 01/01/2020   GLUCOSE 98  01/01/2020   BUN 19 01/01/2020   CREATININE 1.15 01/01/2020   BILITOT 0.5 06/27/2019   ALKPHOS 70 06/27/2019   AST 13 06/27/2019   ALT 9 06/27/2019   PROT 6.7 06/27/2019   ALBUMIN 4.5 06/27/2019   CALCIUM 9.3 01/01/2020   ANIONGAP 3 (L) 11/16/2017   GFR 57.23 (L) 01/01/2020   Lab Results  Component Value Date   CHOL 195 10/24/2018   Lab Results  Component Value Date   HDL 42.70 10/24/2018   Lab Results  Component Value Date   LDLCALC 117 (H) 10/24/2018   Lab Results  Component Value Date   TRIG 178.0 (H) 10/24/2018   Lab Results  Component Value Date   CHOLHDL 5 10/24/2018   Lab Results  Component Value Date   HGBA1C 6.1 10/24/2018      Assessment & Plan:   Problem List Items Addressed This Visit      Cardiovascular and Mediastinum   Essential hypertension - Primary   Relevant Orders   Basic metabolic panel (Completed)     Please go to the lab today for chemistry panel to see how you are doing on the increased dose of losartan medication for your blood pressure.  Remain on the losartan 50 mg 1  tablet twice daily.  Continue to monitor your blood pressures at home just once a day and record.  Share those with Dr. Caryl Bis when you see him again.  Contact a health care provider if:  You think you are having a reaction to medicines you have taken.  You have repeated (recurrent) headaches.  You feel dizzy.  You have swelling in your ankles.  You have trouble with your vision. Get help right away if:  You develop a severe headache or confusion.  You have unusual weakness or numbness, or you feel faint.  You have severe pain in your chest or abdomen.  You vomit repeatedly.  You have trouble breathing.  No orders of the defined types were placed in this encounter.  He has office visit with Dr. Caryl Bis on 03/15/2020  Follow-up: No follow-ups on file.  This visit occurred during the SARS-CoV-2 public health emergency.  Safety protocols were  in place, including screening questions prior to the visit, additional usage of staff PPE, and extensive cleaning of exam room while observing appropriate contact time as indicated for disinfecting solutions.    Denice Paradise, NP

## 2020-01-02 LAB — BASIC METABOLIC PANEL
BUN: 19 mg/dL (ref 6–23)
CO2: 27 mEq/L (ref 19–32)
Calcium: 9.3 mg/dL (ref 8.4–10.5)
Chloride: 103 mEq/L (ref 96–112)
Creatinine, Ser: 1.15 mg/dL (ref 0.40–1.50)
GFR: 57.23 mL/min — ABNORMAL LOW (ref >60.00)
Glucose, Bld: 98 mg/dL (ref 70–99)
Potassium: 4.7 mEq/L (ref 3.5–5.1)
Sodium: 138 mEq/L (ref 135–145)

## 2020-01-13 ENCOUNTER — Other Ambulatory Visit: Payer: Self-pay | Admitting: Internal Medicine

## 2020-01-13 ENCOUNTER — Ambulatory Visit: Payer: Medicare Other | Attending: Internal Medicine

## 2020-01-13 DIAGNOSIS — Z23 Encounter for immunization: Secondary | ICD-10-CM

## 2020-01-13 NOTE — Progress Notes (Signed)
   Covid-19 Vaccination Clinic  Name:  Aaron Quinn    MRN: [id number] DOB: 18-Feb-1932  01/13/2020  Aaron Quinn was observed post Covid-19 immunization for 15 minutes without incident. He was provided with Vaccine Information Sheet and instruction to access the V-Safe system.   Aaron Quinn was instructed to call 911 with any severe reactions post vaccine: Marland Kitchen Difficulty breathing  . Swelling of face and throat  . A fast heartbeat  . A bad rash all over body  . Dizziness and weakness

## 2020-01-19 ENCOUNTER — Telehealth: Payer: Self-pay

## 2020-01-19 DIAGNOSIS — F5101 Primary insomnia: Secondary | ICD-10-CM

## 2020-01-19 NOTE — Telephone Encounter (Signed)
Last refill 8/11. Next visit 12/27.

## 2020-01-19 NOTE — Telephone Encounter (Signed)
Pt needs a 90 day supply of zolpidem (AMBIEN) 10 MG tablet.

## 2020-01-20 MED ORDER — ZOLPIDEM TARTRATE 10 MG PO TABS: 10.0000 mg | ORAL_TABLET | Freq: Every evening | ORAL | 0 refills | Status: AC | PRN

## 2020-01-20 NOTE — Telephone Encounter (Signed)
Refill sent to pharmacy to be filled on 02/05/2020.  That is 2 days before the refill is due.  Thanks.

## 2020-01-20 NOTE — Addendum Note (Signed)
Addended by: Leone Haven on: 01/20/2020 03:17 PM   Modules accepted: Orders

## 2020-01-26 ENCOUNTER — Encounter: Payer: Self-pay | Admitting: Pulmonary Disease

## 2020-01-26 ENCOUNTER — Other Ambulatory Visit: Payer: Self-pay

## 2020-01-26 ENCOUNTER — Ambulatory Visit (INDEPENDENT_AMBULATORY_CARE_PROVIDER_SITE_OTHER): Payer: Medicare Other | Admitting: Pulmonary Disease

## 2020-01-26 VITALS — BP 154/86 | HR 60 | Temp 97.3°F | Ht 70.0 in | Wt 181.2 lb

## 2020-01-26 DIAGNOSIS — J479 Bronchiectasis, uncomplicated: Secondary | ICD-10-CM

## 2020-01-26 DIAGNOSIS — I712 Thoracic aortic aneurysm, without rupture: Secondary | ICD-10-CM

## 2020-01-26 DIAGNOSIS — I7121 Aneurysm of the ascending aorta, without rupture: Secondary | ICD-10-CM

## 2020-01-26 DIAGNOSIS — R918 Other nonspecific abnormal finding of lung field: Secondary | ICD-10-CM

## 2020-01-26 NOTE — Progress Notes (Signed)
Subjective:    Patient ID: Aaron Quinn, male    DOB: 1931-05-08, 86 y.o.   MRN: [id number]  HPI The patient is an 86 year old remote former smoker (quit 40 years ago) who presents for evaluation of an abnormal chest x-ray showing minimal bronchiectasis, old granulomatous disease and possible evidence of MAI.  The patient is kindly referred by Dr. [name].  Changes were noted on chest CT from April 2021.  Patient has been asymptomatic with regards to fevers, chills or sweats.  No hemoptysis.  No cough or sputum production.  Patient states that he is not short of breath and has never had any pulmonary diagnosis previously.  Reviewing his chest CTs these findings can be seen as far back as 2019.  Suspect these are chronic findings.  The patient does not have a history of significant occupational exposure.  He did reside previously in [place] and [place].  No prior military history and no recent exotic travel.  No unusual hobbies.  No pets in the home.   Review of Systems A 10 point review of systems was performed and it is as noted above otherwise negative.  Past Medical History:  Diagnosis Date  . Arthritis   . Bladder cancer ([place]) 2018   Hx TURB  . Complication of anesthesia    slow to get rid of anesthesia, over a couple of days.  . Diverticulosis   . Fibromyalgia 1995  . Glaucoma    pt denies  . History of bladder cancer 2018  . History of kidney stones   . Hyperlipidemia   . Hyperparathyroidism ([place]) 10/2017  . Hypertension   . Positive TB test   . 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 Tubulovillous adenoma of colon    Past Surgical History:  Procedure Laterality Date  . APPENDECTOMY  2005  . CATARACT EXTRACTION, BILATERAL  2012  . COLONOSCOPY W/ BIOPSIES    . CYST EXCISION Right 1950's   thigh  . HEMORRHOID BANDING    . KNEE ARTHROSCOPY Left 2014  . KNEE CARTILAGE SURGERY Left 1951  . NASAL SINUS SURGERY  1950's  . TONSILLECTOMY  1940  .  TOTAL HIP ARTHROPLASTY Right 11/13/2017   Procedure: TOTAL HIP ARTHROPLASTY;  Surgeon: Corky Mull, MD;  Location: [facility];  Service: Orthopedics;  Laterality: Right;  . TRANSURETHRAL RESECTION OF BLADDER TUMOR N/A 02/13/2017   Procedure: TRANSURETHRAL RESECTION OF BLADDER TUMOR (TURBT);  Surgeon: Abbie Sons, MD;  Location: [facility];  Service: Urology;  Laterality: N/A;  45 minutes needed  . TRANSURETHRAL RESECTION OF BLADDER TUMOR N/A 11/06/2018   Procedure: TRANSURETHRAL RESECTION OF BLADDER TUMOR (TURBT);  Surgeon: Abbie Sons, MD;  Location: [facility];  Service: Urology;  Laterality: N/A;  . VASECTOMY  1972   Patient Active Problem List   Diagnosis Date Noted  . Bronchiectasis ([place]) 12/15/2019  . Hearing loss 12/15/2019  . Bilateral leg edema 06/27/2019  . GERD (gastroesophageal reflux disease) 04/30/2019  . Dilatation of thoracic aorta ([place]) 08/16/2018  . Itching 08/16/2018  . Age-related osteoporosis without current pathological fracture 04/10/2018  . Vitamin D deficiency 04/10/2018  . Myalgia 03/07/2018  . Tremor 03/07/2018  . Primary osteoarthritis of right hip 10/16/2017  . Hyperparathyroidism ([place]) 10/09/2017  . Rash 06/11/2017  . Chronic bilateral low back pain without sciatica 05/17/2017  . History of bladder cancer 02/22/2017  . Sciatica 01/17/2017  . Prediabetes 11/02/2016  . Neuropathy 01/07/2016  . Allergic rhinitis 10/07/2015  .  Insomnia 10/07/2015  . Arthritis 03/10/2015  . Gout 03/10/2015  . H/O varicella 03/10/2015  . HLD (hyperlipidemia) 03/10/2015  . Fibromyalgia 03/09/2015  . Memory deficit 03/09/2015  . Essential hypertension 03/09/2015  . Hemorrhoids, internal, with bleeding 08/12/2014   Social History   Tobacco Use  . Smoking status: Former Smoker    Packs/day: 0.50    Years: 20.00    Pack years: 10.00    Types: Cigarettes    Quit date: 03/20/1982    Years since quitting: 37.8  . Smokeless tobacco: Never Used  Substance Use Topics   . Alcohol use: No    Alcohol/week: 0.0 standard drinks    No Known Allergies  Current Meds  Medication Sig  . Calcium Carb-Cholecalciferol (CALTRATE 600+D3) 600-800 MG-UNIT TABS Take 1 tablet by mouth 2 (two) times a day.   . cholecalciferol (VITAMIN D) 25 MCG (1000 UT) tablet Take 1,000 Units by mouth daily.  . fluticasone (FLONASE) 50 MCG/ACT nasal spray Place 1 spray into both nostrils daily as needed for allergies.   Marland Kitchen gabapentin (NEURONTIN) 300 MG capsule Take 400 mg by mouth 3 (three) times daily.  Marland Kitchen ketoconazole (NIZORAL) 2 % shampoo Apply 1 application topically once a week.   . losartan (COZAAR) 50 MG tablet Take 1 tablet (50 mg total) by mouth daily. (Patient taking differently: Take 50 mg by mouth in the morning and at bedtime. )  . memantine (NAMENDA) 5 MG tablet Take 5 mg by mouth 2 (two) times daily.  . NON FORMULARY Peripheral Neuropathy Cream: Bupivacaine 1%/ Doxepin 3%/ Gabapentin 6%/ Pentoxifyline 3%/ topiramate1%  SIG: Apply 1-2 grams to affected area 3-4 times daily 1 refill  . [START ON 02/05/2020] zolpidem (AMBIEN) 10 MG tablet Take 1 tablet (10 mg total) by mouth at bedtime as needed. for sleep   Immunization History  Administered Date(s) Administered  . Fluad Quad(high Dose 65+) 11/27/2018, 12/15/2019  . Influenza, High Dose Seasonal PF 01/07/2016, 12/20/2016, 01/16/2018  . Influenza-Unspecified 12/18/2014, 12/20/2016  . PFIZER SARS-COV-2 Vaccination 04/03/2019, 04/26/2019, 01/13/2020  . Pneumococcal Conjugate-13 04/20/2017  . Pneumococcal Polysaccharide-23 05/23/2018  . Tdap 04/09/2015, 08/31/2017  . Zoster Recombinat (Shingrix) 12/20/2016, 04/20/2017       Objective:   Physical Exam BP (!) 154/86 (BP Location: Left Arm, Patient Position: Sitting, Cuff Size: Normal)   Pulse 60   Temp (!) 97.3 F (36.3 C) (Temporal)   Ht 5\' 10"  (1.778 m)   Wt 181 lb 3.2 oz (82.2 kg)   SpO2 96%   BMI 26.00 kg/m  GENERAL: Well-developed, well-nourished elderly male  in no acute distress.  Fully ambulatory. HEAD: Normocephalic, atraumatic.  EYES: Pupils equal, round, reactive to light.  No scleral icterus.  MOUTH: Nose/mouth/throat not examined due to masking requirements for COVID 19. NECK: Supple. No thyromegaly. Trachea midline. No JVD.  No adenopathy. PULMONARY: Good air entry bilaterally.  No adventitious sounds. CARDIOVASCULAR: S1 and S2. Regular rate and rhythm.  ABDOMEN: Benign. MUSCULOSKELETAL: No joint deformity, no clubbing, no edema.  NEUROLOGIC: Mild recent memory impairment.  Otherwise benign. SKIN: Intact,warm,dry. PSYCH: Mood and behavior normal.  Representative images from CT scan performed 06/19/2019:        Review of films show that lung findings have been present since at least 2019.  CT also showed ascending thoracic dilation that was stable from prior.  Assessment & Plan:     ICD-10-CM   1. Bronchiectasis without complication ([place])  Y19.5    Findings as far back as 2019 Patient asymptomatic  Old granulomatous disease Possible MAI with no symptoms  2. Thoracic ascending aortic aneurysm ([facility]) - dilation  I71.2    4.4 x 4.3 cm Per last imaging appears stable Will need yearly follow-up Defer to primary/vascular     Discussed the findings with the patient showed films from 2019 and current.  Findings are likely chronic.  Patient is has no symptoms so there is nothing to treat at present.  Findings on the aorta were discussed with the patient defer follow-up to primary MD/vascular.  Follow-up here as needed.  Patient is to advise [place] if he develops issues with cough with changing sputum, night sweats, fevers or hemoptysis.  Renold Don, MD [facility]   *This note was dictated using voice recognition software/Dragon.  Despite best efforts to proofread, errors can occur which can change the meaning.  Any change was purely unintentional.

## 2020-01-26 NOTE — Patient Instructions (Signed)
The findings in your lung are likely chronic.  I do see some changes consistent with that as far back as 2019.  To have no symptoms so there is nothing to treat at this point.  It appears that this has been a prior infection and now has been managed by her immune system.  Recommend yearly follow-up of your aorta with CT scan, Dr. Caryl Bis is following this.   We will see you in follow-up as needed.  Should you develop any issues with night sweats, fevers, or change in the character of your cough to let us know so we can see you at that point.

## 2020-01-27 ENCOUNTER — Telehealth: Payer: Self-pay | Admitting: Family Medicine

## 2020-01-27 NOTE — Telephone Encounter (Signed)
Please get more details on this.  He should not be out of this medication.  Is there a reason he cannot take what is left of the supply that he has?  It is possible that something else was going on that caused him to have difficulty sleeping.  Has he had any changes to his routine or lifestyle?  Was there anything else going on those days that he had difficulty sleeping?

## 2020-01-27 NOTE — Telephone Encounter (Signed)
Patient is taking zolpidem (AMBIEN) 10 MG tablet. Patient stated his last refill had 6 pills that were bad, did not help him sleep. He has never had this problem before. It is not time to refill his prescription. He is requesting 9 pills until he can have his regular prescription filled. Patient said if he does not answer his phone to please leave a detailed message.

## 2020-01-27 NOTE — Telephone Encounter (Signed)
Mychart has been sent to inform patient.

## 2020-01-28 ENCOUNTER — Telehealth: Payer: Self-pay | Admitting: Internal Medicine

## 2020-01-29 ENCOUNTER — Telehealth: Payer: Self-pay | Admitting: Family Medicine

## 2020-01-29 NOTE — Telephone Encounter (Signed)
Patient's death certificate is upfront in Dr. Ellen Henri color folder to be signed.

## 2020-01-29 NOTE — Telephone Encounter (Signed)
Noted. Will await the death certificate. Please see if we have a condolences card we can send his wife.

## 2020-01-29 NOTE — Telephone Encounter (Signed)
Death certificate put in sign basket.  Yetunde Leis,cma

## 2020-01-29 NOTE — Telephone Encounter (Signed)
Received call from officer Lennox Grumbles from [facility] - patient has expired.   He was apparently sitting in the chair and started gasping for air - his wife called 911.       The police said there was nothing obstructing his airway.  The police and then EMS performed CPR.  They were not successful and he expired around 6 pm.  Officer jenkins with the [facility] police department called and states that [facility] will be bringing by pt's death certificate.  Brylei Pedley,cma

## 2020-01-29 NOTE — Telephone Encounter (Signed)
Officer Jenkins with the [employer] called and states that [facility] will be bringing in pt's death certificate for PCP to sign. FYI

## 2020-01-29 NOTE — Telephone Encounter (Signed)
I called and spoke with [facility] and informed them that the death certificate was available for pickup at the front desk.  Cailyn Houdek,cma

## 2020-01-29 NOTE — Telephone Encounter (Signed)
Signed. Please make available for pick up. Please change his status to deceased in the chart.

## 2020-01-29 NOTE — Telephone Encounter (Signed)
See recent phone note. Patient passed away.

## 2020-02-18 ENCOUNTER — Other Ambulatory Visit: Payer: Self-pay | Admitting: Urology

## 2020-02-18 NOTE — Telephone Encounter (Signed)
Received call from officer Lennox Grumbles from [facility] - patient has expired.  He was apparently sitting in the chair and started gasping for air - his wife called 911.     The police said there was nothing obstructing his airway.  The police and then EMS performed CPR.  They were not successful and he expired around 6 pm.

## 2020-02-18 DEATH — deceased

## 2020-03-15 ENCOUNTER — Ambulatory Visit: Payer: Medicare Other | Admitting: Family Medicine

## 2020-04-01 ENCOUNTER — Encounter: Payer: Medicare Other | Admitting: Dermatology

## 2020-04-05 ENCOUNTER — Encounter: Payer: Medicare Other | Admitting: Dermatology

## 2020-08-09 IMAGING — DX DG HIP (WITH OR WITHOUT PELVIS) 2-3V*R*
3 series · 3 of 3 positions shown · non-contrast
Comparison: Coronal and sagittal reconstructed images through the
the pelvis from an abdominal and pelvic CT scan January 24, 2017

CLINICAL DATA: Status post right total hip joint prosthesis
placement.

EXAM:
DG HIP (WITH OR WITHOUT PELVIS) 2-3V RIGHT

[pelvis ap (1 of 2)]
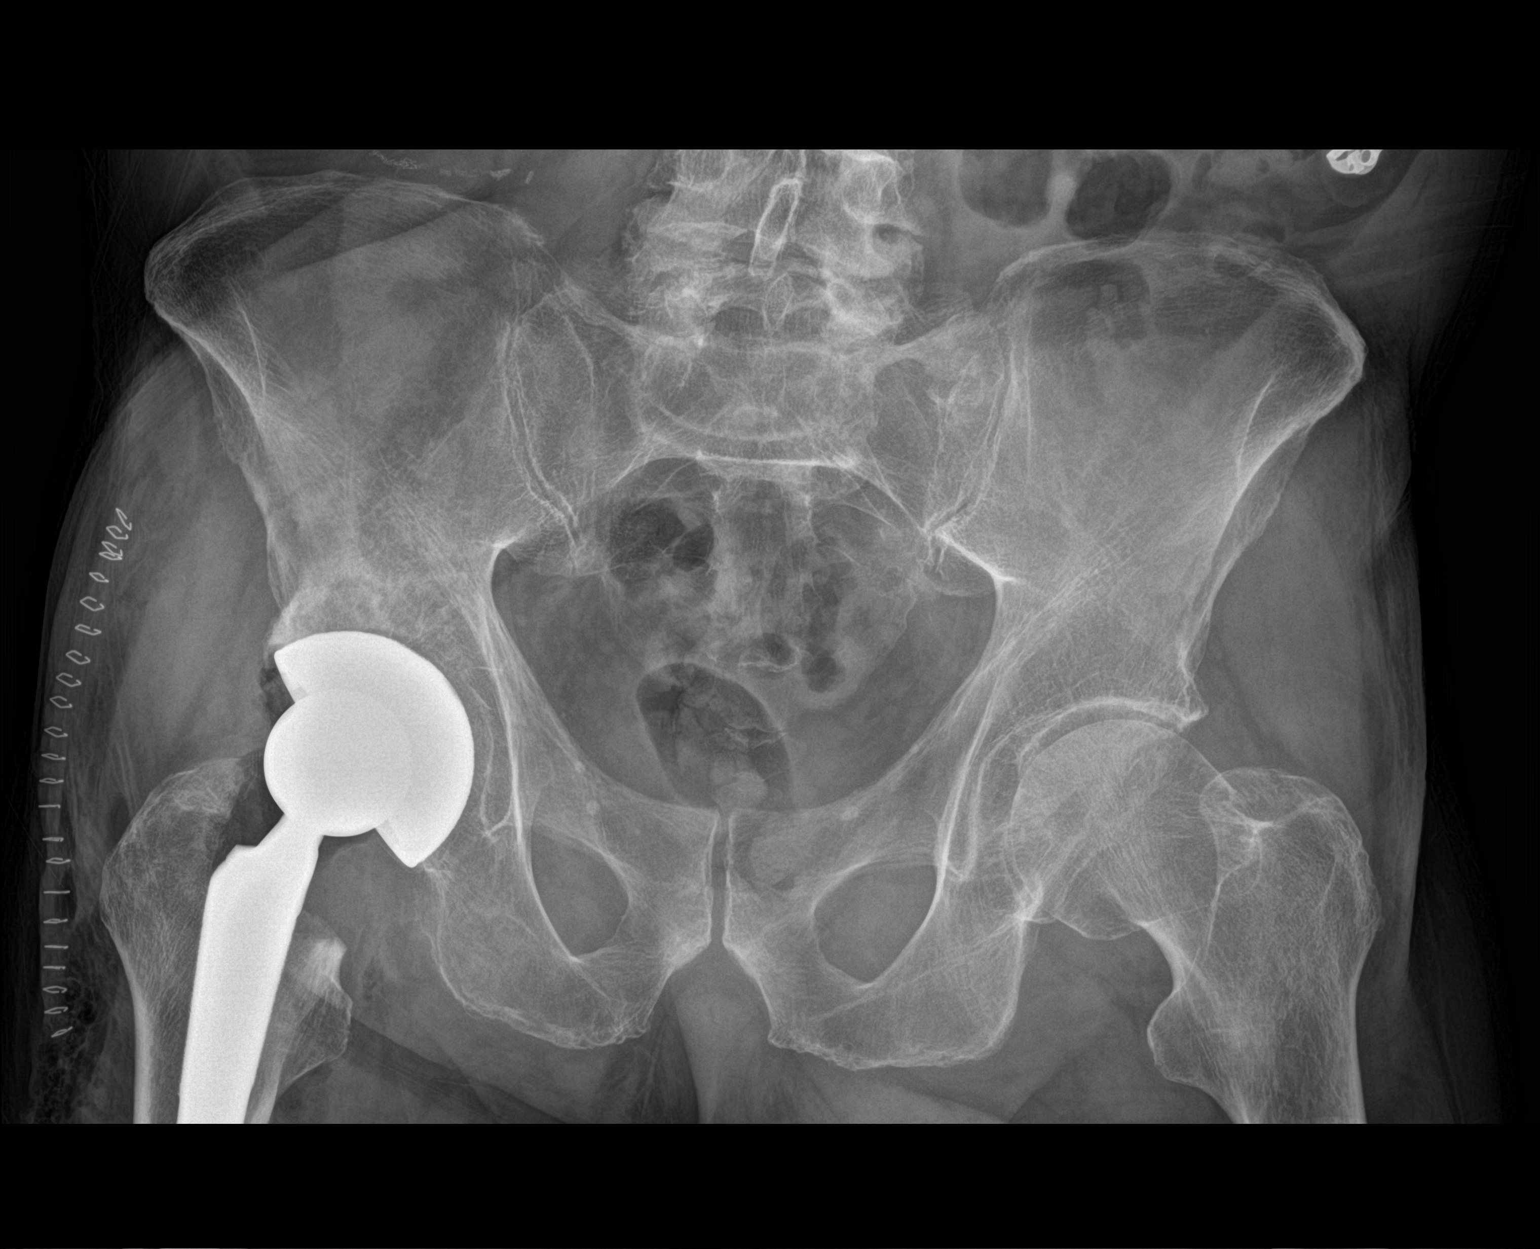

[hip lat]
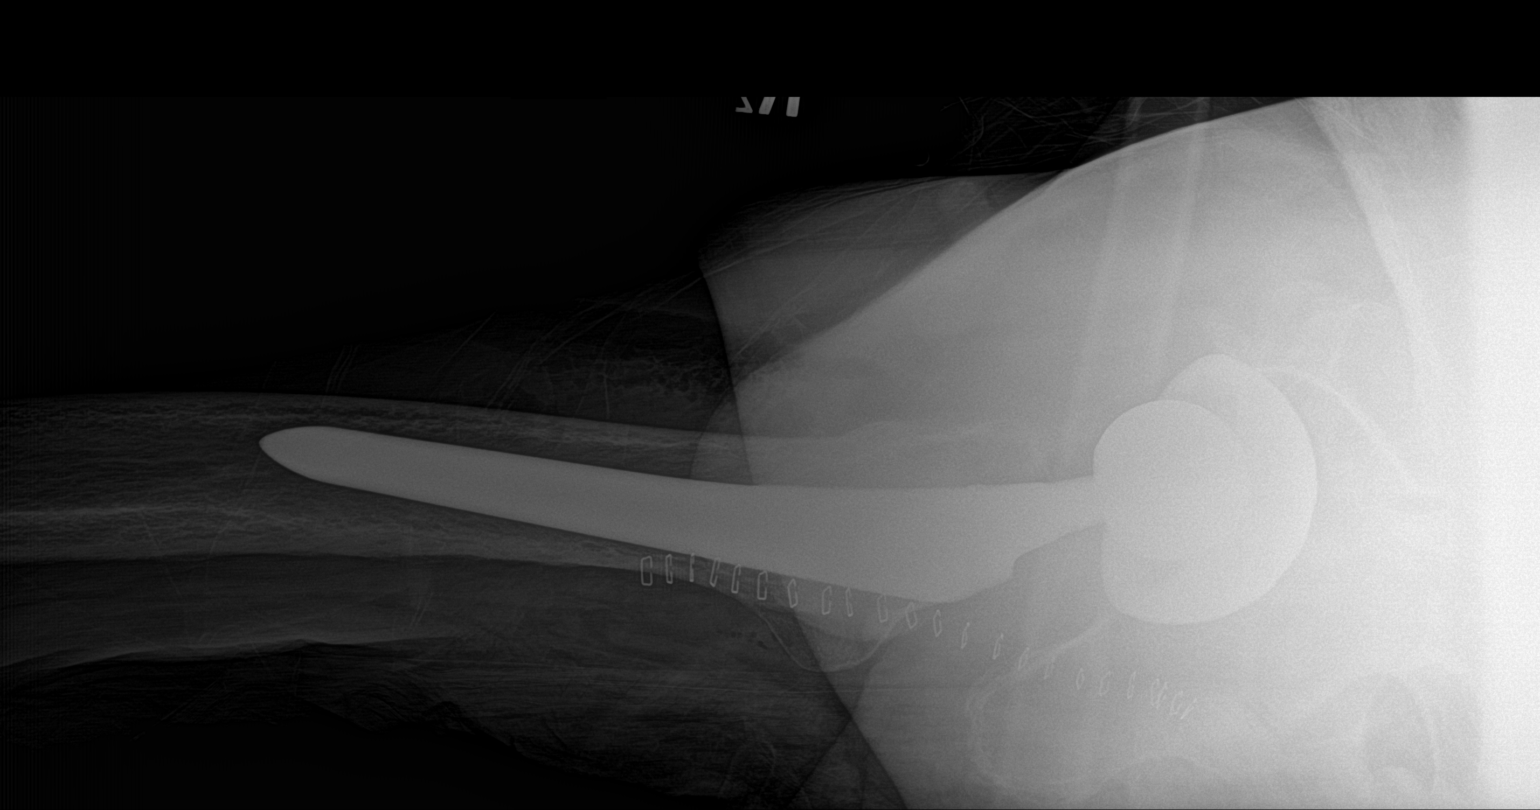

[pelvis ap (2 of 2)]
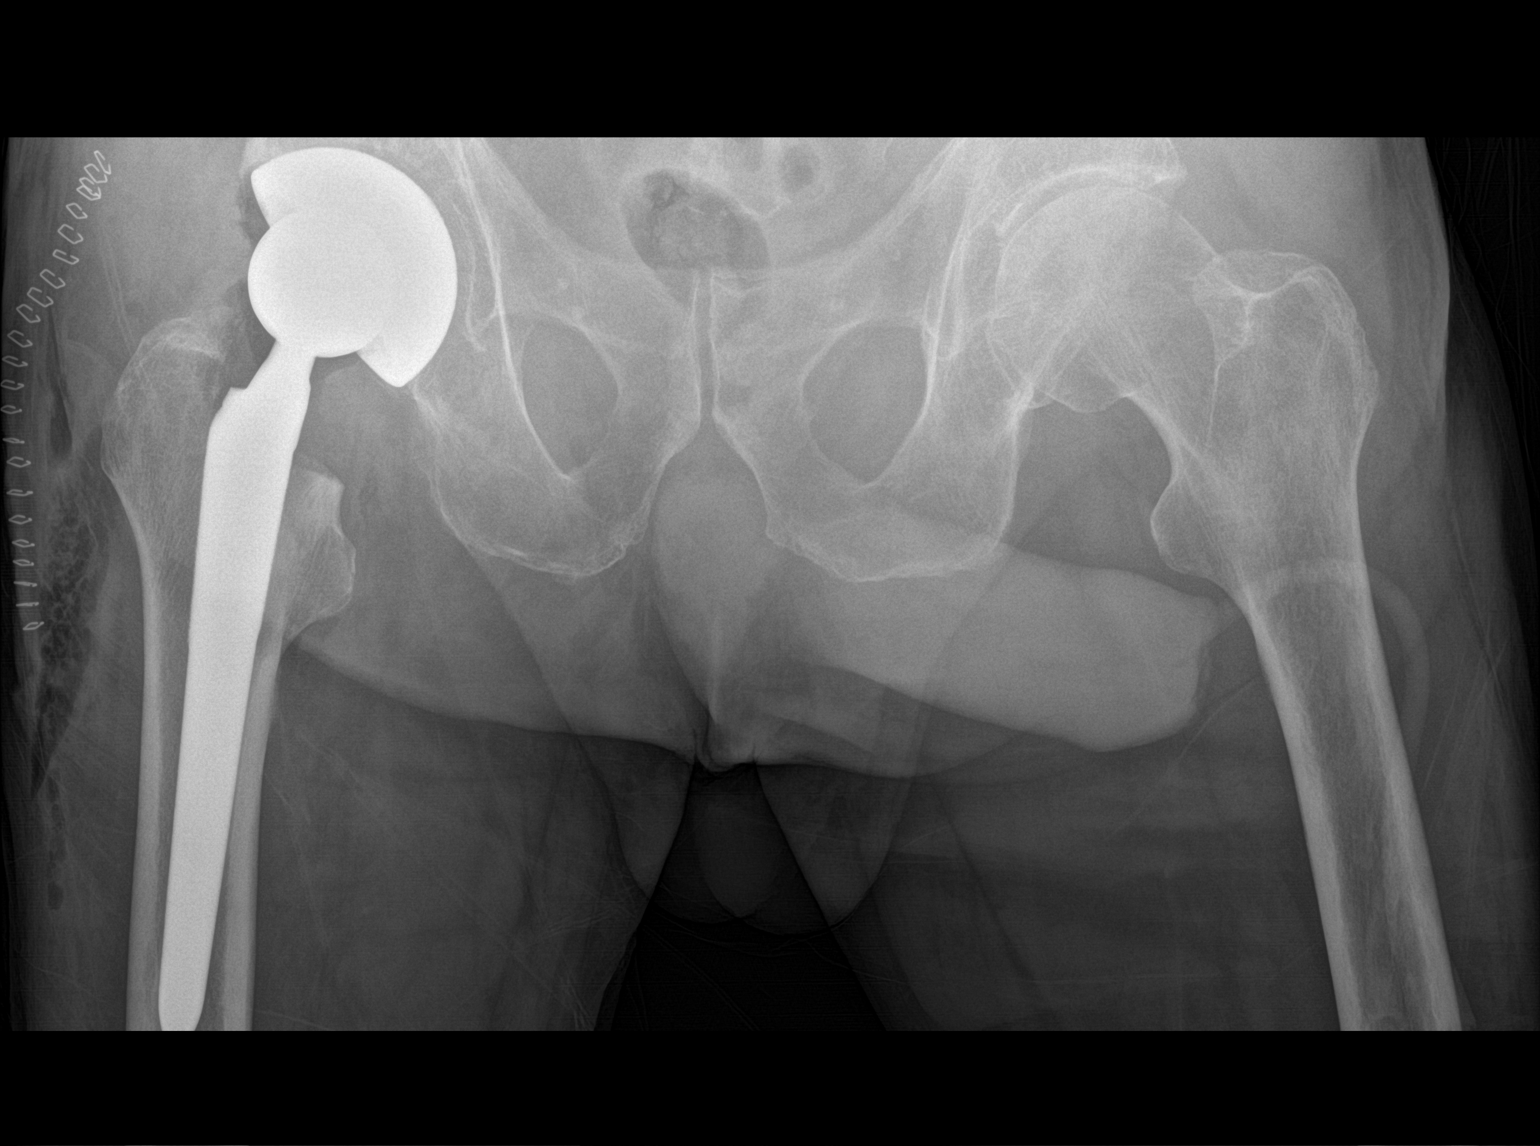

[3 of 3 positions shown; findings below may reference images not displayed]

FINDINGS: The patient has undergone right total hip joint prosthesis
placement. Radiographic positioning of the prosthetic components is
good. The interface with the native bone appears normal. No acute
native bone abnormality is observed.
IMPRESSION: No immediate complication following right total hip joint prosthesis
placement.

## 2020-08-19 ENCOUNTER — Ambulatory Visit: Payer: Medicare Other

## 2020-09-12 IMAGING — US US RENAL
1 series · 14 of 25 positions shown · non-contrast
Comparison: CT 01/24/2017

CLINICAL DATA: Hematuria, proteinuria

EXAM:
RENAL / URINARY TRACT ULTRASOUND COMPLETE

[Series 1: us renal · 0.26mm/px · 14 of 46 slices shown]
[im 1/46]
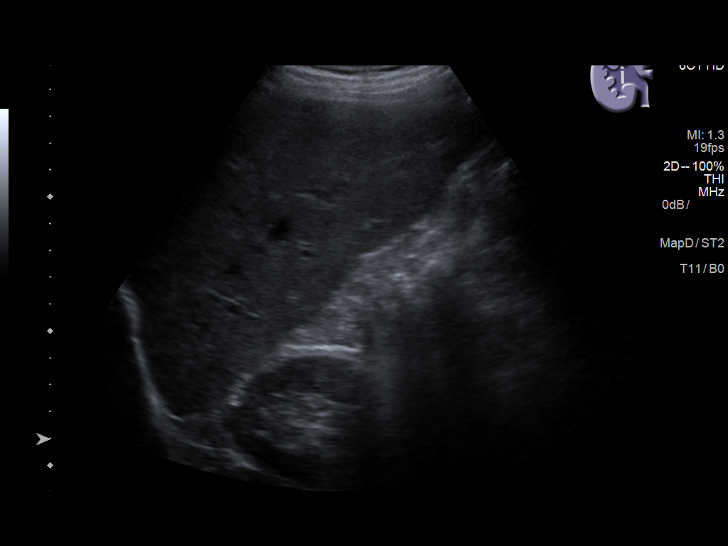
[im 4/46]
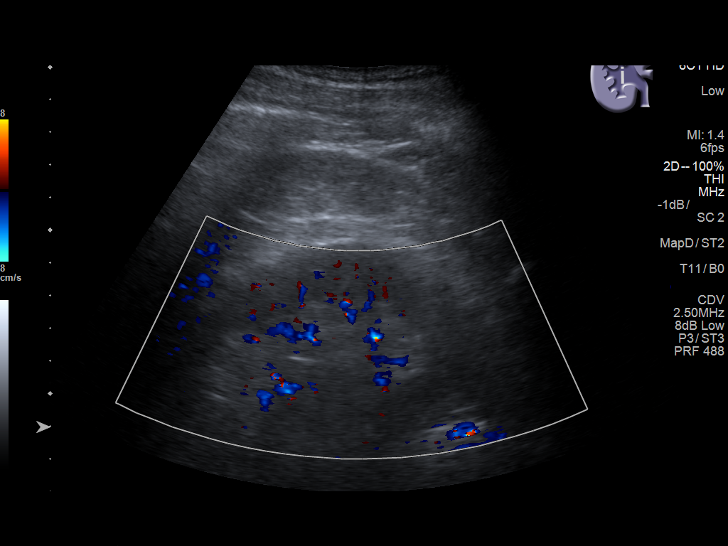
[im 8/46]
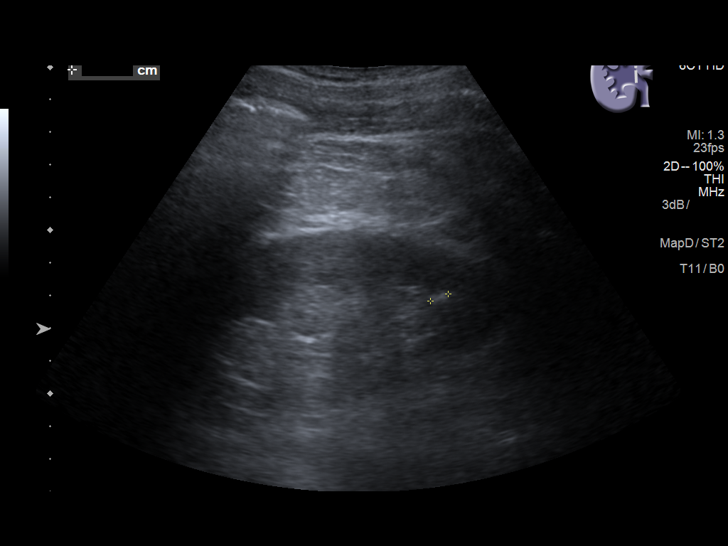
[im 12/46]
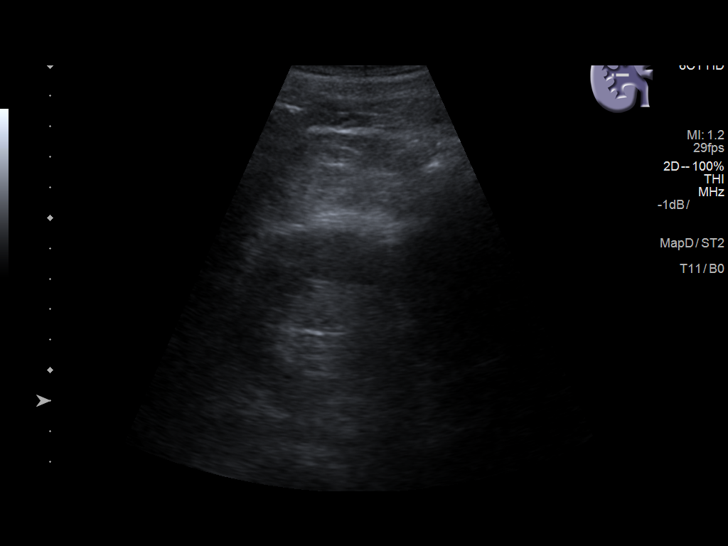
[im 16/46]
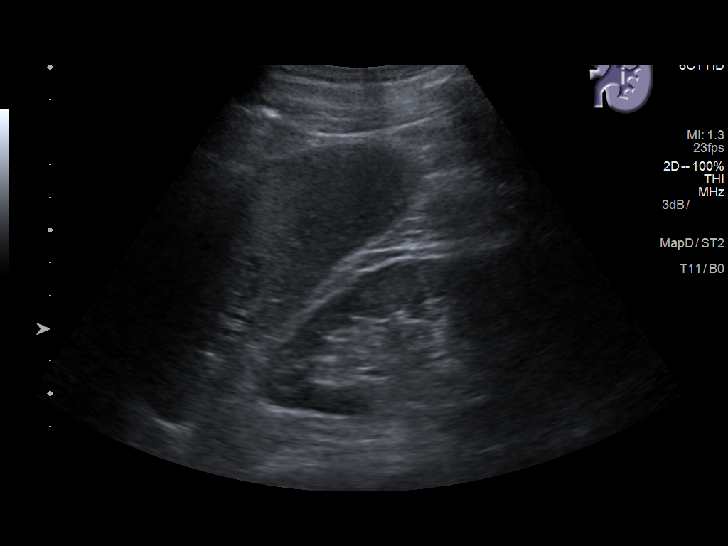
[im 17/46]
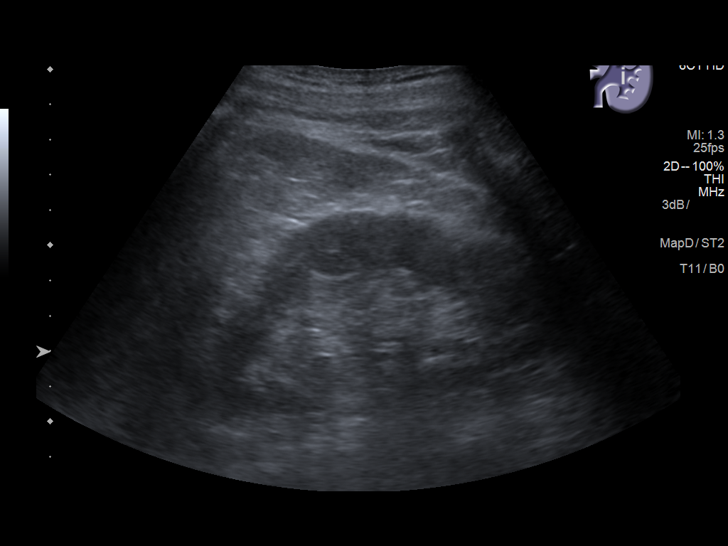
[im 21/46]
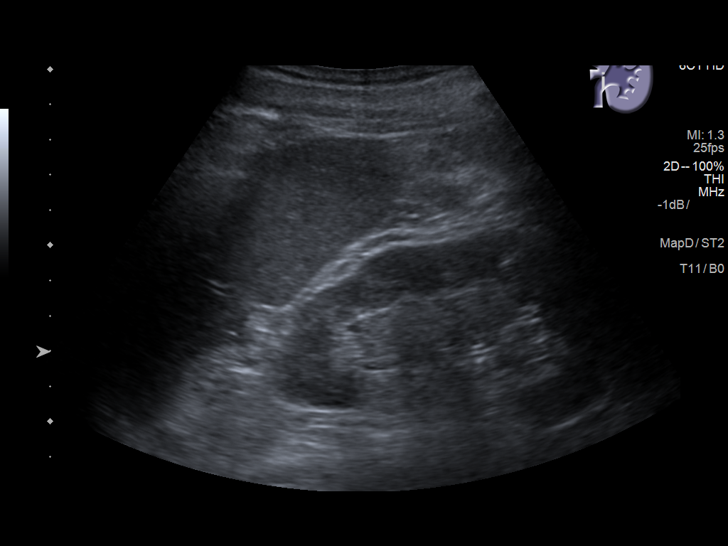
[im 25/46]
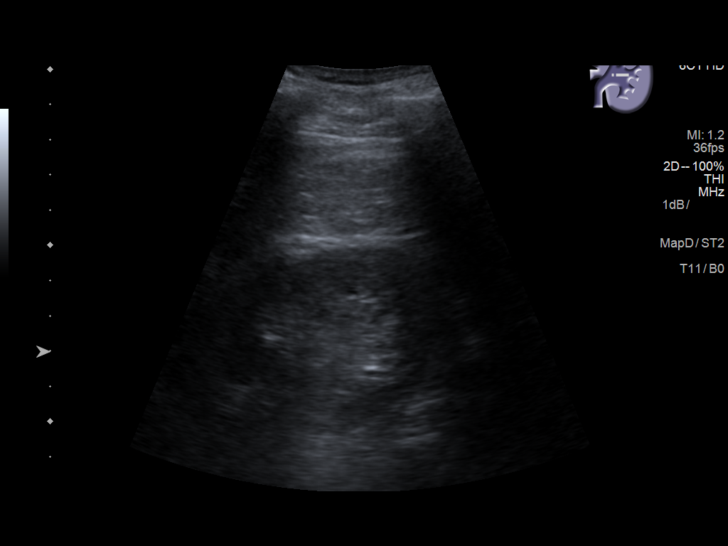
[im 29/46]
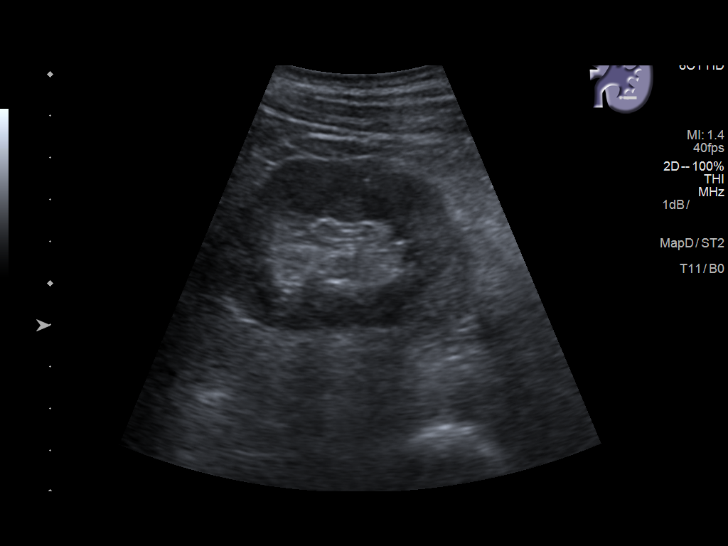
[im 31/46]
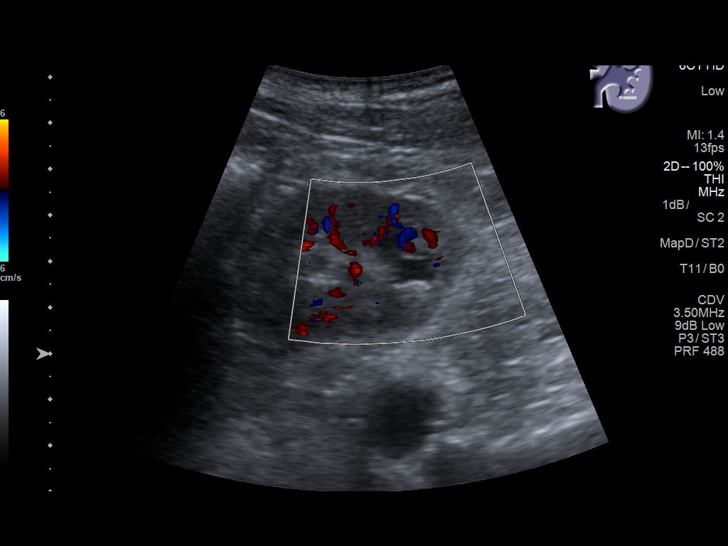
[im 34/46]
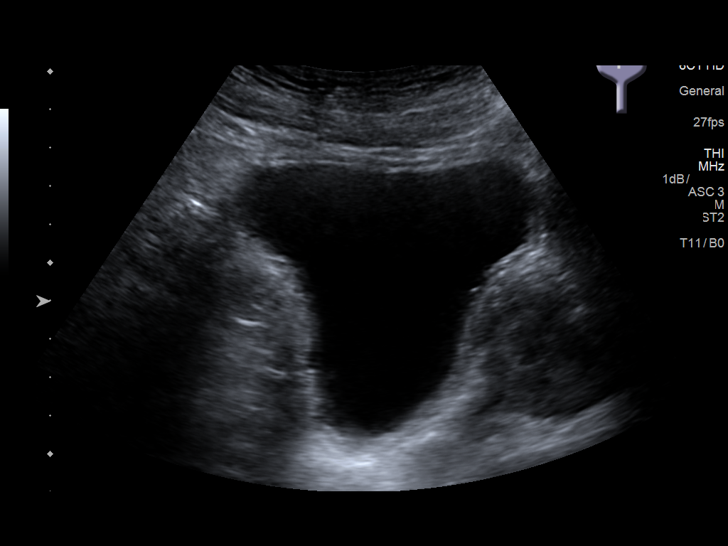
[im 38/46]
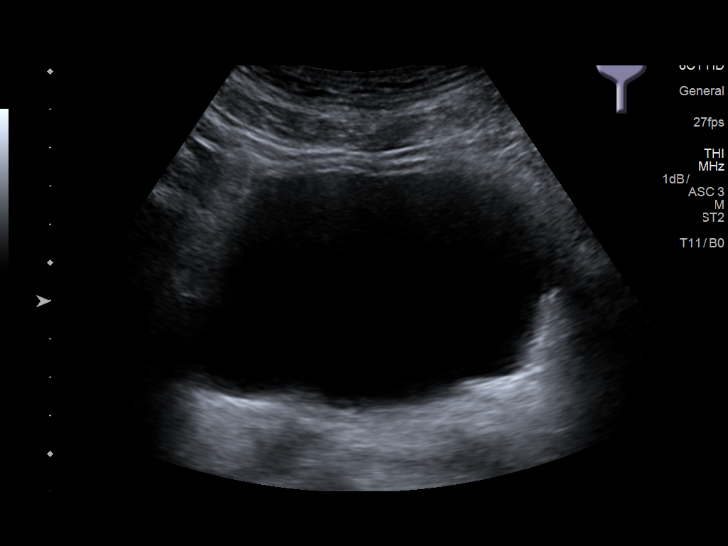
[im 42/46]
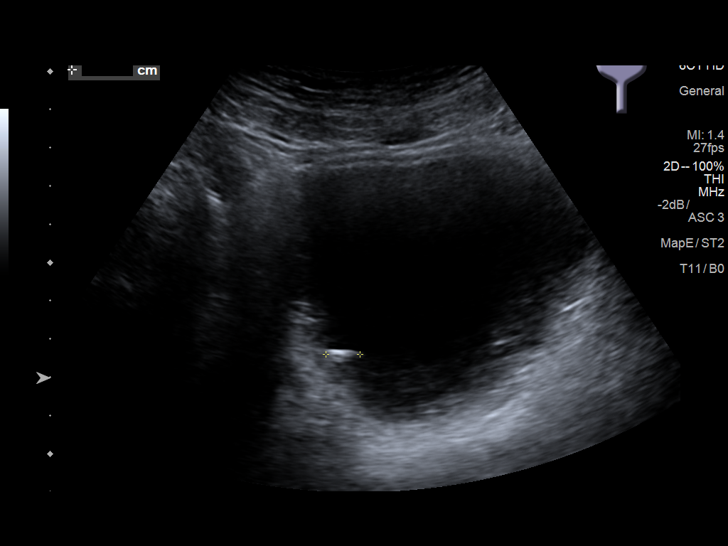
[im 46/46]
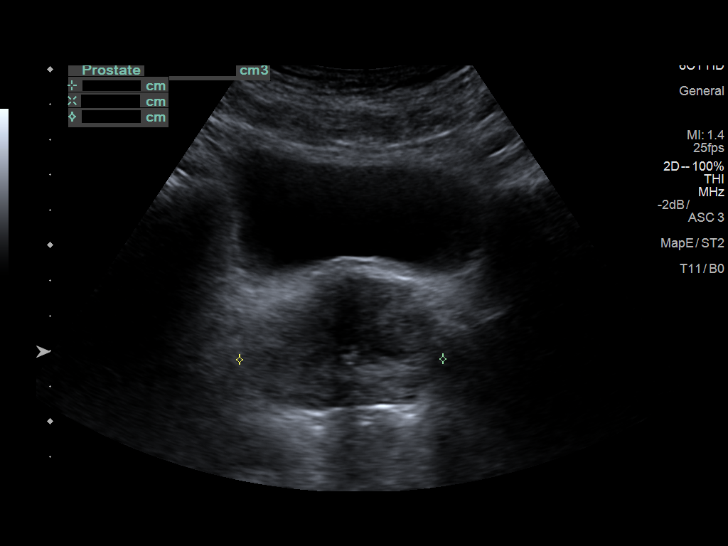

[14 of 25 positions shown; findings below may reference images not displayed]

FINDINGS: Right Kidney:

Renal measurements: 11.5 x 5.2 x 5.7 cm = volume: 179 mL .
Echogenicity within normal limits. No mass or hydronephrosis
visualized.

Left Kidney:

Renal measurements: 11.1 x 5.6 x 5.1 cm = volume: 167 mL. 8 mm cyst
in the lower pole. Normal echotexture. No hydronephrosis.

Bladder:

Nonshadowing echogenic focus noted along the right bladder wall
measures 9 x 7 x 4 mm. This is much smaller than the previously seen
soft tissue mass in this area on prior CT. This is of unknown
etiology, could reflect adherent stone or residual shrunken mass.
Prostate enlargement.
IMPRESSION: Nonshadowing echogenic focus along the right bladder wall measuring
up to 9 mm, much smaller than the soft tissue mass seen on prior CT.
This is of unknown etiology and could reflect adherent stone or
shrunken treated mass.

Prostate enlargement.

No hydronephrosis.

## 2024-02-05 NOTE — Telephone Encounter (Signed)
 Open in error

## 2024-02-05 NOTE — Telephone Encounter (Signed)
 open in error
